# Patient Record
Sex: Male | Born: 1945
Health system: Southern US, Community
[De-identification: ages and names within clinical notes are randomized; demographics above are authoritative.]

## PROBLEM LIST (undated history)

## (undated) DIAGNOSIS — I10 Essential (primary) hypertension: Secondary | ICD-10-CM

## (undated) DIAGNOSIS — D446 Neoplasm of uncertain behavior of carotid body: Secondary | ICD-10-CM

## (undated) DIAGNOSIS — F32A Depression, unspecified: Secondary | ICD-10-CM

## (undated) DIAGNOSIS — E782 Mixed hyperlipidemia: Secondary | ICD-10-CM

## (undated) DIAGNOSIS — K219 Gastro-esophageal reflux disease without esophagitis: Secondary | ICD-10-CM

## (undated) DIAGNOSIS — F4310 Post-traumatic stress disorder, unspecified: Secondary | ICD-10-CM

## (undated) DIAGNOSIS — I2510 Atherosclerotic heart disease of native coronary artery without angina pectoris: Secondary | ICD-10-CM

## (undated) DIAGNOSIS — E119 Type 2 diabetes mellitus without complications: Secondary | ICD-10-CM

## (undated) DIAGNOSIS — F329 Major depressive disorder, single episode, unspecified: Secondary | ICD-10-CM

## (undated) DIAGNOSIS — K2270 Barrett's esophagus without dysplasia: Secondary | ICD-10-CM

## (undated) HISTORY — DX: Essential (primary) hypertension: I10

## (undated) HISTORY — DX: Atherosclerotic heart disease of native coronary artery without angina pectoris: I25.10

## (undated) HISTORY — DX: Gastro-esophageal reflux disease without esophagitis: K21.9

## (undated) HISTORY — DX: Mixed hyperlipidemia: E78.2

## (undated) HISTORY — DX: Neoplasm of uncertain behavior of carotid body: D44.6

## (undated) HISTORY — DX: Barrett's esophagus without dysplasia: K22.70

## (undated) HISTORY — PX: ORTHOPEDIC SURGERY: SHX850

## (undated) HISTORY — DX: Depression, unspecified: F32.A

## (undated) HISTORY — PX: ORIF ANKLE FRACTURE: SUR919

## (undated) HISTORY — PX: CARPAL TUNNEL RELEASE: SHX101

---
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
## 1999-11-08 ENCOUNTER — Encounter (INDEPENDENT_AMBULATORY_CARE_PROVIDER_SITE_OTHER): Payer: Self-pay | Admitting: Specialist

## 1999-11-08 ENCOUNTER — Other Ambulatory Visit: Admission: RE | Admit: 1999-11-08 | Discharge: 1999-11-08 | Payer: Self-pay | Admitting: Gastroenterology

## 2000-11-10 ENCOUNTER — Ambulatory Visit (HOSPITAL_COMMUNITY): Admission: RE | Admit: 2000-11-10 | Discharge: 2000-11-10 | Payer: Self-pay | Admitting: Orthopaedic Surgery

## 2001-05-18 ENCOUNTER — Other Ambulatory Visit: Admission: RE | Admit: 2001-05-18 | Discharge: 2001-05-18 | Payer: Self-pay | Admitting: Gastroenterology

## 2001-05-18 ENCOUNTER — Encounter (INDEPENDENT_AMBULATORY_CARE_PROVIDER_SITE_OTHER): Payer: Self-pay | Admitting: Specialist

## 2002-01-05 ENCOUNTER — Emergency Department (HOSPITAL_COMMUNITY): Admission: EM | Admit: 2002-01-05 | Discharge: 2002-01-05 | Payer: Self-pay | Admitting: Emergency Medicine

## 2003-11-28 ENCOUNTER — Encounter (INDEPENDENT_AMBULATORY_CARE_PROVIDER_SITE_OTHER): Payer: Self-pay | Admitting: *Deleted

## 2004-03-08 ENCOUNTER — Emergency Department (HOSPITAL_COMMUNITY): Admission: EM | Admit: 2004-03-08 | Discharge: 2004-03-08 | Payer: Self-pay | Admitting: Emergency Medicine

## 2006-09-10 ENCOUNTER — Emergency Department (HOSPITAL_COMMUNITY): Admission: EM | Admit: 2006-09-10 | Discharge: 2006-09-10 | Payer: Self-pay | Admitting: Emergency Medicine

## 2006-09-10 ENCOUNTER — Ambulatory Visit: Payer: Self-pay | Admitting: Psychiatry

## 2006-09-11 ENCOUNTER — Inpatient Hospital Stay (HOSPITAL_COMMUNITY): Admission: EM | Admit: 2006-09-11 | Discharge: 2006-09-18 | Payer: Self-pay | Admitting: Psychiatry

## 2006-09-12 ENCOUNTER — Encounter (HOSPITAL_COMMUNITY): Payer: Self-pay | Admitting: Psychiatry

## 2006-09-13 ENCOUNTER — Ambulatory Visit: Payer: Self-pay | Admitting: Vascular Surgery

## 2006-09-30 ENCOUNTER — Encounter (HOSPITAL_COMMUNITY): Admission: RE | Admit: 2006-09-30 | Discharge: 2006-10-30 | Payer: Self-pay | Admitting: Internal Medicine

## 2006-10-05 ENCOUNTER — Ambulatory Visit (HOSPITAL_COMMUNITY): Payer: Self-pay | Admitting: Psychology

## 2006-10-28 ENCOUNTER — Ambulatory Visit (HOSPITAL_COMMUNITY): Payer: Self-pay | Admitting: Psychology

## 2006-11-18 ENCOUNTER — Ambulatory Visit (HOSPITAL_COMMUNITY): Payer: Self-pay | Admitting: Psychology

## 2006-12-01 ENCOUNTER — Ambulatory Visit (HOSPITAL_COMMUNITY): Payer: Self-pay | Admitting: Psychiatry

## 2006-12-17 ENCOUNTER — Ambulatory Visit (HOSPITAL_COMMUNITY): Payer: Self-pay | Admitting: Psychology

## 2007-08-26 ENCOUNTER — Encounter: Payer: Self-pay | Admitting: Gastroenterology

## 2007-09-17 ENCOUNTER — Emergency Department (HOSPITAL_COMMUNITY): Admission: EM | Admit: 2007-09-17 | Discharge: 2007-09-18 | Payer: Self-pay | Admitting: Emergency Medicine

## 2008-02-09 ENCOUNTER — Ambulatory Visit (HOSPITAL_COMMUNITY)
Admission: RE | Admit: 2008-02-09 | Discharge: 2008-02-09 | Payer: Self-pay | Admitting: Adult Reconstructive Orthopaedic Surgery

## 2008-08-14 ENCOUNTER — Inpatient Hospital Stay (HOSPITAL_COMMUNITY): Admission: RE | Admit: 2008-08-14 | Discharge: 2008-08-18 | Payer: Self-pay | Admitting: Orthopedic Surgery

## 2010-08-20 NOTE — Procedures (Signed)
Summary: [facility]  [facility]   Imported By: Lester [place] 10/23/2009 10:14:42  _____________________________________________________________________  External Attachment:    Type:   Image     Comment:   External Document

## 2010-08-23 NOTE — Letter (Signed)
Summary: Recall/[facility]  Recall/[facility]   Imported By: Lester [place] 10/23/2009 10:16:45  _____________________________________________________________________  External Attachment:    Type:   Image     Comment:   External Document

## 2010-11-04 LAB — BASIC METABOLIC PANEL
BUN: 7 mg/dL (ref 6–23)
Calcium: 8.2 mg/dL — ABNORMAL LOW (ref 8.4–10.5)
Creatinine, Ser: 0.97 mg/dL (ref 0.4–1.5)
GFR calc non Af Amer: >60 mL/min (ref >60)
GFR calc non Af Amer: >60 mL/min (ref >60)
Glucose, Bld: 157 mg/dL — ABNORMAL HIGH (ref 70–99)
Potassium: 4.3 mEq/L (ref 3.5–5.1)
Potassium: 4.4 mEq/L (ref 3.5–5.1)

## 2010-11-04 LAB — CBC
HCT: 33.5 % — ABNORMAL LOW (ref 39.0–52.0)
Hemoglobin: 10.5 g/dL — ABNORMAL LOW (ref 13.0–17.0)
Hemoglobin: 10.6 g/dL — ABNORMAL LOW (ref 13.0–17.0)
Hemoglobin: 14 g/dL (ref 13.0–17.0)
MCHC: 33.4 g/dL (ref 30.0–36.0)
MCHC: 33.6 g/dL (ref 30.0–36.0)
MCV: 89.6 fL (ref 78.0–100.0)
MCV: 89.8 fL (ref 78.0–100.0)
MCV: 90.6 fL (ref 78.0–100.0)
Platelets: 159 10*3/uL (ref 150–400)
Platelets: 208 10*3/uL (ref 150–400)
RBC: 3.55 MIL/uL — ABNORMAL LOW (ref 4.22–5.81)
RDW: 12.8 % (ref 11.5–15.5)
RDW: 12.8 % (ref 11.5–15.5)
WBC: 5.7 10*3/uL (ref 4.0–10.5)
WBC: 8.6 10*3/uL (ref 4.0–10.5)

## 2010-11-04 LAB — GRAM STAIN

## 2010-11-04 LAB — URINALYSIS, ROUTINE W REFLEX MICROSCOPIC
Nitrite: NEGATIVE
Protein, ur: NEGATIVE mg/dL
pH: 7.5 (ref 5.0–8.0)

## 2010-11-04 LAB — PROTIME-INR
INR: 0.9 (ref 0.00–1.49)
INR: 2 — ABNORMAL HIGH (ref 0.00–1.49)
INR: 2.1 — ABNORMAL HIGH (ref 0.00–1.49)
Prothrombin Time: 14.4 seconds (ref 11.6–15.2)
Prothrombin Time: 23.8 seconds — ABNORMAL HIGH (ref 11.6–15.2)
Prothrombin Time: 25 seconds — ABNORMAL HIGH (ref 11.6–15.2)

## 2010-11-04 LAB — COMPREHENSIVE METABOLIC PANEL
AST: 23 U/L (ref 0–37)
Alkaline Phosphatase: 76 U/L (ref 39–117)
CO2: 28 mEq/L (ref 19–32)
Calcium: 9.6 mg/dL (ref 8.4–10.5)
Chloride: 106 mEq/L (ref 96–112)
Creatinine, Ser: 0.99 mg/dL (ref 0.4–1.5)
GFR calc Af Amer: >60 mL/min (ref >60)
GFR calc non Af Amer: >60 mL/min (ref >60)
Sodium: 141 mEq/L (ref 135–145)

## 2010-11-04 LAB — ABO/RH: ABO/RH(D): O POS

## 2010-11-04 LAB — TYPE AND SCREEN: ABO/RH(D): O POS

## 2010-11-04 LAB — ANAEROBIC CULTURE: Gram Stain: NONE SEEN

## 2010-11-04 LAB — BODY FLUID CULTURE: Gram Stain: NONE SEEN

## 2010-11-04 LAB — APTT: aPTT: 28 seconds (ref 24–37)

## 2010-12-03 NOTE — H&P (Signed)
[name], [name]                 ACCOUNT NO.:  [id number]   MEDICAL RECORD NO.:  [id number]          PATIENT TYPE:  INP   LOCATION:  NA                           FACILITY:  [facility]   PHYSICIAN:  Ollen Gross, M.D.    DATE OF BIRTH:  [birth year]   DATE OF ADMISSION:  08/14/2008  DATE OF DISCHARGE:                              HISTORY & PHYSICAL   DATE OF OFFICE VISIT:  July 17, 2008   CHIEF COMPLAINT:  Left knee pain.   HISTORY OF PRESENT ILLNESS:  The patient is 65 year old male who has  seen by Dr. Lequita Halt for ongoing left knee pain.  He has a previous total  knee.  He was seen as a second opinion.  He had total knee done about a  year and a half ago and [facility] [place].  He did fairly well the  first couple weeks and has started having increased pain and swelling  with therapy.  He saw Dr.  back in July and was told he has a loose  prosthesis.  He lives in the [place] area and wanted to be closer, so he  was seen in second opinion by Dr. Lequita Halt.  He was found to have  mechanical loosening but always possible low grade infection.  Risks and  benefits of the procedure have been discussed with the patient and it  was felt he would benefit from  undergoing revision arthroplasty.  Risks  and benefits discussed.  The patient subsequently hospital.   ALLERGIES:  NO KNOWN DRUG ALLERGIES.   CURRENT MEDICATIONS:  Omeprazole, lisinopril, Aleve, Claritin, and  multivitamin, omega-3 fish oil.   PAST MEDICAL HISTORY:  1. Hypertension.  2. Gastroesophageal reflux disease.  3. History of Barrett's esophagus.  4. Arthritis.  5. Childhood measles and mumps.   PAST SURGICAL HISTORY:  1. Left ankle.  2. Carpal tunnel.  3. Left total knee replacement January 2008   FAMILY HISTORY:  Father deceased at 30 with a cerebral hemorrhage.  Mother deceased age 21 with heart attack.   SOCIAL HISTORY:  Married, retired.  He worked in the [employer] for  17 years and also another  Associate Professor for 18 years.  Past  smoker.  No alcohol.  Wife will be assisting with care after surgery.  Lives in a [place] home.   REVIEW OF SYSTEMS:  GENERAL:  No fevers, chills, night sweats.  NEURO:  Limited tinnitus and no seizures, syncope or paralysis.  RESPIRATORY:  No shortness breath, productive cough or hemoptysis.  CARDIOVASCULAR:  No cheat pain.  GI: No nausea or constipation.  GU: No dysuria or  discharge.  MUSCULOSKELETAL:  Left knee.   PHYSICAL EXAMINATION:  VITAL SIGNS:  Pulse 84, respirations 14, blood  pressure 140/78.  GENERAL:  A 62-year white male well-nourished, well-developed, short-  statured, overweight, no acute distress.  HEENT: Normocephalic,  atraumatic.  Pupils are round and reactive.  Oropharynx clear.  EOMs  intact.  NECK:  Supple.  CHEST:  Clear.  HEART:  Regular rate and rhythm.  No murmur, S1 and S2 noted.  ABDOMEN:  Soft, nontender, slightly protuberant abdomen, round.  Bowel  sounds present.  RECTAL/BREASTS/GENITALIA:  Not done, not pertinent to present illness.  EXTREMITIES:  Left knee:  He has about 15-20 degree varus malalignment  deformity.  Tender more medial than lateral.  No instability.   IMPRESSION:  Mechanical loosening left total knee arthroplasty.   PLAN:  The patient was admitted to [facility] to undergo a  revision of the left total knee arthroplasty.  Surgery will be performed  by Ollen Gross.      Alexzandrew L. Perkins, P.A.C.      Ollen Gross, M.D.  Electronically Signed    ALP/MEDQ  D:  08/13/2008  T:  08/14/2008  Job:  [id number]   cc:   Kingsley Callander. Ouida Sills, MD  Fax: [phone]

## 2010-12-03 NOTE — Discharge Summary (Signed)
[name], [name]                 ACCOUNT NO.:  [id number]   MEDICAL RECORD NO.:  [id number]          PATIENT TYPE:  INP   LOCATION:  1618                         FACILITY:  [facility]   PHYSICIAN:  Ollen Gross, M.D.    DATE OF BIRTH:  May 09, 1946   DATE OF ADMISSION:  08/14/2008  DATE OF DISCHARGE:  08/18/2008                               DISCHARGE SUMMARY   ADDENDUM  To a prior to discharge summary. He apparently did not go yesterday, so  we had to do an addendum.   NEW DATE OF DISCHARGE:  August 18, 2008.   Originally we had the patient set up to go to a skilled nurse facility  of choice on August 17, 2008, however, it was noted that a bed was not  available.  Social services was still searching.  We had notification in  the chart that there should be a bed available today on August 18, 2008.  The patient was seen in rounds, doing well, no complaints, and  was ready to go today.   ADMITTING AND DISCHARGE DIAGNOSES:  Please see the previously dictated  summary.   DISCHARGE MEDS:  Please see the previously dictated summary.   CONDITION ON DISCHARGE:  Improving.      Alexzandrew L. Perkins, P.A.C.      Ollen Gross, M.D.  Electronically Signed    ALP/MEDQ  D:  08/18/2008  T:  08/18/2008  Job:  [id number]   cc:   Ollen Gross, M.D.  Fax: [phone]   Kingsley Callander. Ouida Sills, MD  Fax: [phone]

## 2010-12-03 NOTE — Discharge Summary (Signed)
[name], [name]                 ACCOUNT NO.:  [id number]   MEDICAL RECORD NO.:  [id number]          PATIENT TYPE:  INP   LOCATION:  1618                         FACILITY:  [facility]   PHYSICIAN:  Ollen Gross, M.D.    DATE OF BIRTH:  [birth year]   DATE OF ADMISSION:  08/14/2008  DATE OF DISCHARGE:  08/17/2008                               DISCHARGE SUMMARY   ADMITTING DIAGNOSES:  1. Mechanical loosening of left total knee.  2. Hypertension.  3. Gastroesophageal reflux disease.  4. History of Barrett's esophagus.  5. Childhood illnesses of measles and mumps.   DISCHARGE DIAGNOSES:  1. Loose left total knee arthroplasty status post revision left total      knee arthroplasty.  2. Mild postoperative blood loss anemia, did not require transfusion.  3. Hypertension.  4. Gastroesophageal reflux disease.  5. History of Barrett's esophagus.  6. Childhood illnesses of measles and mumps.   PROCEDURE:  August 14, 2008, left total knee arthroplasty revision.  Surgeon was Dr. Lequita Halt, assistant Avel Peace P.A.-C.  Spinal  anesthesia.   CONSULTS:  None.   BRIEF HISTORY:  [name] is a 65 year old male with a left total knee  done in [place] about a year ago.  He had an incident early in the  postoperative rehabilitation where he had significant pain.  He has gone  to have progressive worsening pain, x-rays showing collapse of the  tibial component to severe varus.  He was seen in a second opinion in  [place] and felt revision was needed.  He came to Dr. Lequita Halt for  another opinion and wanted to have it done closer home and admitted for  surgery.   LABORATORY DATA:  Preoperative CBC showed a hemoglobin of 14.0,  hematocrit of 42.2, white cell count 5.7, and platelets 208,000.  Chemistry panel on admission all within normal limits with the exception  of minimally elevated glucose of 113.  PT/INR 12.4 and 0.9 with a PTT of  28.  Blood group type O positive.  Preoperative UA negative.   Serial CBC  were followed throughout the hospital course.  Hemoglobin got down to  11.3 and drifted down 10.6.  Last H and H stabilized at 10.5 and 31.4.  Serial prothrombin times were followed per Coumadin protocol.  Last  PT/INR 23.8 and 2.0.  Serial basic metabolic panels were followed and  postoperative electrolytes remained within normal limits.  Cultures  taken at time of surgery.  Body fluid culture smear showed no organisms,  no growth on the culture x2 days.  Anaerobe culture smear showed no  organisms, no anaerobes isolated.  Stat Gram stain at the time of  surgery, no organisms, no WBC.   HOSPITAL COURSE:  The patient was admitted to [facility],  taken to the OR, and underwent the above-said procedure without  complication.  The patient tolerated the procedure well and was later  transferred to the recovery room and orthopedic floor, started on PCA  and p.o. analgesic pain control following surgery, given 24 hours  postoperative IV antibiotics.  Cultures were followed and at  the time of  dictation all the cultures remained negative.  He was on lisinopril  preoperatively.  This was held during the perioperative period.  He had  excellent urinary output on day one.  He was doing pretty well for a  major revision.  Hemoglobin looked good.  He started getting up out of  bed with therapy, walking short distance.  By day two, he was up walking  in the room and into the hallway.  Dressing was changed on day two,  incision looked good.  Due to issues with home, he wanted to look into a  skilled nursing facility.  We got social worker involved.  Hemoglobin  was 10.6 on day two.  Dressing change, incision was healing well.  His  blood pressure was running a little on the upper side, so we are going  to resume the lisinopril.  Prior to his discharge, he was weaned over to  p.o. medications.  PCA was discontinued on day two.  By day three, he  was doing well, hemoglobin  stabilized - it was 10.5, and pain was under  good control using p.o. medications.  We are waiting on a skilled  facility.  One had not been chosen at the time of dictation but we are  waiting on a final bed for transfer.   DISCHARGE/PLAN:  1. The patient was transferred over to skilled nursing facility of      choice.  2. Discharge diagnoses:      a.     Loose left total knee arthroplasty status post revision left       total knee arthroplasty.      b.     Mild postoperative blood loss anemia, did not require       transfusion.      c.     Hypertension.      d.     Gastroesophageal reflux disease.      e.     History of Barrett's esophagus.      f.     Childhood illnesses of measles and mumps.  3. Discharge medications:  Current medications include Colace 100 mg      p.o. b.i.d.; Coumadin protocol, please titrate Coumadin level for      target INR between 2.0 and 3.0, he needs to be on Coumadin for a      total of three weeks from the date of surgery, please see bottom of      dictation for his Coumadin regimen during the hospital course;      Prilosec 20 mg p.o. daily, Percocet 5 mg one or two every 4-6 hours      as needed for pain; Tylenol 325 one or two every 4-6 hours as      needed for mild pain, temperature, or headache; Robaxin 500 mg p.o.      every 6-8 hours p.r.n. spasm, he was taking Restoril 15-30 mg p.o.      q.h.s. p.r.n. sleep; will also resume his lisinopril 20 mg p.o.      q.a.m. and p.m., please monitor pressure, this was held during the      postoperative period but was resumed at the time of discharge.  4. Activity:  He is weightbearing as tolerated to the left lower      extremity.  Continue gait training ambulation, ADL, PT, and OT for      total knee protocol, range of motion and strengthening exercises.  He may start showering however do not submerge the incision under      water.  5. Follow-up:  He needs to follow up two weeks from the date of       surgery at Dr. Deri Fuelling office at the [facility] office of      [facility].  Contact the office at [phone] to      arrange appointment time and follow-up of this patient.   DISPOSITION:  Pending at the time of dictation.  We are waiting on final  bed offer.   CONDITION ON DISCHARGE:  Improving.      Alexzandrew L. Perkins, P.A.C.      Ollen Gross, M.D.  Electronically Signed    ALP/MEDQ  D:  08/17/2008  T:  08/17/2008  Job:  [id number]   cc:   Ollen Gross, M.D.  Fax: [phone]   Kingsley Callander. Ouida Sills, MD  Fax: [phone]

## 2010-12-03 NOTE — Discharge Summary (Signed)
[name], [name]                 ACCOUNT NO.:  [id number]   MEDICAL RECORD NO.:  [id number]          PATIENT TYPE:  INP   LOCATION:  1618                         FACILITY:  [facility]   PHYSICIAN:  Ollen Gross, M.D.    DATE OF BIRTH:  1946/05/26   DATE OF ADMISSION:  08/14/2008  DATE OF DISCHARGE:  08/17/2008                               DISCHARGE SUMMARY   ADDENDUM:   MEDICATION CHANGE:  Please note, we have changed his Percocet.   Please do the following - discontinue Percocet 5 mg.  Start Vicodin 5 mg  1-2 every 4-6 hours as needed for pain.      Alexzandrew L. Perkins, P.A.C.      Ollen Gross, M.D.  Electronically Signed    ALP/MEDQ  D:  08/17/2008  T:  08/17/2008  Job:  [id number]

## 2010-12-03 NOTE — Op Note (Signed)
[name], [name]                 ACCOUNT NO.:  [id number]   MEDICAL RECORD NO.:  [id number]          PATIENT TYPE:  INP   LOCATION:  0002                         FACILITY:  [facility]   PHYSICIAN:  Ollen Gross, M.D.    DATE OF BIRTH:  [birth year]   DATE OF PROCEDURE:  08/14/2008  DATE OF DISCHARGE:                               OPERATIVE REPORT   PREOPERATIVE DIAGNOSIS:  Loose left total knee arthroplasty.   POSTOPERATIVE DIAGNOSIS:  Loose left total knee arthroplasty.   PROCEDURE:  Left total knee arthroplasty revision.   SURGEON:  F. Aluisio, M.D.   ASSISTANT:  Avel Peace PA-C   ANESTHESIA:  Spinal.   ESTIMATED BLOOD LOSS:  About 300 mL.   DRAIN:  Hemovac times one.   TOURNIQUET TIME:  Up 77 minutes at 300 mmHg, down 8 minutes, up an  additional 19 minutes at 300 mmHg.   COMPLICATIONS:  None.   CONDITION ON DISCHARGE:  Stable to recovery.   BRIEF CLINICAL NOTE:  [name] is a 65 year old male who had a left  total knee arthroplasty at the [facility] over a year ago.  He said he had  an incident early in the postop rehab where he developed significant  pain in the knee.  He has gone on to have progressively worsening pain.  His x-rays have shown collapse of the tibial component to severe varus.  He had seen Dr. Larry Sierras for a second opinion in [place] who felt  the revision was needed.  [name] came to me for another opinion and I  concurred that he needed revision.  He wanted to get it done closer to  home and thus came back to me for the revision surgery.   PROCEDURE IN DETAIL:  After successful administration of spinal  anesthetic, a tourniquet was placed high on the left thigh and left  lower extremity and prepped and draped in the usual sterile fashion.  The extremity is wrapped in Esmarch, knee flexed, tourniquet inflated to  300 mmHg.  A midline incision is made with a 10 blade through  subcutaneous tissue to the level of the extensor mechanism.  A fresh  blade is used to make a medial parapatellar arthrotomy.  Minimal clear  fluid is encountered and it is sent for stat Gram stain C&S which was  negative.  The scar tissue is excised from underneath the extensor  mechanism and from the mediolateral gutters.  I was then able to sublux  the patella laterally.  Soft tissue of the proximal medial tibia is then  subperiosteally elevated around the joint line with a knife and at the  semimembranosus bursa with a Plyler elevator.  We were then able to flex  the 90 degrees.  I removed the tibial polyethylene.  This was an LCS  prosthesis.  I then disrupted the interface between the metal and bone.  This was done on the femoral side first.  It was a porous coated femur  so it had a very solid fit.  Fortunately, we were able to remove the  component with minimal,  if any, bone loss.  I then subluxed the tibia  forward.  The tibial component kicked into probably about 30 40 degrees  of varus.  I disrupted the interface between the component and bone and  the component is loose.  There is subsequently a large defect medially  since it collapsed down so far.  We removed the component.  The membrane  from the canal was then removed.  I thoroughly irrigated both the tibial  and femoral canals and then reamed on the tibial side for 13 mm and on  the femoral side up to 16 mm.  At that point, there was a good Press-Fit  on the femoral side.   The tibia subluxed forward and then the extramedullary alignment guide  is placed referencing proximally at the medial aspect of the tibial  tubercle and distally along the second metatarsal axis and tibial crest.  I pinned the block to remove about 2 mm from the non deficient lateral  side.  Tibial resection is made with an oscillating saw.  A size 4 is  the most appropriate tibial component. I prepared the proximal tibia  with the modular drill for the size 4.  I had to cut out a wedge cut on  the medial side as the  medial side was about 10 mm down from the  lateral.  With the trial size 4 BMT revision tray, I placed the wedge  cutting block and did a 10 mm cut, which barely skimmed the bone on the  medial side.  Once this was completed, I put a trial and size 4 MTB  revision tray with a 10-mm medial augment and no augment laterally.  This had very good fit on the tibia.  There was a little overhang on the  medial side so  I went with a size 3 augment which tapered down  perfectly and matched the contour of the tibia.   The 60-mm intramedullary reamer was placed on the femoral side.  I did  my distal femoral cut off that and 5 degrees of valgus.  There is a  little deficiency, so I got took the 4 mm position on each side and thus  we would do 4-mm medial and lateral distal augments.  The femur is  grossly internally rotated from the previous cut.  This would be  consistent with the initial tibial component being in varus and matching  the rotation with that.  I had then placed the size 5 AP cutting block  and rotated off the epicondylar axis.  This really made a big difference  compared to the original rotation.  With the size 5 in the +2 position  effectively raising the stem and lowering the prosthesis, we made our  resection anterior-posterior.  On the lateral side, I had to go into the  +8 position in order to get any bone at  all, thus we needed 8 mm  posterior lateral augment.  We then placed the intercondylar block and  did the intercondylar chamfer cuts.   The tibial trials were in already in place and we did the femoral trial  which was a size 5 TCL 3 femur with 4 mm mediolateral distal augments  and an 8 mm posterior augment with a 16 x 75 stem and a +2 position.  This had very good fit on the femur.  I put a 15 mm insert which allowed  for full extension with excellent varus-valgus anterior-posterior  balance throughout full range  of motion.  The LCS patella was then  removed first by  removing the plastic component from the metal and then  by disrupting the interface between the metal component and the bone.  There is minimal, if any, bone loss on the patella.  The thickness is 13  mm.  I cut it down to 11 mm to get a nice level cut.  A 41 template is  then placed, the lug holes are drilled, a trial patella is placed and it  tracks normally.  We then placed a moist sponge and released tourniquet  with a total time of 77 minutes.  While the tourniquet is down, the  components are assembled on the back table.  The components are the same  size and the trials are the same size augments.  The tourniquet was down  for a total of 8 minutes and I rewrapped the leg and reinflated to 300  mmHg.  The trials were removed.  I sized for the cement restricter which  was a 3.  A size 3 cement restricter was placed to the appropriate depth  in the tibial canal.  The bone was then irrigated with pulsatile lavage  and cement mixed.  Once ready for implantation, it is injected into the  tibial canal and pressurized.  The tibial component is cemented into  placed and then the femoral component cemented distally with a Press-Fit  stem and a 41 patella cemented into place and held with a clamp.  A  trial 15-mm insert is placed with the knee held in full extension and  all extruded cement removed.  When the cement is fully hardened, then  the permanent 15 mm TC3 posterior stabilized rotating platform insert is  placed into the tibial tray.  The wound is further irrigated and the  FloSeal injected in the posterior capsule, mediolateral gutters and  suprapatellar area.  A moist sponge is placed and tourniquet released  for a second time of 19 minutes.  A sponge is held for 2 minutes and  then removed.  The bleeding that is encountered is stopped with  electrocautery.  The arthrotomy is then closed over a Hemovac drain with  interrupted #1 Vicryl, subcu closed with  2-0 Vicryl and flexion  against gravity is about 120 degrees.  The drain  is then hooked to suction and the skin closed with staples.  A bulky  sterile dressing is applied and he is placed into a knee immobilizer,  awakened and transported to recovery in stable condition.      Ollen Gross, M.D.  Electronically Signed     FA/MEDQ  D:  08/14/2008  T:  08/14/2008  Job:  [id number]

## 2010-12-06 NOTE — Discharge Summary (Signed)
NAMEKAYNAN, FAIRBURN NO.:  [id number]   MEDICAL RECORD NO.:  [id number]          PATIENT TYPE:  IPS   LOCATION:  0506                          FACILITY:  [facility]   PHYSICIAN:  Geoffery Lyons, M.D.      DATE OF BIRTH:  1945-11-18   DATE OF ADMISSION:  09/11/2006  DATE OF DISCHARGE:  09/18/2006                               DISCHARGE SUMMARY   CHIEF COMPLAINT AND PRESENT ILLNESS:  This was the first admission to  [facility] for this 65 year old married white male  voluntarily admitted.  Brought to the ED by the wife, tearful, and he  stated wife felt she could no longer cope with his constant  restlessness, talking, confusion, tearfulness.  Onset 4 days postop  after knee replacement on August 20, 2006.  Disorganized, restless at  the [place] in [place], [place] and refused antidepressants.  He had also  stopped alcohol 4 months prior to this admission.   PAST PSYCHIATRIC HISTORY:  No previous treatment.  First time inpatient.   ALCOHOL AND DRUG HISTORY:  History of alcohol use to intoxication in the  past years.  Stopped 4 months ago.  No other substances.   MEDICAL HISTORY:  1. Hypertension.  2. Left knee replacement.   MEDICATIONS:  1. Valium 5 mg at night.  2. Lisinopril 10 mg per day.  3. Hydrocodone.  4. Lovenox 30 twice a day.  5. Hydrochlorothiazide 12.5 mg per day.  6. Omeprazole 20 mg per day.   PHYSICAL EXAM:  Performed.  Compatible with the surgery.   LABORATORY WORKUP:  Drug screen positive for opioids, benzodiazepines  that he has been prescribed.  CBC:  hemoglobin 10.7, white blood cells  4.7, sodium 134, potassium 4.1, glucose 128, BUN 22, creatinine 1.35.   MENTAL STATUS EXAM:  An alert, cooperative male, restless, anxious.  Affect tearful, somewhat labile, anxious.  Speech some pressure,  rambling, at times irrelevant.  Mood depressed.  Affect labile.  Thought  process circumstantial, at times disorganized, but no evidence of  delusional ideas.  No suicidal or homicidal ideas.  No hallucinations.  Some memory impairment, hard to assess.   ADMISSION DIAGNOSES:  AXIS I:  Major depressive disorder.  Rule out mood  disorder not otherwise specified.  AXIS II:  No diagnosis.  AXIS III:  Status post left knee replacement.  AXIS IV:  Moderate.  AXIS V:  Upon admission 35.  Highest global assessment of functioning in  the last year 70.   COURSE IN THE HOSPITAL:  He was admitted, started in individual and  group psychotherapy.  He was maintained on his medications.  We asked  internal medicine to assess him.  As already stated, 65 year old male,  married.  No children.  __________ endorsed that shortly after he had  the knee replaced and while under the influence of multiple medications  he broke down.  [place] that he remembered that he made a comment about  the hospital food and that a nurse responded that the prisoners of war  in another hall did not complain and ate  the same food.  Feels that this  event made him go back to [place].  Through the years admits he has  thought about [place] often and wondered about the people they left  there.  Endorsed guilt feelings.  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  Got employed for  29 years.  Eventually out on disability due to trauma to his left knee.  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  Expressed a lot of guilt over it.  When he starts  talking about [place], about wife's illness he starts crying.  Usually a  quiet man.  Now since this happened he keeps talking to his wife, can  not shut up.  Mood was depressed, anxious.  Affect was labile.  Ruminating about [place], the guilt, thinking about the people he left.  Guilt for having had to sell the farm.  We started working with  Risperdal, and he  was also started on Celexa.  He finally endorsed  that  he felt that he was depressed, and he was trying to keep himself going.  He continued to ruminate.  Was pretty circumstantial.  Had to be  redirected.  Still projecting.  Claimed that the wife was the one  needing some help.  Upset when talking about the relationship with his  wife.  Not sleeping too well.  The roommate had reported that he says up  and talking most of the night.  Risperdal had been increased.  The  family session on February 24, wife endorsed that he initially was  confused and very emotional.  February 26, he endorsed that he might be  less anxious; worried still.  Did not sleep too well.  Hard time keeping  his leg comfortable.  Would like to pursue medication further.  Still  worried, afraid.  He was seen by physical therapy which started helping  with his knee.  By the 28th he was better.  Sleep also improved, and by  February 29 he was in full contact with reality.  His mood had improved.  His affect was bright, broad.  He was committed to pursue further  treatment.  Was going to be followed up on an outpatient basis.  Continue the medication, at the same time go through the [place] to have some  help for the symptoms of PTSD that he has been experiencing; but  endorsed no active suicidal/homicidal ideas.  No hallucinations, no  delusions.   DISCHARGE DIAGNOSES:  AXIS I:  Major depressive disorder.  Posttraumatic  stress disorder.  AXIS II:  No diagnosis.  AXIS III:  Status post left knee replacement, arterial hypertension.  AXIS IV:  Moderate.  AXIS V:  Upon discharge 60.   DISCHARGE MEDICATIONS:  1. Protonix 40 mg per day.  2. Hydrochlorothiazide 12.5 mg per day.  3. Prinivil 20 mg twice a day.  4. Celexa 20 mg per day.  5. Colace 100 twice a day.  6. Risperdal 0.25 twice a day.  7. Risperdal 0.5 at night.  8. Lovenox 30 mg injection twice a day.  9. Trazodone 100 one to two at night. 10.Narco 5/325 two  every 6 hours as needed.  11.Ativan 1 mg every 6 hours a needed for anxiety.  12.MiraLax 17 g every day.  FOLLOWUP:  Dr. Abbey Chatters and Dr. Lolly Mustache at the [facility].      Geoffery Lyons, M.D.  Electronically Signed     IL/MEDQ  D:  10/12/2006  T:  10/13/2006  Job:  [id number]

## 2010-12-06 NOTE — Consult Note (Signed)
[name], [name]                 ACCOUNT NO.:  [id number]   MEDICAL RECORD NO.:  [id number]          PATIENT TYPE:  IPS   LOCATION:  0506                          FACILITY:  [facility]   PHYSICIAN:  Hollice Espy, M.D.DATE OF BIRTH:  04-06-1946   DATE OF CONSULTATION:  09/11/2006  DATE OF DISCHARGE:                                 CONSULTATION   STAT CONSULTATION   PRIMARY CARE PHYSICIAN:  A doctor in [place].   REASON FOR CONSULTATION:  Evaluation of confusion.   HISTORY OF PRESENT ILLNESS:  The patient is a 65 year old white male  with a past medical history of hypertension, GERD, and a recent total  knee replacement done about three weeks prior at the [place], [place].  Reports are that patient post procedure was having problems with  confusion and rumination, agitation, and was sent home.  The patient did  not have a CT scan done, although he had a reported Psych evaluation  done, which was a question of agitation as per the Psychiatry  evaluation, but possibly posttraumatic stress versus depression versus  alcohol withdrawal versus stroke.  The patient did not get a CT scan  done and was discharged home.  He continued to have issues of confusion,  night wanderings, frequent speech, and flashbacks to his time in  [place], that wife became concerned and brought him here for further  evaluation; the patient was admitted, [facility] Hospitalists were consulted  for medical evaluation.  In discussion with the patient, patient appears  to be relatively alert and oriented, he answers my questions  appropriately, he knows what the date is as well as who the President  is, and he sometimes has more of a rambling speech and he does  frequently talk.  His thought pattern is consistent, although again he  does report more increased activity in his speech, who tells me at times  that he does get quite stressed and cries frequently, something which is  new for him, and he thinks about  his time in [place].  He reports no  problems with weakness other than in his leg status post his knee  replacement as well as some increased pain as of late in the left leg  below the knee.  He otherwise is doing well.  He denies any problems  with chest pain, shortness of breath, no headaches, vision changes,  dysphagia, palpitations, wheezing, coughing, no abdominal pain, no  hematuria, no dysuria, no constipation or diarrhea, no focal extremity  numbness, weakness, or pain other than what is described.  The review of  systems is otherwise negative.   PAST MEDICAL HISTORY:  1. Multiple left leg injuries.  2. Status post an ankle repair.  3. Status post a recent left total knee.  4. History of hypertension.  5. GERD.  6. He denies any alcohol use and abuse, but apparently he used to      drink heavily.  7. He also used to smoke but gave that up.   HOME MEDICATIONS:  1. Valium 5 q.h.s.  2. Lisinopril 20 daily.  3.  Vicodin.  4. Lovenox 30 b.i.d.  The patient was not sent home on Coumadin, was      sent home on continuous Lovenox for several weeks.  5. Dilaudid p.r.n.  6. Ativan p.r.n.  7. HCTZ and Omeprazole.   ALLERGIES:  MORPHINE.   SOCIAL HISTORY:  He denies any drug use.  Recently gave up smoking and  gave up alcohol.   FAMILY HISTORY:  Noncontributory.   PHYSICAL EXAMINATION:  VITAL SIGNS:  Patient's most recent set of  vitals:  Temp 97.9, respirations 20, blood pressure 172/70, heart rate  is 118, although when I evaluated the patient he was not tachycardic.  GENERAL:  The patient is alert and oriented x3 in no apparent distress.  HEENT:  Normocephalic, atraumatic.  His mucous membranes are moist.  NECK:  He has no carotid bruits.  HEART:  Regular rate and rhythm, S1 and S2.  LUNGS:  Clear to auscultation bilaterally.  ABDOMEN:  Soft, nontender, and nondistended, positive bowel sounds.  EXTREMITIES:  His left leg shows evidence of recent left knee  replacement.   The wound has Steri-Strips, but his wounds are scabbed  over.  He has 1+ pulses in both legs.   LABORATORY DATA:  Urine drug screen is positive for opiates and  benzodiazepines, which is to be expected given his previous medications.  Alcohol level is normal.  White count 4.7, H&H 10.7 and 31.3, MCV is 87,  platelet count 487, no shift.  Sodium 134, potassium 4.1, chloride 99,  bicarb 27, BUN 22, creatinine 1.3, glucose 128.   ASSESSMENT AND PLAN:  1. Mental status changes.  At first, I was concerned about the      possibility of alcohol withdrawal, which still may be a possibility      in this patient or a fat emboli given his recent surgery.  I doubt      very much he has had a stroke or a fat emboli to the brain given      the fact of his relative alert and oriented status, and the fact      that he is able to communicate properly.  He has no focal      neurological deficits in my exam.  Nevertheless, we will still      check a CT scan of the head to rule out this possibility, but      likely that he is having some agitation possibly from alcohol      withdrawal plus underlying depression and/or other mood disorder,      which does need treatment with counseling and possible medications      as well.  Will defer, of course, those treatments to [facility].  If there are no findings on his CAT scan, there is no      other further medical workup needed, and this is more of a      psychiatric component.  2. Hypertension.  Will continue the patient's medications, watch, with      additional medication agents may be needed plus medication for      possible alcohol withdrawal.  3. History of alcohol abuse.  Continue to monitor.  4. History of reflux.  Continue Omeprazole.  5. Renal insufficiency likely secondary to years of hypertension.      Hollice Espy, M.D.  Electronically Signed    SKK/MEDQ  D:  09/11/2006  T:  09/11/2006  Job:  [id number]

## 2011-04-11 LAB — URINALYSIS, ROUTINE W REFLEX MICROSCOPIC
Nitrite: NEGATIVE
Specific Gravity, Urine: >1.03 — ABNORMAL HIGH
Urobilinogen, UA: 0.2

## 2011-04-11 LAB — CBC
HCT: 39.7
Hemoglobin: 13.5
MCHC: 34.1
MCV: 86.8
RBC: 4.57
WBC: 7.1

## 2011-04-11 LAB — URINE MICROSCOPIC-ADD ON

## 2011-04-11 LAB — BASIC METABOLIC PANEL
CO2: 28
Chloride: 103
Creatinine, Ser: 1.05
GFR calc Af Amer: >60
Potassium: 4
Sodium: 139

## 2011-04-11 LAB — DIFFERENTIAL
Eosinophils Absolute: 0.1
Eosinophils Relative: 2
Lymphocytes Relative: 30
Lymphs Abs: 2.2
Monocytes Relative: 6

## 2013-08-06 ENCOUNTER — Emergency Department (HOSPITAL_COMMUNITY)
Admission: EM | Admit: 2013-08-06 | Discharge: 2013-08-06 | Disposition: A | Discharge status: Home / Self Care | Payer: Medicare Other | Attending: Emergency Medicine | Admitting: Emergency Medicine

## 2013-08-06 ENCOUNTER — Encounter (HOSPITAL_COMMUNITY): Payer: Self-pay | Admitting: Emergency Medicine

## 2013-08-06 DIAGNOSIS — K219 Gastro-esophageal reflux disease without esophagitis: Secondary | ICD-10-CM

## 2013-08-06 DIAGNOSIS — Y929 Unspecified place or not applicable: Secondary | ICD-10-CM | POA: Insufficient documentation

## 2013-08-06 DIAGNOSIS — S8990XA Unspecified injury of unspecified lower leg, initial encounter: Secondary | ICD-10-CM | POA: Insufficient documentation

## 2013-08-06 DIAGNOSIS — W540XXA Bitten by dog, initial encounter: Secondary | ICD-10-CM | POA: Insufficient documentation

## 2013-08-06 DIAGNOSIS — I10 Essential (primary) hypertension: Secondary | ICD-10-CM | POA: Insufficient documentation

## 2013-08-06 DIAGNOSIS — S61409A Unspecified open wound of unspecified hand, initial encounter: Secondary | ICD-10-CM | POA: Insufficient documentation

## 2013-08-06 DIAGNOSIS — S81851A Open bite, right lower leg, initial encounter: Secondary | ICD-10-CM

## 2013-08-06 DIAGNOSIS — IMO0002 Reserved for concepts with insufficient information to code with codable children: Secondary | ICD-10-CM | POA: Insufficient documentation

## 2013-08-06 DIAGNOSIS — S99929A Unspecified injury of unspecified foot, initial encounter: Secondary | ICD-10-CM

## 2013-08-06 DIAGNOSIS — S61452A Open bite of left hand, initial encounter: Secondary | ICD-10-CM

## 2013-08-06 DIAGNOSIS — Y939 Activity, unspecified: Secondary | ICD-10-CM | POA: Insufficient documentation

## 2013-08-06 DIAGNOSIS — S61209A Unspecified open wound of unspecified finger without damage to nail, initial encounter: Secondary | ICD-10-CM | POA: Insufficient documentation

## 2013-08-06 DIAGNOSIS — S99919A Unspecified injury of unspecified ankle, initial encounter: Secondary | ICD-10-CM

## 2013-08-06 HISTORY — DX: Gastro-esophageal reflux disease without esophagitis: K21.9

## 2013-08-06 HISTORY — DX: Essential (primary) hypertension: I10

## 2013-08-06 MED ORDER — AMOXICILLIN-POT CLAVULANATE 875-125 MG PO TABS: 1.0000 | ORAL_TABLET | Freq: Two times a day (BID) | ORAL | Status: DC

## 2013-08-06 MED ORDER — AMOXICILLIN-POT CLAVULANATE 875-125 MG PO TABS
1.0000 | ORAL_TABLET | Freq: Two times a day (BID) | ORAL | Status: DC
  Administered 2013-08-06: 1 via ORAL
  Filled 2013-08-06: qty 1

## 2013-08-06 NOTE — Discharge Instructions (Signed)

## 2013-08-06 NOTE — ED Notes (Signed)
Bitten by a dog , laceration to left hand ring finger

## 2013-08-06 NOTE — ED Provider Notes (Signed)
CSN: [id number]     Arrival date & time 08/06/13  1517 History   First MD Initiated Contact with Patient 08/06/13 1606     Chief Complaint  Patient presents with  . Animal Bite   (Consider location/radiation/quality/duration/timing/severity/associated sxs/prior Treatment) Patient is a 68 y.o. male presenting with animal bite. The history is provided by the patient. No language interpreter was used.  Animal Bite Contact animal:  Dog Location:  Hand and leg Hand injury location:  L hand Leg injury location:  R leg Pain details:    Quality:  Aching   Severity:  Mild   Timing:  Constant   Progression:  Worsening Provoked: provoked   Notifications:  None Animal in possession: no   Relieved by:  Nothing Worsened by:  Nothing tried Ineffective treatments:  None tried Pt complains of dog bite to left hand.   Pt also has a bite to right leg  Past Medical History  Diagnosis Date  . Hypertension   . Acid reflux    Past Surgical History  Procedure Laterality Date  . Orthopedic surgery     No family history on file. History  Substance Use Topics  . Smoking status: Not on file  . Smokeless tobacco: Not on file  . Alcohol Use: Not on file    Review of Systems  All other systems reviewed and are negative.    Allergies  Review of patient's allergies indicates no known allergies.  Home Medications  No current outpatient prescriptions on file. BP 146/61  Pulse 75  Temp(Src) 98.5 F (36.9 C) (Oral)  Resp 18  Ht 5\' 5"  (1.651 m)  Wt 207 lb (93.895 kg)  BMI 34.45 kg/m2  SpO2 92% Physical Exam  Nursing note and vitals reviewed. Constitutional: He appears well-developed and well-nourished.  HENT:  Head: Normocephalic and atraumatic.  Eyes: Conjunctivae are normal. Pupils are equal, round, and reactive to light.  Pulmonary/Chest: Effort normal.  Musculoskeletal: He exhibits tenderness.  3cm abrasion right hand  Neurological: He is alert.  Skin: Skin is warm.  Left hand   Superficial laceration lateral hand,  1.5 cm laceration to left ring finger  Psychiatric: He has a normal mood and affect.    ED Course  Procedures (including critical care time) Labs Review Labs Reviewed - No data to display Imaging Review No results found.  EKG Interpretation   None       MDM   1. Acid reflux   2. Dog bite of left hand   3. Dog bite of right lower leg    augmentin   [place], [place] 08/07/13 [phone]

## 2013-08-08 NOTE — ED Provider Notes (Signed)
Medical screening examination/treatment/procedure(s) were performed by non-physician practitioner and as supervising physician I was immediately available for consultation/collaboration.  EKG Interpretation   None        Virgel Manifold, MD 08/08/13 2230

## 2014-10-03 DIAGNOSIS — D492 Neoplasm of unspecified behavior of bone, soft tissue, and skin: Secondary | ICD-10-CM | POA: Diagnosis not present

## 2014-10-03 DIAGNOSIS — R221 Localized swelling, mass and lump, neck: Secondary | ICD-10-CM | POA: Diagnosis not present

## 2014-10-03 DIAGNOSIS — D1809 Hemangioma of other sites: Secondary | ICD-10-CM | POA: Diagnosis not present

## 2014-10-23 DIAGNOSIS — H903 Sensorineural hearing loss, bilateral: Secondary | ICD-10-CM | POA: Diagnosis not present

## 2014-10-23 DIAGNOSIS — D446 Neoplasm of uncertain behavior of carotid body: Secondary | ICD-10-CM | POA: Diagnosis not present

## 2014-10-23 DIAGNOSIS — H6120 Impacted cerumen, unspecified ear: Secondary | ICD-10-CM | POA: Diagnosis not present

## 2015-04-30 DIAGNOSIS — H9012 Conductive hearing loss, unilateral, left ear, with unrestricted hearing on the contralateral side: Secondary | ICD-10-CM | POA: Diagnosis not present

## 2015-04-30 DIAGNOSIS — H6123 Impacted cerumen, bilateral: Secondary | ICD-10-CM | POA: Diagnosis not present

## 2015-12-07 DIAGNOSIS — H6122 Impacted cerumen, left ear: Secondary | ICD-10-CM | POA: Diagnosis not present

## 2016-02-28 ENCOUNTER — Other Ambulatory Visit (HOSPITAL_COMMUNITY): Payer: Self-pay | Admitting: Otolaryngology

## 2016-02-28 DIAGNOSIS — C754 Malignant neoplasm of carotid body: Secondary | ICD-10-CM

## 2017-06-03 DIAGNOSIS — M7531 Calcific tendinitis of right shoulder: Secondary | ICD-10-CM | POA: Diagnosis not present

## 2017-06-03 DIAGNOSIS — M25511 Pain in right shoulder: Secondary | ICD-10-CM | POA: Diagnosis not present

## 2017-06-15 DIAGNOSIS — I10 Essential (primary) hypertension: Secondary | ICD-10-CM | POA: Diagnosis not present

## 2017-06-26 DIAGNOSIS — K219 Gastro-esophageal reflux disease without esophagitis: Secondary | ICD-10-CM | POA: Diagnosis not present

## 2017-06-26 DIAGNOSIS — I10 Essential (primary) hypertension: Secondary | ICD-10-CM | POA: Diagnosis not present

## 2017-06-26 DIAGNOSIS — E782 Mixed hyperlipidemia: Secondary | ICD-10-CM | POA: Diagnosis not present

## 2017-07-01 DIAGNOSIS — Z0001 Encounter for general adult medical examination with abnormal findings: Secondary | ICD-10-CM | POA: Diagnosis not present

## 2017-07-01 DIAGNOSIS — K219 Gastro-esophageal reflux disease without esophagitis: Secondary | ICD-10-CM | POA: Diagnosis not present

## 2017-07-16 DIAGNOSIS — M7531 Calcific tendinitis of right shoulder: Secondary | ICD-10-CM | POA: Diagnosis not present

## 2017-07-16 DIAGNOSIS — M25511 Pain in right shoulder: Secondary | ICD-10-CM | POA: Diagnosis not present

## 2017-07-20 DIAGNOSIS — J069 Acute upper respiratory infection, unspecified: Secondary | ICD-10-CM | POA: Diagnosis not present

## 2017-08-31 DIAGNOSIS — M7541 Impingement syndrome of right shoulder: Secondary | ICD-10-CM | POA: Diagnosis not present

## 2017-08-31 DIAGNOSIS — M7531 Calcific tendinitis of right shoulder: Secondary | ICD-10-CM | POA: Diagnosis not present

## 2018-01-29 DIAGNOSIS — M6530 Trigger finger, unspecified finger: Secondary | ICD-10-CM | POA: Diagnosis not present

## 2018-01-29 DIAGNOSIS — M7541 Impingement syndrome of right shoulder: Secondary | ICD-10-CM | POA: Diagnosis not present

## 2018-02-05 DIAGNOSIS — M79621 Pain in right upper arm: Secondary | ICD-10-CM | POA: Diagnosis not present

## 2018-02-05 DIAGNOSIS — M25511 Pain in right shoulder: Secondary | ICD-10-CM | POA: Diagnosis not present

## 2018-02-05 DIAGNOSIS — M25611 Stiffness of right shoulder, not elsewhere classified: Secondary | ICD-10-CM | POA: Diagnosis not present

## 2018-02-05 DIAGNOSIS — M62511 Muscle wasting and atrophy, not elsewhere classified, right shoulder: Secondary | ICD-10-CM | POA: Diagnosis not present

## 2018-02-08 DIAGNOSIS — M62511 Muscle wasting and atrophy, not elsewhere classified, right shoulder: Secondary | ICD-10-CM | POA: Diagnosis not present

## 2018-02-08 DIAGNOSIS — M25611 Stiffness of right shoulder, not elsewhere classified: Secondary | ICD-10-CM | POA: Diagnosis not present

## 2018-02-08 DIAGNOSIS — M79621 Pain in right upper arm: Secondary | ICD-10-CM | POA: Diagnosis not present

## 2018-02-08 DIAGNOSIS — M25511 Pain in right shoulder: Secondary | ICD-10-CM | POA: Diagnosis not present

## 2018-02-09 DIAGNOSIS — M25511 Pain in right shoulder: Secondary | ICD-10-CM | POA: Diagnosis not present

## 2018-02-09 DIAGNOSIS — M79621 Pain in right upper arm: Secondary | ICD-10-CM | POA: Diagnosis not present

## 2018-02-09 DIAGNOSIS — M62511 Muscle wasting and atrophy, not elsewhere classified, right shoulder: Secondary | ICD-10-CM | POA: Diagnosis not present

## 2018-02-09 DIAGNOSIS — M25611 Stiffness of right shoulder, not elsewhere classified: Secondary | ICD-10-CM | POA: Diagnosis not present

## 2018-02-12 DIAGNOSIS — M62511 Muscle wasting and atrophy, not elsewhere classified, right shoulder: Secondary | ICD-10-CM | POA: Diagnosis not present

## 2018-02-12 DIAGNOSIS — M25511 Pain in right shoulder: Secondary | ICD-10-CM | POA: Diagnosis not present

## 2018-02-12 DIAGNOSIS — M79621 Pain in right upper arm: Secondary | ICD-10-CM | POA: Diagnosis not present

## 2018-02-12 DIAGNOSIS — M25611 Stiffness of right shoulder, not elsewhere classified: Secondary | ICD-10-CM | POA: Diagnosis not present

## 2018-02-17 DIAGNOSIS — M25511 Pain in right shoulder: Secondary | ICD-10-CM | POA: Diagnosis not present

## 2018-02-17 DIAGNOSIS — M62511 Muscle wasting and atrophy, not elsewhere classified, right shoulder: Secondary | ICD-10-CM | POA: Diagnosis not present

## 2018-02-17 DIAGNOSIS — M79621 Pain in right upper arm: Secondary | ICD-10-CM | POA: Diagnosis not present

## 2018-02-17 DIAGNOSIS — M25611 Stiffness of right shoulder, not elsewhere classified: Secondary | ICD-10-CM | POA: Diagnosis not present

## 2018-02-18 DIAGNOSIS — M62511 Muscle wasting and atrophy, not elsewhere classified, right shoulder: Secondary | ICD-10-CM | POA: Diagnosis not present

## 2018-02-18 DIAGNOSIS — M25611 Stiffness of right shoulder, not elsewhere classified: Secondary | ICD-10-CM | POA: Diagnosis not present

## 2018-02-18 DIAGNOSIS — M25511 Pain in right shoulder: Secondary | ICD-10-CM | POA: Diagnosis not present

## 2018-02-18 DIAGNOSIS — M79621 Pain in right upper arm: Secondary | ICD-10-CM | POA: Diagnosis not present

## 2018-02-19 DIAGNOSIS — M79621 Pain in right upper arm: Secondary | ICD-10-CM | POA: Diagnosis not present

## 2018-02-19 DIAGNOSIS — M62511 Muscle wasting and atrophy, not elsewhere classified, right shoulder: Secondary | ICD-10-CM | POA: Diagnosis not present

## 2018-02-19 DIAGNOSIS — M25611 Stiffness of right shoulder, not elsewhere classified: Secondary | ICD-10-CM | POA: Diagnosis not present

## 2018-02-19 DIAGNOSIS — M25511 Pain in right shoulder: Secondary | ICD-10-CM | POA: Diagnosis not present

## 2018-05-20 DIAGNOSIS — L723 Sebaceous cyst: Secondary | ICD-10-CM | POA: Diagnosis not present

## 2018-05-27 DIAGNOSIS — J069 Acute upper respiratory infection, unspecified: Secondary | ICD-10-CM | POA: Diagnosis not present

## 2018-05-27 DIAGNOSIS — E782 Mixed hyperlipidemia: Secondary | ICD-10-CM | POA: Diagnosis not present

## 2018-05-27 DIAGNOSIS — L0390 Cellulitis, unspecified: Secondary | ICD-10-CM | POA: Diagnosis not present

## 2018-05-27 DIAGNOSIS — K219 Gastro-esophageal reflux disease without esophagitis: Secondary | ICD-10-CM | POA: Diagnosis not present

## 2018-05-27 DIAGNOSIS — I10 Essential (primary) hypertension: Secondary | ICD-10-CM | POA: Diagnosis not present

## 2018-06-29 DIAGNOSIS — L0390 Cellulitis, unspecified: Secondary | ICD-10-CM | POA: Diagnosis not present

## 2018-06-29 DIAGNOSIS — I10 Essential (primary) hypertension: Secondary | ICD-10-CM | POA: Diagnosis not present

## 2018-06-29 DIAGNOSIS — E782 Mixed hyperlipidemia: Secondary | ICD-10-CM | POA: Diagnosis not present

## 2018-07-02 DIAGNOSIS — Z0001 Encounter for general adult medical examination with abnormal findings: Secondary | ICD-10-CM | POA: Diagnosis not present

## 2018-07-02 DIAGNOSIS — H6123 Impacted cerumen, bilateral: Secondary | ICD-10-CM | POA: Diagnosis not present

## 2018-07-02 DIAGNOSIS — E782 Mixed hyperlipidemia: Secondary | ICD-10-CM | POA: Diagnosis not present

## 2018-07-02 DIAGNOSIS — K219 Gastro-esophageal reflux disease without esophagitis: Secondary | ICD-10-CM | POA: Diagnosis not present

## 2018-07-02 DIAGNOSIS — R030 Elevated blood-pressure reading, without diagnosis of hypertension: Secondary | ICD-10-CM | POA: Diagnosis not present

## 2018-08-02 DIAGNOSIS — L821 Other seborrheic keratosis: Secondary | ICD-10-CM | POA: Diagnosis not present

## 2018-08-02 DIAGNOSIS — L820 Inflamed seborrheic keratosis: Secondary | ICD-10-CM | POA: Diagnosis not present

## 2018-08-02 DIAGNOSIS — X32XXXA Exposure to sunlight, initial encounter: Secondary | ICD-10-CM | POA: Diagnosis not present

## 2018-08-02 DIAGNOSIS — L720 Epidermal cyst: Secondary | ICD-10-CM | POA: Diagnosis not present

## 2018-08-02 DIAGNOSIS — D170 Benign lipomatous neoplasm of skin and subcutaneous tissue of head, face and neck: Secondary | ICD-10-CM | POA: Diagnosis not present

## 2018-08-02 DIAGNOSIS — L570 Actinic keratosis: Secondary | ICD-10-CM | POA: Diagnosis not present

## 2018-08-03 ENCOUNTER — Ambulatory Visit (INDEPENDENT_AMBULATORY_CARE_PROVIDER_SITE_OTHER): Payer: Medicare Other

## 2018-08-03 ENCOUNTER — Ambulatory Visit: Payer: Medicare Other | Admitting: Orthopaedic Surgery

## 2018-08-03 ENCOUNTER — Encounter: Payer: Self-pay | Admitting: Orthopaedic Surgery

## 2018-08-03 VITALS — BP 159/80 | HR 88 | Ht 65.0 in | Wt 206.0 lb

## 2018-08-03 DIAGNOSIS — M79662 Pain in left lower leg: Secondary | ICD-10-CM

## 2018-08-03 DIAGNOSIS — M25572 Pain in left ankle and joints of left foot: Secondary | ICD-10-CM

## 2018-08-03 DIAGNOSIS — G8929 Other chronic pain: Secondary | ICD-10-CM

## 2018-08-03 NOTE — Progress Notes (Signed)
Subjective:    Patient ID: [name], male    DOB: 1946-05-06, 73 y.o.   MRN: [id number]  HPI He had a severe injury to the left lower leg in the early 1980's and had surgery on the left ankle.  He has an old healed fracture of the distal tibia and fibula.  He has had total knee, hinged, done on the left.    His wife is ill.  She was in the hospital here for a week and then transferred to [facility] for intestine obstructions.  She is home but is very weak.  He is the care giver.  He had to walk a lot at [facility] from the parking deck to her room and so forth.  He has walked more in the last few weeks he says than in the last few years.  He has developed left ankle pain and distal tibial pain.  He has no direct trauma, no redness.  He has used ice which helps and Advil just once a day.   Review of Systems  Constitutional: Positive for activity change.  Musculoskeletal: Positive for arthralgias, gait problem and joint swelling.  All other systems reviewed and are negative.  For Review of Systems, all other systems reviewed and are negative.  The following is a summary of the past history medically, past history surgically, known current medicines, social history and family history.  This information is gathered electronically by the computer from prior information and documentation.  I review this each visit and have found including this information at this point in the chart is beneficial and informative.   Past Medical History:  Diagnosis Date  . Acid reflux   . Hypertension     Past Surgical History:  Procedure Laterality Date  . ORTHOPEDIC SURGERY      No current outpatient medications on file prior to visit.   No current facility-administered medications on file prior to visit.     Social History   Socioeconomic History  . Marital status: Married    Spouse name: Not on file  . Number of children: Not on file  . Years of education: Not on file  . Highest  education level: Not on file  Occupational History  . Not on file  Social Needs  . Financial resource strain: Not on file  . Food insecurity:    Worry: Not on file    Inability: Not on file  . Transportation needs:    Medical: Not on file    Non-medical: Not on file  Tobacco Use  . Smoking status: Former Games developer  . Smokeless tobacco: Never Used  Substance and Sexual Activity  . Alcohol use: Never    Frequency: Never  . Drug use: Not on file  . Sexual activity: Not on file  Lifestyle  . Physical activity:    Days per week: Not on file    Minutes per session: Not on file  . Stress: Not on file  Relationships  . Social connections:    Talks on phone: Not on file    Gets together: Not on file    Attends religious service: Not on file    Active member of club or organization: Not on file    Attends meetings of clubs or organizations: Not on file    Relationship status: Not on file  . Intimate partner violence:    Fear of current or ex partner: Not on file    Emotionally abused: Not on file  Physically abused: Not on file    Forced sexual activity: Not on file  Other Topics Concern  . Not on file  Social History Narrative  . Not on file    Family History  Problem Relation Age of Onset  . CVA Father     BP (!) 159/80   Pulse 88   Ht 5\' 5"  (1.651 m)   Wt 206 lb (93.4 kg)   BMI 34.28 kg/m   Body mass index is 34.28 kg/m.     Objective:   Physical Exam Constitutional:      Appearance: He is well-developed.  HENT:     Head: Normocephalic and atraumatic.  Eyes:     Conjunctiva/sclera: Conjunctivae normal.     Pupils: Pupils are equal, round, and reactive to light.  Neck:     Musculoskeletal: Normal range of motion and neck supple.  Cardiovascular:     Rate and Rhythm: Normal rate and regular rhythm.  Pulmonary:     Effort: Pulmonary effort is normal.  Abdominal:     Palpations: Abdomen is soft.  Musculoskeletal:       Legs:  Skin:    General: Skin  is warm and dry.  Neurological:     Mental Status: He is alert and oriented to person, place, and time.     Cranial Nerves: No cranial nerve deficit.     Motor: No abnormal muscle tone.     Coordination: Coordination normal.     Deep Tendon Reflexes: Reflexes are normal and symmetric. Reflexes normal.  Psychiatric:        Behavior: Behavior normal.        Thought Content: Thought content normal.        Judgment: Judgment normal.       X-rays were done of the left tibia and left ankle, reported separately.  DJD of ankle is present.    Assessment & Plan:   Encounter Diagnoses  Name Primary?  . Chronic pain of left ankle Yes  . Pain in left lower leg    I have told him to use ice and increase dose of Advil.  He can use Aspercreme as well.  He may need PT.  Return in two weeks.  Call if any problem.  Precautions discussed.   Electronically Signed Darreld Mclean, MD 1/14/20202:58 PM

## 2018-08-06 DIAGNOSIS — Z0131 Encounter for examination of blood pressure with abnormal findings: Secondary | ICD-10-CM | POA: Diagnosis not present

## 2018-08-17 ENCOUNTER — Ambulatory Visit: Payer: Self-pay | Admitting: Orthopaedic Surgery

## 2018-08-18 ENCOUNTER — Encounter: Payer: Self-pay | Admitting: Orthopaedic Surgery

## 2018-08-18 ENCOUNTER — Ambulatory Visit: Payer: Medicare Other | Admitting: Orthopaedic Surgery

## 2018-08-18 VITALS — BP 143/83 | HR 91 | Ht 65.0 in | Wt 207.0 lb

## 2018-08-18 DIAGNOSIS — M79662 Pain in left lower leg: Secondary | ICD-10-CM

## 2018-08-18 DIAGNOSIS — M25572 Pain in left ankle and joints of left foot: Secondary | ICD-10-CM

## 2018-08-18 DIAGNOSIS — G8929 Other chronic pain: Secondary | ICD-10-CM | POA: Diagnosis not present

## 2018-08-18 NOTE — Progress Notes (Signed)
Patient [name], male DOB:03-Aug-1945, 73 y.o. SEG:[id number]  Chief Complaint  Patient presents with  . Ankle Pain    HPI  [name] is a 73 y.o. male who has ankle pain on the left.  He is better.  The cold weather makes it a little worse.  He has not been as active in his walking.  He has less swelling.  His wife is better also.    Body mass index is 34.45 kg/m.  ROS  Review of Systems  Constitutional: Positive for activity change.  Musculoskeletal: Positive for arthralgias, gait problem and joint swelling.  All other systems reviewed and are negative.   All other systems reviewed and are negative.  The following is a summary of the past history medically, past history surgically, known current medicines, social history and family history.  This information is gathered electronically by the computer from prior information and documentation.  I review this each visit and have found including this information at this point in the chart is beneficial and informative.    Past Medical History:  Diagnosis Date  . Acid reflux   . Hypertension     Past Surgical History:  Procedure Laterality Date  . ORTHOPEDIC SURGERY      Family History  Problem Relation Age of Onset  . CVA Father     Social History Social History   Tobacco Use  . Smoking status: Former Research scientist (life sciences)  . Smokeless tobacco: Never Used  Substance Use Topics  . Alcohol use: Never    Frequency: Never  . Drug use: Not on file    No Known Allergies  No current outpatient medications on file.   No current facility-administered medications for this visit.      Physical Exam  Blood pressure (!) 143/83, pulse 91, height 5\' 5"  (1.651 m), weight 207 lb (93.9 kg).  Constitutional: overall normal hygiene, normal nutrition, well developed, normal grooming, normal body habitus. Assistive device:none  Musculoskeletal: gait and station Limp left, muscle tone and strength are normal, no tremors or  atrophy is present.  .  Neurological: coordination overall normal.  Deep tendon reflex/nerve stretch intact.  Sensation normal.  Cranial nerves II-XII intact.   Skin:   Normal overall no scars, lesions, ulcers or rashes. No psoriasis.  Psychiatric: Alert and oriented x 3.  Recent memory intact, remote memory unclear.  Normal mood and affect. Well groomed.  Good eye contact.  Cardiovascular: overall no swelling, no varicosities, no edema bilaterally, normal temperatures of the legs and arms, no clubbing, cyanosis and good capillary refill.  Lymphatic: palpation is normal.  Left ankle has no swelling today. It is slightly tender.  NV intact. He has slight limp left.    All other systems reviewed and are negative   The patient has been educated about the nature of the problem(s) and counseled on treatment options.  The patient appeared to understand what I have discussed and is in agreement with it.  Encounter Diagnoses  Name Primary?  . Chronic pain of left ankle Yes  . Pain in left lower leg     PLAN Call if any problems.  Precautions discussed.  Continue current medications.   Return to clinic prn   Electronically Signed Sanjuana Kava, MD 1/29/20202:24 PM

## 2019-02-21 DIAGNOSIS — H34832 Tributary (branch) retinal vein occlusion, left eye, with macular edema: Secondary | ICD-10-CM | POA: Diagnosis not present

## 2019-02-21 DIAGNOSIS — H2513 Age-related nuclear cataract, bilateral: Secondary | ICD-10-CM | POA: Diagnosis not present

## 2019-03-25 DIAGNOSIS — H34832 Tributary (branch) retinal vein occlusion, left eye, with macular edema: Secondary | ICD-10-CM | POA: Diagnosis not present

## 2019-04-14 ENCOUNTER — Other Ambulatory Visit (HOSPITAL_COMMUNITY): Payer: Self-pay | Admitting: Family Medicine

## 2019-04-14 ENCOUNTER — Ambulatory Visit (HOSPITAL_COMMUNITY)
Admission: RE | Admit: 2019-04-14 | Discharge: 2019-04-14 | Disposition: A | Discharge status: Home / Self Care | Payer: Medicare Other | Source: Ambulatory Visit | Attending: Family Medicine | Admitting: Family Medicine

## 2019-04-14 ENCOUNTER — Other Ambulatory Visit: Payer: Self-pay

## 2019-04-14 DIAGNOSIS — R0781 Pleurodynia: Secondary | ICD-10-CM

## 2019-04-14 DIAGNOSIS — M25512 Pain in left shoulder: Secondary | ICD-10-CM | POA: Diagnosis not present

## 2019-04-14 DIAGNOSIS — R079 Chest pain, unspecified: Secondary | ICD-10-CM | POA: Diagnosis not present

## 2019-04-14 DIAGNOSIS — M19012 Primary osteoarthritis, left shoulder: Secondary | ICD-10-CM | POA: Diagnosis not present

## 2019-04-16 DIAGNOSIS — R030 Elevated blood-pressure reading, without diagnosis of hypertension: Secondary | ICD-10-CM | POA: Diagnosis not present

## 2019-04-16 DIAGNOSIS — M25512 Pain in left shoulder: Secondary | ICD-10-CM | POA: Diagnosis not present

## 2019-04-21 DIAGNOSIS — I10 Essential (primary) hypertension: Secondary | ICD-10-CM | POA: Diagnosis not present

## 2019-04-21 DIAGNOSIS — M25512 Pain in left shoulder: Secondary | ICD-10-CM | POA: Diagnosis not present

## 2019-04-22 DIAGNOSIS — H34832 Tributary (branch) retinal vein occlusion, left eye, with macular edema: Secondary | ICD-10-CM | POA: Diagnosis not present

## 2019-04-28 DIAGNOSIS — R0789 Other chest pain: Secondary | ICD-10-CM | POA: Diagnosis not present

## 2019-04-28 DIAGNOSIS — I10 Essential (primary) hypertension: Secondary | ICD-10-CM | POA: Diagnosis not present

## 2019-04-28 DIAGNOSIS — M25512 Pain in left shoulder: Secondary | ICD-10-CM | POA: Diagnosis not present

## 2019-05-20 DIAGNOSIS — H34832 Tributary (branch) retinal vein occlusion, left eye, with macular edema: Secondary | ICD-10-CM | POA: Diagnosis not present

## 2019-05-31 DIAGNOSIS — R7301 Impaired fasting glucose: Secondary | ICD-10-CM | POA: Diagnosis not present

## 2019-05-31 DIAGNOSIS — Z9181 History of falling: Secondary | ICD-10-CM | POA: Diagnosis not present

## 2019-06-02 ENCOUNTER — Ambulatory Visit: Payer: Medicare Other | Admitting: Cardiology

## 2019-06-02 ENCOUNTER — Encounter: Payer: Self-pay | Admitting: Cardiology

## 2019-06-02 ENCOUNTER — Other Ambulatory Visit: Payer: Self-pay

## 2019-06-02 VITALS — BP 168/85 | HR 72 | Temp 97.3°F | Ht 65.0 in | Wt 203.0 lb

## 2019-06-02 DIAGNOSIS — I10 Essential (primary) hypertension: Secondary | ICD-10-CM | POA: Diagnosis not present

## 2019-06-02 DIAGNOSIS — I259 Chronic ischemic heart disease, unspecified: Secondary | ICD-10-CM

## 2019-06-02 DIAGNOSIS — F17201 Nicotine dependence, unspecified, in remission: Secondary | ICD-10-CM

## 2019-06-02 DIAGNOSIS — R079 Chest pain, unspecified: Secondary | ICD-10-CM

## 2019-06-02 DIAGNOSIS — I209 Angina pectoris, unspecified: Secondary | ICD-10-CM | POA: Diagnosis not present

## 2019-06-02 DIAGNOSIS — E782 Mixed hyperlipidemia: Secondary | ICD-10-CM

## 2019-06-02 MED ORDER — METOPROLOL TARTRATE 100 MG PO TABS: ORAL_TABLET | ORAL | 0 refills | Status: DC

## 2019-06-02 NOTE — Progress Notes (Addendum)
Cardiology Office Note  Date: 06/02/2019   ID: [name], DOB [birth year], MRN [id number]  PCP:  Benita Stabile, MD  Consulting Cardiologist: Jonelle Sidle, MD Electrophysiologist:  None   Chief Complaint  Patient presents with  . Chest Pain    History of Present Illness: [name] is a 73 y.o. male referred for cardiology consultation by Dr. Margo Aye for the evaluation of chest pain.  He states that over the last several months he has noticed intermittently a sensation of chest tightness with exertion.  Typically happens when he is doing outside chores, mowing the lawn or pushing a wheelbarrow up an incline.  He is able to continue on with his work and symptoms resolved with rest.  He has no known history of cardiovascular disease but does have history of hypertension and hyperlipidemia.  He is on lisinopril and Crestor, follows with the [place] [facility] in [place] along with Dr. Margo Aye.  He has a remote history of tobacco abuse. He is a [place] veteran.  He does not report any obvious family history of premature CAD.  His mother had an "enlarged heart."  Father had a hemorrhagic stroke.   10-year CVD risk of 33.8% by risk estimator.  I personally reviewed his ECG today which shows sinus rhythm with decreased R wave progression.  Recent lab work is outlined below.  Past Medical History:  Diagnosis Date  . Depression   . Essential hypertension   . GERD (gastroesophageal reflux disease)   . Mixed hyperlipidemia     Past Surgical History:  Procedure Laterality Date  . ORTHOPEDIC SURGERY      Current Outpatient Medications  Medication Sig Dispense Refill  . lisinopril (ZESTRIL) 10 MG tablet Take 10 mg by mouth daily.    . traMADol (ULTRAM) 50 MG tablet Take 50 mg by mouth every 8 (eight) hours as needed.    . metoprolol tartrate (LOPRESSOR) 100 MG tablet Take 1 tablet (100 mg) 2 hours before cardiac ct 1 tablet 0   No current facility-administered medications for this  visit.    Allergies:  Patient has no known allergies.   Social History: The patient  reports that he has quit smoking. His smoking use included cigarettes. He has never used smokeless tobacco. He reports that he does not drink alcohol.   Family History: The patient's family history includes CVA in his father.   ROS:  Please see the history of present illness. Otherwise, complete review of systems is positive for chronic arthritic knee pain.  All other systems are reviewed and negative.   Physical Exam: VS:  BP (!) 168/85   Pulse 72   Temp (!) 97.3 F (36.3 C) (Temporal)   Ht 5\' 5"  (1.651 m)   Wt 203 lb (92.1 kg)   SpO2 98%   BMI 33.78 kg/m , BMI Body mass index is 33.78 kg/m.  Wt Readings from Last 3 Encounters:  06/02/19 203 lb (92.1 kg)  08/18/18 207 lb (93.9 kg)  08/03/18 206 lb (93.4 kg)    General: Elderly male, appears comfortable at rest. HEENT: Conjunctiva and lids normal, wearing a mask. Neck: Supple, no elevated JVP or carotid bruits, no thyromegaly. Lungs: Clear to auscultation, nonlabored breathing at rest. Cardiac: Regular rate and rhythm, no S3 or significant systolic murmur, no pericardial rub. Abdomen: Soft, nontender, bowel sounds present. Extremities: Mild left ankle edema and foot deformity (previous injury), distal pulses 2+. Skin: Warm and dry. Musculoskeletal: No kyphosis. Neuropsychiatric: Alert and  oriented x3, affect grossly appropriate.  ECG:  No prior tracings for review today.  Recent Labwork:  November 2020: Hemoglobin 13.1, platelets 241, BUN 13, creatinine 1.06, potassium 4.6, AST 22, ALT 16, cholesterol 150, triglycerides 88, HDL 52, LDL 81, hemoglobin A1c 5.6%  Other Studies Reviewed Today:  Chest x-ray 04/14/2019: FINDINGS: Cardiomediastinal silhouette unchanged in size and contour. No evidence of central vascular congestion. No interlobular septal thickening. No pneumothorax or pleural effusion.  No confluent airspace disease.   No displaced fracture  IMPRESSION: Negative for acute cardiopulmonary disease.  Assessment and Plan:  1.  Exertional chest pain consistent with angina pectoris in a 73 year old male with hypertension and hyperlipidemia both medically managed and a remote history of tobacco abuse.  No definite family history of premature CAD.  10-year CVD risk is high.  He has not undergone any prior cardiac ischemic evaluations and ECG is nonspecific.  Plan to proceed with cardiac CTA.  2.  Essential hypertension, on lisinopril, dose recently increased to 10 mg daily by PCP.  Keep follow-up for further titration.  Might benefit from addition of low-dose diuretic.  3.  Mixed hyperlipidemia by history, apparently on Crestor, he does not know the dose.  He will call back with medication.  This was provided by the [facility].  Recent LDL 81 and HDL 52.  4.  No history of type 2 diabetes mellitus, hemoglobin A1c 5.6%.  Medication Adjustments/Labs and Tests Ordered: Current medicines are reviewed at length with the patient today.  Concerns regarding medicines are outlined above.   Tests Ordered: Orders Placed This Encounter  Procedures  . CT CORONARY MORPH W/CTA COR W/SCORE W/CA W/CM &/OR WO/CM  . CT CORONARY FRACTIONAL FLOW RESERVE DATA PREP  . CT CORONARY FRACTIONAL FLOW RESERVE FLUID ANALYSIS  . Basic Metabolic Panel (BMET)  . EKG 12-Lead    Medication Changes: Meds ordered this encounter  Medications  . metoprolol tartrate (LOPRESSOR) 100 MG tablet    Sig: Take 1 tablet (100 mg) 2 hours before cardiac ct    Dispense:  1 tablet    Refill:  0    Disposition:  Follow up test results and determine next step.  Signed, Jonelle Sidle, MD, [facility] 06/02/2019 9:41 AM    [facility] at [facility] [address], [place], [place] [address] Phone: [phone]; Fax: [phone]

## 2019-06-02 NOTE — Patient Instructions (Addendum)
.  Medication Instructions:  Call me at [phone] and give me the strength of your Crestor you are taking, [phone]   Take Lopressor 100 mg 2 hours before CT   *If you need a refill on your cardiac medications before your next appointment, please call your pharmacy*  Lab Work: BMET 1 week BEFORE Cardiac CT  If you have labs (blood work) drawn today and your tests are completely normal, you will receive your results only by: Marland Kitchen MyChart Message (if you have MyChart) OR . A paper copy in the mail If you have any lab test that is abnormal or we need to change your treatment, we will call you to review the results.  Testing/Procedures: Please schedule your cardiac CT, [facility] radiology will call you to schedule  Follow-Up: At [facility], you and your health needs are our priority.  As part of our continuing mission to provide you with exceptional heart care, we have created designated Provider Care Teams.  These Care Teams include your primary Cardiologist (physician) and Advanced Practice Providers (APPs -  Physician Assistants and Nurse Practitioners) who all work together to provide you with the care you need, when you need it.  Your next appointment:   We will call you with results  The format for your next appointment:

## 2019-06-03 ENCOUNTER — Telehealth: Payer: Self-pay

## 2019-06-03 MED ORDER — PRAVASTATIN SODIUM 40 MG PO TABS: 40.0000 mg | ORAL_TABLET | Freq: Every evening | ORAL | 3 refills | Status: DC

## 2019-06-03 NOTE — Telephone Encounter (Signed)
New Medication    Patient is taking 1 tablet daily 40 mg pravastatin for his cholesterol , he was told to call back and give the name and dosage.

## 2019-06-03 NOTE — Telephone Encounter (Signed)
Added to pt's medication list. Will forward to Dr. Domenic Polite as an Juluis Rainier.

## 2019-06-09 DIAGNOSIS — I10 Essential (primary) hypertension: Secondary | ICD-10-CM | POA: Diagnosis not present

## 2019-06-09 DIAGNOSIS — E785 Hyperlipidemia, unspecified: Secondary | ICD-10-CM | POA: Diagnosis not present

## 2019-06-09 DIAGNOSIS — Z712 Person consulting for explanation of examination or test findings: Secondary | ICD-10-CM | POA: Diagnosis not present

## 2019-06-09 DIAGNOSIS — R7301 Impaired fasting glucose: Secondary | ICD-10-CM | POA: Diagnosis not present

## 2019-07-12 DIAGNOSIS — H34832 Tributary (branch) retinal vein occlusion, left eye, with macular edema: Secondary | ICD-10-CM | POA: Diagnosis not present

## 2019-08-08 ENCOUNTER — Other Ambulatory Visit (HOSPITAL_COMMUNITY)
Admission: RE | Admit: 2019-08-08 | Discharge: 2019-08-08 | Disposition: A | Discharge status: Home / Self Care | Payer: Medicare Other | Source: Ambulatory Visit | Attending: Cardiology | Admitting: Cardiology

## 2019-08-08 ENCOUNTER — Other Ambulatory Visit: Payer: Self-pay

## 2019-08-08 DIAGNOSIS — R079 Chest pain, unspecified: Secondary | ICD-10-CM | POA: Insufficient documentation

## 2019-08-08 LAB — BASIC METABOLIC PANEL
Anion gap: 7 (ref 5–15)
BUN: 15 mg/dL (ref 8–23)
CO2: 27 mmol/L (ref 22–32)
Calcium: 9 mg/dL (ref 8.9–10.3)
Chloride: 106 mmol/L (ref 98–111)
Creatinine, Ser: 0.93 mg/dL (ref 0.61–1.24)
GFR calc Af Amer: >60 mL/min (ref >60)
GFR calc non Af Amer: >60 mL/min (ref >60)
Glucose, Bld: 114 mg/dL — ABNORMAL HIGH (ref 70–99)
Potassium: 4.1 mmol/L (ref 3.5–5.1)
Sodium: 140 mmol/L (ref 135–145)

## 2019-08-12 ENCOUNTER — Telehealth (HOSPITAL_COMMUNITY): Payer: Self-pay | Admitting: Emergency Medicine

## 2019-08-12 NOTE — Telephone Encounter (Signed)
Left message on voicemail with name and callback number Milen Lengacher RN Navigator Cardiac Imaging  Heart and Vascular Services [phone] Office [phone] Cell  

## 2019-08-15 ENCOUNTER — Ambulatory Visit (HOSPITAL_COMMUNITY)
Admission: RE | Admit: 2019-08-15 | Discharge: 2019-08-15 | Disposition: A | Discharge status: Home / Self Care | Payer: Medicare Other | Source: Ambulatory Visit | Attending: Cardiology | Admitting: Cardiology

## 2019-08-15 ENCOUNTER — Other Ambulatory Visit: Payer: Self-pay

## 2019-08-15 DIAGNOSIS — I259 Chronic ischemic heart disease, unspecified: Secondary | ICD-10-CM | POA: Insufficient documentation

## 2019-08-15 DIAGNOSIS — I2510 Atherosclerotic heart disease of native coronary artery without angina pectoris: Secondary | ICD-10-CM | POA: Diagnosis not present

## 2019-08-15 MED ORDER — METOPROLOL TARTRATE 5 MG/5ML IV SOLN: 5.0000 mg | INTRAVENOUS | Status: DC | PRN

## 2019-08-15 MED ORDER — NITROGLYCERIN 0.4 MG SL SUBL
SUBLINGUAL_TABLET | SUBLINGUAL | Status: AC
  Filled 2019-08-15: qty 2

## 2019-08-15 MED ORDER — IOHEXOL 350 MG/ML SOLN
80.0000 mL | Freq: Once | INTRAVENOUS | Status: AC | PRN
  Administered 2019-08-15: 90 mL via INTRAVENOUS

## 2019-08-15 MED ORDER — NITROGLYCERIN 0.4 MG SL SUBL
0.8000 mg | SUBLINGUAL_TABLET | Freq: Once | SUBLINGUAL | Status: AC
  Administered 2019-08-15: 0.8 mg via SUBLINGUAL

## 2019-08-16 ENCOUNTER — Telehealth: Payer: Self-pay

## 2019-08-16 DIAGNOSIS — I2510 Atherosclerotic heart disease of native coronary artery without angina pectoris: Secondary | ICD-10-CM | POA: Diagnosis not present

## 2019-08-16 MED ORDER — ASPIRIN EC 81 MG PO TBEC: 81.0000 mg | DELAYED_RELEASE_TABLET | Freq: Every day | ORAL | 3 refills | Status: AC

## 2019-08-16 MED ORDER — METOPROLOL SUCCINATE ER 25 MG PO TB24: 25.0000 mg | ORAL_TABLET | Freq: Every day | ORAL | 3 refills | Status: DC

## 2019-08-16 NOTE — Telephone Encounter (Signed)
-----   Message from Satira Sark, MD sent at 08/16/2019  8:33 AM EST ----- Results reviewed.  CAD present with calcium score 433.  Most significant stenoses involve the mid LAD, second diagonal, and obtuse marginal system.  Subsequently reported FFR evaluation suggest flow limitation in the obtuse marginal system only, nonobstructive otherwise.  Would plan to maximize medical therapy prior to considering further invasive cardiac testing such as a cardiac catheterization, unless his symptoms escalate.  Make sure that he has a follow-up visit in the next few weeks.  He should be on aspirin, Crestor as before, lisinopril.  Please start Toprol-XL 25 mg daily.

## 2019-08-16 NOTE — Telephone Encounter (Signed)
1/26 [id number]  Follow up apt made with Dr.McDowell on 09/01/2019 at 9:40 am

## 2019-09-01 ENCOUNTER — Ambulatory Visit: Payer: Medicare Other | Admitting: Cardiology

## 2019-09-01 ENCOUNTER — Encounter: Payer: Self-pay | Admitting: Cardiology

## 2019-09-01 VITALS — BP 180/77 | HR 57 | Temp 98.9°F | Ht 65.0 in | Wt 203.0 lb

## 2019-09-01 DIAGNOSIS — E782 Mixed hyperlipidemia: Secondary | ICD-10-CM | POA: Diagnosis not present

## 2019-09-01 DIAGNOSIS — I25119 Atherosclerotic heart disease of native coronary artery with unspecified angina pectoris: Secondary | ICD-10-CM

## 2019-09-01 DIAGNOSIS — I10 Essential (primary) hypertension: Secondary | ICD-10-CM

## 2019-09-01 MED ORDER — ROSUVASTATIN CALCIUM 10 MG PO TABS: 10.0000 mg | ORAL_TABLET | Freq: Every day | ORAL | 3 refills | Status: DC

## 2019-09-01 MED ORDER — AMLODIPINE BESYLATE 5 MG PO TABS: 5.0000 mg | ORAL_TABLET | Freq: Every day | ORAL | 3 refills | Status: DC

## 2019-09-01 NOTE — Progress Notes (Signed)
Cardiology Office Note  Date: 09/01/2019   ID: [name], DOB 1946/06/23, MRN [id number]  PCP:  Benita Stabile, MD  Cardiologist:  Nona Dell, MD Electrophysiologist:  None   Chief Complaint  Patient presents with  . Follow-up cardiac testing    History of Present Illness: [name] is a 74 y.o. male seen in consultation back in November 2020.  He presents for follow-up and review of interval testing.  We discussed the results of his cardiac CTA with subsequent FFR imaging.  He had a calcium score of 433 with coronary artery disease involving the mid LAD, second diagonal, and obtuse marginal.  FFR imaging indicated that the obtuse marginal alone was flow-limiting.  He does not describe any progressive angina at this time.  We discussed his medications and anticipated adjustments for more aggressive management of his atherosclerosis.  Past Medical History:  Diagnosis Date  . CAD (coronary artery disease)   . Depression   . Essential hypertension   . GERD (gastroesophageal reflux disease)   . Mixed hyperlipidemia     Past Surgical History:  Procedure Laterality Date  . ORTHOPEDIC SURGERY      Current Outpatient Medications  Medication Sig Dispense Refill  . aspirin EC 81 MG tablet Take 1 tablet (81 mg total) by mouth daily. 90 tablet 3  . lisinopril (ZESTRIL) 10 MG tablet Take 10 mg by mouth daily.    . metoprolol succinate (TOPROL XL) 25 MG 24 hr tablet Take 1 tablet (25 mg total) by mouth daily. 90 tablet 3  . traMADol (ULTRAM) 50 MG tablet Take 50 mg by mouth every 8 (eight) hours as needed.    Marland Kitchen amLODipine (NORVASC) 5 MG tablet Take 1 tablet (5 mg total) by mouth daily. 180 tablet 3  . rosuvastatin (CRESTOR) 10 MG tablet Take 1 tablet (10 mg total) by mouth daily. 90 tablet 3   No current facility-administered medications for this visit.   Allergies:  Patient has no known allergies.   ROS:  Hearing loss.  Physical Exam: VS:  BP (!) 180/77   Pulse  (!) 57   Temp 98.9 F (37.2 C)   Ht 5\' 5"  (1.651 m)   Wt 203 lb (92.1 kg)   SpO2 99%   BMI 33.78 kg/m , BMI Body mass index is 33.78 kg/m.  Wt Readings from Last 3 Encounters:  09/01/19 203 lb (92.1 kg)  06/02/19 203 lb (92.1 kg)  08/18/18 207 lb (93.9 kg)    General: Elderly male, appears comfortable at rest. HEENT: Conjunctiva and lids normal, wearing a mask. Neck: Supple, no elevated JVP or carotid bruits, no thyromegaly. Lungs: Clear to auscultation, nonlabored breathing at rest. Cardiac: Regular rate and rhythm, no S3 or significant systolic murmur, no pericardial rub.  ECG:  An ECG dated 06/02/2019 was personally reviewed today and demonstrated:  Sinus rhythm with decreased R wave progression.  Recent Labwork: 08/08/2019: BUN 15; Creatinine, Ser 0.93; Potassium 4.1; Sodium 140  November 2020: Hemoglobin 13.1, platelets 241, BUN 13, creatinine 1.06, potassium 4.6, AST 22, ALT 16, cholesterol 150, triglycerides 88, HDL 52, LDL 81, hemoglobin A1c 5.6%  Other Studies Reviewed Today:  Cardiac CTA 08/15/2019: FINDINGS: A 120 kV prospective scan was triggered in the descending thoracic aorta at 111 HU's. Axial non-contrast 3 mm slices were carried out through the heart. The data set was analyzed on a dedicated work station and scored using the Agatson method. Gantry rotation speed was 250 msecs and collimation  was .6 mm. No beta blockade and 0.8 mg of sl NTG was given. The 3D data set was reconstructed in 5% intervals of the 67-82 % of the R-R cycle. Diastolic phases were analyzed on a dedicated work station using MPR, MIP and VRT modes. The patient received 80 cc of contrast.  Aorta:  Normal size.  Aortic atherosclerosis noted.  No dissection.  Aortic Valve:  Trileaflet.  No calcifications.  Coronary Arteries:  Normal coronary origin.  Right dominance.  RCA is a large dominant artery that gives rise to RV branch (calcification noted proximally), PDA and PLVB. There  is minimal (0-24%) calcified proximal stenosis, mild (25-49%) calcified mid stenosis and minimal (0-24%) calcified distal stenosis.  Left main is a large artery that gives rise to LAD and LCX arteries. There is minimal (0-24%) calcified plaque in the distal LM.  LAD is a large vessel that gives rise to small D1 and larger D2. There is severe (70-99%) calcified stenosis in the mid LAD at D1 and minimal (0-24%) distal; there is minimal (0-24%) proximal stenosis followed by severe (70-99%) calcified stenosis in D2.  LCX is a non-dominant artery that gives rise to one OM1 branch. There is mild calcified plaque in the proximal vessel and severe (70-99%) stenosis in OM.  Other findings:  Normal pulmonary vein drainage into the left atrium.  Normal let atrial appendage without a thrombus.  Normal size of the pulmonary artery.  IMPRESSION: 1. Coronary calcium score of 433. This was 92 percentile for age and sex matched control.  2. Normal coronary origin with right dominance.  3. Severe CAD with severe (70-99%) stenosis in the mid LAD, D2 and OM; CADRADS-4.  4. Study will be sent for FFR.  Cardiac CT FFR 08/15/2019: FINDINGS: FFRct analysis was performed on the original cardiac CT angiogram dataset. Diagrammatic representation of the FFRct analysis is provided in a separate PDF document in PACS. This dictation was created using the PDF document and an interactive 3D model of the results. 3D model is not available in the EMR/PACS. Normal FFR range is >0.80.  1. Left Main: findings 0.98,  0.96  2. LAD: findings 0.95, 0.93 0.88; D2 0.89, 0.82  3. LCX: findings 0.96, 0.93 0.89; OM <0.5, <0.5 4. RCA: findings 0.85, 0.84 0.80  IMPRESSION: Findings suggestive of significant obstructive disease in OM and nonobstructive disease in mid LAD and D2.  Assessment and Plan:  1.  Coronary artery disease based on recent cardiac CTA.  Only flow-limiting stenosis involved  in OM branch by FFR imaging.  Plan will be medical therapy at this point unless symptoms escalate.  He will continue aspirin, Toprol-XL, and lisinopril.  We are adding Norvasc as antianginal and antihypertensive, also switching from Pravachol to Crestor.  2.  Mixed hyperlipidemia, currently on Pravachol.  His last LDL was 81. We will switch to Crestor 10 mg daily for more aggressive control.  3.  Essential hypertension, not optimally controlled.  Continue Toprol-XL and lisinopril.  Add Norvasc 5 mg daily.  Medication Adjustments/Labs and Tests Ordered: Current medicines are reviewed at length with the patient today.  Concerns regarding medicines are outlined above.   Tests Ordered: Orders Placed This Encounter  Procedures  . Lipid Profile  . Hepatic function panel    Medication Changes: Meds ordered this encounter  Medications  . rosuvastatin (CRESTOR) 10 MG tablet    Sig: Take 1 tablet (10 mg total) by mouth daily.    Dispense:  90 tablet    Refill:  3  . amLODipine (NORVASC) 5 MG tablet    Sig: Take 1 tablet (5 mg total) by mouth daily.    Dispense:  180 tablet    Refill:  3    Disposition:  Follow up 6 months in the [place] office.  Signed, Jonelle Sidle, MD, [facility] 09/01/2019 10:23 AM     [facility] at [facility] [address] [place], [place] [address] Phone: [phone]; Fax: [phone]

## 2019-09-01 NOTE — Patient Instructions (Addendum)
Medication Instructions:  FINISH your Pravachol, then, START Crestor 10 mg daily at dinner.    START Norvasc 5 mg daily     *If you need a refill on your cardiac medications before your next appointment, please call your pharmacy*  Lab Work:  FASTING Lipids and LFT's JUST BEFORE visit in 6 months  If you have labs (blood work) drawn today and your tests are completely normal, you will receive your results only by: Marland Kitchen MyChart Message (if you have MyChart) OR . A paper copy in the mail If you have any lab test that is abnormal or we need to change your treatment, we will call you to review the results.  Testing/Procedures: none  Follow-Up: At [facility], you and your health needs are our priority.  As part of our continuing mission to provide you with exceptional heart care, we have created designated Provider Care Teams.  These Care Teams include your primary Cardiologist (physician) and Advanced Practice Providers (APPs -  Physician Assistants and Nurse Practitioners) who all work together to provide you with the care you need, when you need it.  Your next appointment:   6 month(s)  The format for your next appointment:   In Person  Provider:   Rozann Lesches, MD  Other Instructions None      Thank you for choosing [facility] !

## 2019-09-02 ENCOUNTER — Other Ambulatory Visit: Payer: Self-pay

## 2019-09-02 NOTE — Patient Outreach (Signed)
[facility]) Care Management  09/02/2019  JAVARUS KITCHENS 06/29/1946 [id number]   Medication Adherence call to Mr. Adewale Gentz HIPPA Compliant Voice message left with a call back number. Mr. Lana is showing past due on Lisinopril 20 mg under [place].   [place] Management Direct Dial [phone]  Fax [phone] [email or web address]

## 2019-09-15 ENCOUNTER — Ambulatory Visit: Payer: Medicare Other | Attending: Internal Medicine

## 2019-09-15 DIAGNOSIS — Z23 Encounter for immunization: Secondary | ICD-10-CM

## 2019-09-15 NOTE — Progress Notes (Signed)
Covid-19 Vaccination Clinic  Name:  [name]    MRN: [id number] DOB: 1945-12-22  09/15/2019  Mr. Fix was observed post Covid-19 immunization for 15 minutes without incidence. He was provided with Vaccine Information Sheet and instruction to access the V-Safe system.   [name] was instructed to call 911 with any severe reactions post vaccine: Marland Kitchen Difficulty breathing  . Swelling of your face and throat  . A fast heartbeat  . A bad rash all over your body  . Dizziness and weakness    Immunizations Administered    Name Date Dose VIS Date Route   Pfizer COVID-19 Vaccine 09/15/2019  3:42 PM 0.3 mL 07/01/2019 Intramuscular   Manufacturer: [employer], [employer]   Lot: [id number]   NDC: [address]-4

## 2019-09-27 ENCOUNTER — Other Ambulatory Visit: Payer: Self-pay

## 2019-09-27 NOTE — Patient Outreach (Signed)
[facility]) Care Management  09/27/2019  [name] [birth year] [id number]   Medication Adherence call to [name] HIPPA Compliant Voice message left with a call back number. [name] is showing past due on Lisinopril 20 mg under [place].   [place] Management Direct Dial [phone]  Fax [phone] [email or web address]

## 2019-10-11 ENCOUNTER — Ambulatory Visit: Payer: Medicare Other | Attending: Internal Medicine

## 2019-10-11 DIAGNOSIS — Z23 Encounter for immunization: Secondary | ICD-10-CM

## 2019-10-11 NOTE — Progress Notes (Signed)
Covid-19 Vaccination Clinic  Name:  [name]    MRN: [id number] DOB: Dec 26, 1945  10/11/2019  Mr. Breighner was observed post Covid-19 immunization for 15 minutes without incident. He was provided with Vaccine Information Sheet and instruction to access the V-Safe system.   Mr. Edkin was instructed to call 911 with any severe reactions post vaccine: Marland Kitchen Difficulty breathing  . Swelling of face and throat  . A fast heartbeat  . A bad rash all over body  . Dizziness and weakness   Immunizations Administered    Name Date Dose VIS Date Route   Pfizer COVID-19 Vaccine 10/11/2019 12:57 PM 0.3 mL 07/01/2019 Intramuscular   Manufacturer: [employer], [employer]   Lot: [id number]   NDC: [address]-5

## 2019-10-17 DIAGNOSIS — H348322 Tributary (branch) retinal vein occlusion, left eye, stable: Secondary | ICD-10-CM | POA: Diagnosis not present

## 2019-10-17 DIAGNOSIS — H2513 Age-related nuclear cataract, bilateral: Secondary | ICD-10-CM | POA: Diagnosis not present

## 2019-12-12 DIAGNOSIS — H6123 Impacted cerumen, bilateral: Secondary | ICD-10-CM | POA: Diagnosis not present

## 2019-12-12 DIAGNOSIS — H11002 Unspecified pterygium of left eye: Secondary | ICD-10-CM | POA: Diagnosis not present

## 2019-12-12 DIAGNOSIS — R7301 Impaired fasting glucose: Secondary | ICD-10-CM | POA: Diagnosis not present

## 2019-12-12 DIAGNOSIS — E039 Hypothyroidism, unspecified: Secondary | ICD-10-CM | POA: Diagnosis not present

## 2019-12-12 DIAGNOSIS — M25512 Pain in left shoulder: Secondary | ICD-10-CM | POA: Diagnosis not present

## 2019-12-15 DIAGNOSIS — E785 Hyperlipidemia, unspecified: Secondary | ICD-10-CM | POA: Diagnosis not present

## 2019-12-15 DIAGNOSIS — I10 Essential (primary) hypertension: Secondary | ICD-10-CM | POA: Diagnosis not present

## 2019-12-15 DIAGNOSIS — R7301 Impaired fasting glucose: Secondary | ICD-10-CM | POA: Diagnosis not present

## 2019-12-15 DIAGNOSIS — H6123 Impacted cerumen, bilateral: Secondary | ICD-10-CM | POA: Diagnosis not present

## 2019-12-15 DIAGNOSIS — Z0001 Encounter for general adult medical examination with abnormal findings: Secondary | ICD-10-CM | POA: Diagnosis not present

## 2019-12-20 ENCOUNTER — Encounter: Payer: Self-pay | Admitting: Neurology

## 2020-02-27 ENCOUNTER — Other Ambulatory Visit: Payer: Self-pay

## 2020-02-27 ENCOUNTER — Other Ambulatory Visit (HOSPITAL_COMMUNITY)
Admission: RE | Admit: 2020-02-27 | Discharge: 2020-02-27 | Disposition: A | Discharge status: Home / Self Care | Payer: Medicare Other | Source: Ambulatory Visit | Attending: Cardiology | Admitting: Cardiology

## 2020-02-27 DIAGNOSIS — E782 Mixed hyperlipidemia: Secondary | ICD-10-CM | POA: Insufficient documentation

## 2020-02-27 LAB — LIPID PANEL
Cholesterol: 116 mg/dL (ref 0–200)
HDL: 55 mg/dL (ref >40)
LDL Cholesterol: 54 mg/dL (ref 0–99)
Total CHOL/HDL Ratio: 2.1 RATIO
Triglycerides: 34 mg/dL (ref <150)
VLDL: 7 mg/dL (ref 0–40)

## 2020-02-27 LAB — HEPATIC FUNCTION PANEL
ALT: 25 U/L (ref 0–44)
AST: 25 U/L (ref 15–41)
Albumin: 4.5 g/dL (ref 3.5–5.0)
Alkaline Phosphatase: 75 U/L (ref 38–126)
Bilirubin, Direct: 0.1 mg/dL (ref 0.0–0.2)
Indirect Bilirubin: 0.5 mg/dL (ref 0.3–0.9)
Total Bilirubin: 0.6 mg/dL (ref 0.3–1.2)
Total Protein: 7.3 g/dL (ref 6.5–8.1)

## 2020-03-06 NOTE — Progress Notes (Signed)
Cardiology Office Note  Date: 03/07/2020   ID: [name], DOB 1946/01/02, MRN [id number]  PCP:  Benita Stabile, MD  Cardiologist:  Nona Dell, MD Electrophysiologist:  None   Chief Complaint  Patient presents with  . Cardiac follow-up    History of Present Illness: [name] is a 74 y.o. male last seen in February.  He presents for a routine office visit.  He does not report any progressive exertional angina symptoms on medical therapy.  Recent lab work is outlined below.  LDL 54.  We went over his medications, he does not report any intolerances.  Plan is to continue medical therapy for treatment of OM branch disease documented by cardiac CTA with FFR imaging, otherwise nonobstructive.  Past Medical History:  Diagnosis Date  . CAD (coronary artery disease)   . Depression   . Essential hypertension   . GERD (gastroesophageal reflux disease)   . Mixed hyperlipidemia     Past Surgical History:  Procedure Laterality Date  . ORTHOPEDIC SURGERY      Current Outpatient Medications  Medication Sig Dispense Refill  . amLODipine (NORVASC) 5 MG tablet Take 1 tablet (5 mg total) by mouth daily. 180 tablet 3  . aspirin EC 81 MG tablet Take 1 tablet (81 mg total) by mouth daily. 90 tablet 3  . lisinopril (ZESTRIL) 10 MG tablet Take 10 mg by mouth daily.    . metoprolol succinate (TOPROL XL) 25 MG 24 hr tablet Take 1 tablet (25 mg total) by mouth daily. 90 tablet 3  . rosuvastatin (CRESTOR) 10 MG tablet Take 1 tablet (10 mg total) by mouth daily. 90 tablet 3  . traMADol (ULTRAM) 50 MG tablet Take 50 mg by mouth every 8 (eight) hours as needed.     No current facility-administered medications for this visit.   Allergies:  Patient has no known allergies.   ROS:   No palpitations or syncope.  Physical Exam: VS:  BP 122/80   Pulse 87   Ht 5\' 5"  (1.651 m)   Wt 209 lb (94.8 kg)   SpO2 97%   BMI 34.78 kg/m , BMI Body mass index is 34.78 kg/m.  Wt Readings from  Last 3 Encounters:  03/07/20 209 lb (94.8 kg)  09/01/19 203 lb (92.1 kg)  06/02/19 203 lb (92.1 kg)    General: Elderly male, appears comfortable at rest. HEENT: Conjunctiva and lids normal.  Wearing a mask. Neck: Supple, no elevated JVP or carotid bruits, no thyromegaly. Lungs: Clear to auscultation, nonlabored breathing at rest. Cardiac: Regular rate and rhythm, no S3 or significant systolic murmur. Extremities: No pitting edema, distal pulses 2+.  ECG:  An ECG dated 06/02/2019 was personally reviewed today and demonstrated:  Sinus rhythm with decreased R wave progression.  Recent Labwork: 08/08/2019: BUN 15; Creatinine, Ser 0.93; Potassium 4.1; Sodium 140 02/27/2020: ALT 25; AST 25     Component Value Date/Time   CHOL 116 02/27/2020 1123   TRIG 34 02/27/2020 1123   HDL 55 02/27/2020 1123   CHOLHDL 2.1 02/27/2020 1123   VLDL 7 02/27/2020 1123   LDLCALC 54 02/27/2020 1123    Other Studies Reviewed Today:  Cardiac CTA 08/15/2019: FINDINGS: A 120 kV prospective scan was triggered in the descending thoracic aorta at 111 HU's. Axial non-contrast 3 mm slices were carried out through the heart. The data set was analyzed on a dedicated work station and scored using the Agatson method. Gantry rotation speed was 250 msecs and  collimation was .6 mm. No beta blockade and 0.8 mg of sl NTG was given. The 3D data set was reconstructed in 5% intervals of the 67-82 % of the R-R cycle. Diastolic phases were analyzed on a dedicated work station using MPR, MIP and VRT modes. The patient received 80 cc of contrast.  Aorta: Normal size. Aortic atherosclerosis noted. No dissection.  Aortic Valve: Trileaflet. No calcifications.  Coronary Arteries: Normal coronary origin. Right dominance.  RCA is a large dominant artery that gives rise to RV branch (calcification noted proximally), PDA and PLVB. There is minimal (0-24%) calcified proximal stenosis, mild (25-49%) calcified  mid stenosis and minimal (0-24%) calcified distal stenosis.  Left main is a large artery that gives rise to LAD and LCX arteries. There is minimal (0-24%) calcified plaque in the distal LM.  LAD is a large vessel that gives rise to small D1 and larger D2. There is severe (70-99%) calcified stenosis in the mid LAD at D1 and minimal (0-24%) distal; there is minimal (0-24%) proximal stenosis followed by severe (70-99%) calcified stenosis in D2.  LCX is a non-dominant artery that gives rise to one OM1 branch. There is mild calcified plaque in the proximal vessel and severe (70-99%) stenosis in OM.  Other findings:  Normal pulmonary vein drainage into the left atrium.  Normal let atrial appendage without a thrombus.  Normal size of the pulmonary artery.  IMPRESSION: 1. Coronary calcium score of 433. This was 58 percentile for age and sex matched control.  2. Normal coronary origin with right dominance.  3. Severe CAD with severe (70-99%) stenosis in the mid LAD, D2 and OM; CADRADS-4.  4. Study will be sent for FFR.  Cardiac CT FFR 08/15/2019: FINDINGS: FFRct analysis was performed on the original cardiac CT angiogram dataset. Diagrammatic representation of the FFRct analysis is provided in a separate PDF document in PACS. This dictation was created using the PDF document and an interactive 3D model of the results. 3D model is not available in the EMR/PACS. Normal FFR range is >0.80.  1. Left Main: findings 0.98, 0.96  2. LAD: findings 0.95, 0.93 0.88; D2 0.89, 0.82  3. LCX: findings 0.96, 0.93 0.89; OM <0.5, <0.5 4. RCA: findings 0.85, 0.84 0.80  IMPRESSION: Findings suggestive of significant obstructive disease in OM and nonobstructive disease in mid LAD and D2.  Assessment and Plan:  1.  CAD with flow-limiting OM branch disease by cardiac CTA with FFR imaging.  Plan is to continue medical therapy in the absence of accelerating angina.  Continue  aspirin, Norvasc, lisinopril, Toprol-XL, and Crestor.  2.  Mixed hyperlipidemia, recent LDL 54 on Crestor.  3.  Essential hypertension, blood pressure better controlled today on combination of Toprol-XL, lisinopril, and Norvasc.  Medication Adjustments/Labs and Tests Ordered: Current medicines are reviewed at length with the patient today.  Concerns regarding medicines are outlined above.   Tests Ordered: No orders of the defined types were placed in this encounter.   Medication Changes: No orders of the defined types were placed in this encounter.   Disposition:  Follow up 6 months in the [place] office.  Signed, Jonelle Sidle, MD, [facility] 03/07/2020 1:13 PM    [facility] at [facility] [address] [place], [place] [address] Phone: [phone]; Fax: [phone]

## 2020-03-07 ENCOUNTER — Other Ambulatory Visit: Payer: Self-pay

## 2020-03-07 ENCOUNTER — Encounter: Payer: Self-pay | Admitting: Cardiology

## 2020-03-07 ENCOUNTER — Ambulatory Visit: Payer: Medicare Other | Admitting: Cardiology

## 2020-03-07 VITALS — BP 122/80 | HR 87 | Ht 65.0 in | Wt 209.0 lb

## 2020-03-07 DIAGNOSIS — I10 Essential (primary) hypertension: Secondary | ICD-10-CM | POA: Diagnosis not present

## 2020-03-07 DIAGNOSIS — E782 Mixed hyperlipidemia: Secondary | ICD-10-CM | POA: Diagnosis not present

## 2020-03-07 DIAGNOSIS — I25119 Atherosclerotic heart disease of native coronary artery with unspecified angina pectoris: Secondary | ICD-10-CM | POA: Diagnosis not present

## 2020-03-07 NOTE — Patient Instructions (Signed)
Medication Instructions:  °Your physician recommends that you continue on your current medications as directed. Please refer to the Current Medication list given to you today. ° °*If you need a refill on your cardiac medications before your next appointment, please call your pharmacy* ° ° °Lab Work: °None today °If you have labs (blood work) drawn today and your tests are completely normal, you will receive your results only by: °• MyChart Message (if you have MyChart) OR °• A paper copy in the mail °If you have any lab test that is abnormal or we need to change your treatment, we will call you to review the results. ° ° °Testing/Procedures: °None today ° ° °Follow-Up: °At [facility], you and your health needs are our priority.  As part of our continuing mission to provide you with exceptional heart care, we have created designated Provider Care Teams.  These Care Teams include your primary Cardiologist (physician) and Advanced Practice Providers (APPs -  Physician Assistants and Nurse Practitioners) who all work together to provide you with the care you need, when you need it. ° °We recommend signing up for the patient portal called "MyChart".  Sign up information is provided on this After Visit Summary.  MyChart is used to connect with patients for Virtual Visits (Telemedicine).  Patients are able to view lab/test results, encounter notes, upcoming appointments, etc.  Non-urgent messages can be sent to your provider as well.   °To learn more about what you can do with MyChart, go to [email or web address].   ° °Your next appointment:   °6 month(s) ° °The format for your next appointment:   °In Person ° °Provider:   °Samuel McDowell, MD ° ° °Other Instructions °None ° ° ° ° °Thank you for choosing [facility] ! ° ° ° ° ° ° ° ° °

## 2020-03-12 NOTE — Progress Notes (Signed)
NEUROLOGY CONSULTATION NOTE  [name] MRN: [id number] DOB: [birth year]  Referring provider: Bjorn Pippin, NP Primary care provider: Kathleene Hazel. Margo Aye, MD  Reason for consult:  Balance problems  HISTORY OF PRESENT ILLNESS: [name] is a 74 year old right-handed male with CAD, HTN, HLD and depression who presents for balance problems.  History supplemented by referring provider's note.  He started noticing balance problems about a year ago and has since progressed.  He has had two falls.  One time, he fell [facility].  Another time, he lost his balance on a dock.  He says when he walks, he sometimes feels himself keep going forward.  When he gets up from a chair, he has to stand a couple of minutes to get his bearings.  No dizziness, lower extremity weakness, numbness and tingling of extremities, double vision, dysphagia, trouble swallowing, or back pain.  A couple of years ago, he thought he saw [place] cong across the street (he is a [place] vet) but has not had any ongoing hallucinations.  He has some mild neck pain and right hip pain due to arthritis.      Hgb A1c was 6.3.  PAST MEDICAL HISTORY: Past Medical History:  Diagnosis Date  . CAD (coronary artery disease)   . Depression   . Essential hypertension   . GERD (gastroesophageal reflux disease)   . Mixed hyperlipidemia     PAST SURGICAL HISTORY: Past Surgical History:  Procedure Laterality Date  . ORTHOPEDIC SURGERY      MEDICATIONS: Current Outpatient Medications on File Prior to Visit  Medication Sig Dispense Refill  . amLODipine (NORVASC) 5 MG tablet Take 1 tablet (5 mg total) by mouth daily. 180 tablet 3  . aspirin EC 81 MG tablet Take 1 tablet (81 mg total) by mouth daily. 90 tablet 3  . lisinopril (ZESTRIL) 10 MG tablet Take 10 mg by mouth daily.    . metoprolol succinate (TOPROL XL) 25 MG 24 hr tablet Take 1 tablet (25 mg total) by mouth daily. 90 tablet 3  . rosuvastatin (CRESTOR) 10 MG tablet Take 1 tablet  (10 mg total) by mouth daily. 90 tablet 3  . traMADol (ULTRAM) 50 MG tablet Take 50 mg by mouth every 8 (eight) hours as needed.     No current facility-administered medications on file prior to visit.    ALLERGIES: No Known Allergies  FAMILY HISTORY: Family History  Problem Relation Age of Onset  . CVA Father        Cerebral hemorrhage   SOCIAL HISTORY: Social History   Socioeconomic History  . Marital status: Married    Spouse name: Not on file  . Number of children: Not on file  . Years of education: Not on file  . Highest education level: Not on file  Occupational History  . Not on file  Tobacco Use  . Smoking status: Former Smoker    Types: Cigarettes  . Smokeless tobacco: Never Used  Vaping Use  . Vaping Use: Never used  Substance and Sexual Activity  . Alcohol use: Never  . Drug use: Not on file  . Sexual activity: Not on file  Other Topics Concern  . Not on file  Social History Narrative  . Not on file   Social Determinants of Health   Financial Resource Strain:   . Difficulty of Paying Living Expenses: Not on file  Food Insecurity:   . Worried About Programme researcher, broadcasting/film/video in the Last Year:  Not on file  . Ran Out of Food in the Last Year: Not on file  Transportation Needs:   . Lack of Transportation (Medical): Not on file  . Lack of Transportation (Non-Medical): Not on file  Physical Activity:   . Days of Exercise per Week: Not on file  . Minutes of Exercise per Session: Not on file  Stress:   . Feeling of Stress : Not on file  Social Connections:   . Frequency of Communication with Friends and Family: Not on file  . Frequency of Social Gatherings with Friends and Family: Not on file  . Attends Religious Services: Not on file  . Active Member of Clubs or Organizations: Not on file  . Attends Banker Meetings: Not on file  . Marital Status: Not on file  Intimate Partner Violence:   . Fear of Current or Ex-Partner: Not on file  .  Emotionally Abused: Not on file  . Physically Abused: Not on file  . Sexually Abused: Not on file    PHYSICAL EXAM: Blood pressure (!) 169/99, pulse (!) 109, height 5\' 5"  (1.651 m), weight 209 lb 9.6 oz (95.1 kg), SpO2 96 %. General: No acute distress.  Patient appears well-groomed.   Head:  Normocephalic/atraumatic Eyes:  fundi examined but not visualized Neck: supple, no paraspinal tenderness, full range of motion Back: No paraspinal tenderness Heart: regular rate and rhythm Lungs: Clear to auscultation bilaterally. Vascular: No carotid bruits. Neurological Exam: Mental status: alert and oriented to person, place, and time, recent and remote memory intact, fund of knowledge intact, attention and concentration intact, speech fluent and not dysarthric, language intact. Cranial nerves: CN I: not tested CN II: pupils equal, round and reactive to light, visual fields intact CN III, IV, VI:  full range of motion, no nystagmus, no ptosis CN V: facial sensation intact CN VII: upper and lower face symmetric CN VIII: hearing intact CN IX, X: gag intact, uvula midline CN XI: sternocleidomastoid and trapezius muscles intact CN XII: tongue midline Bulk & Tone: normal, no fasciculations. Motor:  5/5 throughout  Sensation:  Decreased pinprick sensation of toes and dorsum of left foot.  Vibratory sensation intact. Deep Tendon Reflexes:  2+ throughout, toes downgoing.   Finger to nose testing:  Without dysmetria.   Heel to shin:  Without dysmetria.   Gait:  Wide-based gait.  Good stride.  Difficulty with tandem walk.  Romberg negative.  IMPRESSION: Balance problems.  I cannot identify any specific neurologic etiology.  He does not have weakness, significant neuropathy, signs or myelopathy, signs of a neurodegenerative disorder such as Parkinson's disease, Lewy Body Dementia or PSP.  He has some decreased sensation in the left foot, which he attributes to nerve damage sustained in a leg injury.   No findings on exam that point me to any specific testing.    PLAN: 1.  PCP may want to consider physical therapy 2.  If symptoms progress or change, recommend follow up.  Thank you for allowing me to take part in the care of this patient.  Shon Millet, DO  CC:  Bjorn Pippin, NP  Kathleene Hazel. Margo Aye, MD

## 2020-03-13 ENCOUNTER — Ambulatory Visit: Payer: Medicare Other | Admitting: Neurology

## 2020-03-13 ENCOUNTER — Other Ambulatory Visit: Payer: Self-pay

## 2020-03-13 ENCOUNTER — Encounter: Payer: Self-pay | Admitting: Neurology

## 2020-03-13 VITALS — BP 169/99 | HR 109 | Ht 65.0 in | Wt 209.6 lb

## 2020-03-13 DIAGNOSIS — R2689 Other abnormalities of gait and mobility: Secondary | ICD-10-CM | POA: Diagnosis not present

## 2020-03-13 NOTE — Patient Instructions (Addendum)
I don't appreciate anything specific on your neurologic exam or history to suggest a specific neurologic condition.  I would recommend that your PCP try ordering physical therapy.  Of course, if there are any changes or worsening of symptoms, please make a follow up appointment.

## 2020-05-07 DIAGNOSIS — H348322 Tributary (branch) retinal vein occlusion, left eye, stable: Secondary | ICD-10-CM | POA: Diagnosis not present

## 2020-05-07 DIAGNOSIS — H2512 Age-related nuclear cataract, left eye: Secondary | ICD-10-CM | POA: Diagnosis not present

## 2020-05-07 DIAGNOSIS — H2511 Age-related nuclear cataract, right eye: Secondary | ICD-10-CM | POA: Diagnosis not present

## 2020-05-22 DIAGNOSIS — M1990 Unspecified osteoarthritis, unspecified site: Secondary | ICD-10-CM | POA: Diagnosis not present

## 2020-05-22 DIAGNOSIS — G5750 Tarsal tunnel syndrome, unspecified lower limb: Secondary | ICD-10-CM | POA: Diagnosis not present

## 2020-05-22 DIAGNOSIS — M79672 Pain in left foot: Secondary | ICD-10-CM | POA: Diagnosis not present

## 2020-05-25 DIAGNOSIS — H2512 Age-related nuclear cataract, left eye: Secondary | ICD-10-CM | POA: Diagnosis not present

## 2020-05-25 DIAGNOSIS — Z01818 Encounter for other preprocedural examination: Secondary | ICD-10-CM | POA: Diagnosis not present

## 2020-06-08 DIAGNOSIS — H2511 Age-related nuclear cataract, right eye: Secondary | ICD-10-CM | POA: Diagnosis not present

## 2020-06-12 ENCOUNTER — Other Ambulatory Visit: Payer: Self-pay | Admitting: Cardiology

## 2020-06-12 DIAGNOSIS — M7752 Other enthesopathy of left foot: Secondary | ICD-10-CM | POA: Diagnosis not present

## 2020-06-12 DIAGNOSIS — G5750 Tarsal tunnel syndrome, unspecified lower limb: Secondary | ICD-10-CM | POA: Diagnosis not present

## 2020-06-12 DIAGNOSIS — M79672 Pain in left foot: Secondary | ICD-10-CM | POA: Diagnosis not present

## 2020-06-28 DIAGNOSIS — M25512 Pain in left shoulder: Secondary | ICD-10-CM | POA: Diagnosis not present

## 2020-06-28 DIAGNOSIS — E785 Hyperlipidemia, unspecified: Secondary | ICD-10-CM | POA: Diagnosis not present

## 2020-06-28 DIAGNOSIS — R030 Elevated blood-pressure reading, without diagnosis of hypertension: Secondary | ICD-10-CM | POA: Diagnosis not present

## 2020-06-28 DIAGNOSIS — Z712 Person consulting for explanation of examination or test findings: Secondary | ICD-10-CM | POA: Diagnosis not present

## 2020-06-28 DIAGNOSIS — H6123 Impacted cerumen, bilateral: Secondary | ICD-10-CM | POA: Diagnosis not present

## 2020-06-28 DIAGNOSIS — R7301 Impaired fasting glucose: Secondary | ICD-10-CM | POA: Diagnosis not present

## 2020-06-28 DIAGNOSIS — E039 Hypothyroidism, unspecified: Secondary | ICD-10-CM | POA: Diagnosis not present

## 2020-06-28 DIAGNOSIS — I10 Essential (primary) hypertension: Secondary | ICD-10-CM | POA: Diagnosis not present

## 2020-07-02 DIAGNOSIS — I10 Essential (primary) hypertension: Secondary | ICD-10-CM | POA: Diagnosis not present

## 2020-07-02 DIAGNOSIS — R7301 Impaired fasting glucose: Secondary | ICD-10-CM | POA: Diagnosis not present

## 2020-07-02 DIAGNOSIS — E785 Hyperlipidemia, unspecified: Secondary | ICD-10-CM | POA: Diagnosis not present

## 2020-07-02 DIAGNOSIS — K219 Gastro-esophageal reflux disease without esophagitis: Secondary | ICD-10-CM | POA: Diagnosis not present

## 2020-09-04 ENCOUNTER — Other Ambulatory Visit: Payer: Self-pay | Admitting: Cardiology

## 2020-09-11 ENCOUNTER — Ambulatory Visit: Payer: Medicare Other | Admitting: Cardiology

## 2020-09-11 ENCOUNTER — Other Ambulatory Visit: Payer: Self-pay

## 2020-09-11 ENCOUNTER — Encounter: Payer: Self-pay | Admitting: Physician Assistant

## 2020-09-11 ENCOUNTER — Ambulatory Visit: Payer: Medicare Other | Admitting: Physician Assistant

## 2020-09-11 VITALS — BP 130/78 | HR 75 | Ht 65.0 in | Wt 210.0 lb

## 2020-09-11 DIAGNOSIS — I25119 Atherosclerotic heart disease of native coronary artery with unspecified angina pectoris: Secondary | ICD-10-CM

## 2020-09-11 DIAGNOSIS — F17201 Nicotine dependence, unspecified, in remission: Secondary | ICD-10-CM

## 2020-09-11 DIAGNOSIS — E782 Mixed hyperlipidemia: Secondary | ICD-10-CM | POA: Diagnosis not present

## 2020-09-11 DIAGNOSIS — I10 Essential (primary) hypertension: Secondary | ICD-10-CM

## 2020-09-11 NOTE — Patient Instructions (Addendum)
Medication Instructions:  Your physician recommends that you continue on your current medications as directed. Please refer to the Current Medication list given to you today.  *If you need a refill on your cardiac medications before your next appointment, please call your pharmacy*   Lab Work: None  If you have labs (blood work) drawn today and your tests are completely normal, you will receive your results only by: Marland Kitchen MyChart Message (if you have MyChart) OR . A paper copy in the mail If you have any lab test that is abnormal or we need to change your treatment, we will call you to review the results.   Testing/Procedures: None   Follow-Up: At [facility], you and your health needs are our priority.  As part of our continuing mission to provide you with exceptional heart care, we have created designated Provider Care Teams.  These Care Teams include your primary Cardiologist (physician) and Advanced Practice Providers (APPs -  Physician Assistants and Nurse Practitioners) who all work together to provide you with the care you need, when you need it.  We recommend signing up for the patient portal called "MyChart".  Sign up information is provided on this After Visit Summary.  MyChart is used to connect with patients for Virtual Visits (Telemedicine).  Patients are able to view lab/test results, encounter notes, upcoming appointments, etc.  Non-urgent messages can be sent to your provider as well.   To learn more about what you can do with MyChart, go to [email or web address].    Your next appointment:   6 month(s)  The format for your next appointment:   In Person  Provider:   Rozann Lesches, MD   Other Instructions You have a Ear, Nose, and Throat appointment with Dr. Guido Sander on 09/18/2020 at [place].    [address] [place] [address] [place], [address] [phone]

## 2020-09-11 NOTE — Progress Notes (Signed)
Cardiology Office Note:    Date:  09/11/2020   ID:  [name], DOB 02/19/1946, MRN [id number]  PCP:  Benita Stabile, MD  Cardiologist:  Nona Dell, MD   Referring MD: Benita Stabile, MD   Chief Complaint  Patient presents with  . Follow-up  CAD, HTN  History of Present Illness:    [name] is a 75 y.o. male with a hx of HTN, HLD, and former smoker. 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 He established care with Dr. Diona Browner 05/2019 for chest pain. Given his risk factors, he underwent coronary CT that showed calcium score of 433 with disease in the LAD, D2, and OM. FFR was significant in the OM. Given his lack of progressive angina at follow up, this was treated medically. He has been maintained on norvasc, toprol, lisinopril, ASA, and crestor. He was last seen by Dr. Diona Browner 03/07/20 and was doing well at that time. LDL was 54.   He presents for 6 month follow up.  His main complaints are regarding his balance issues. He was seen by neurology Dr. Everlena Cooper 03/13/20 who recommended PCP order PT. He has not had any PT. He also reports tinnitus from his [employer]. He is stable from a cardiac perspective. He is sedentary due to prior leg injury.  He reports a fall on his dock (back in the summer) - has not had any additional falls, but does get dizzy and "off-balance" with walking.    Past Medical History:  Diagnosis Date  . CAD (coronary artery disease)   . Depression   . Essential hypertension   . GERD (gastroesophageal reflux disease)   . Mixed hyperlipidemia     Past Surgical History:  Procedure Laterality Date  . ORTHOPEDIC SURGERY      Current Medications: Current Meds  Medication Sig  . amLODipine (NORVASC) 5 MG tablet TAKE ONE TABLET BY MOUTH ONCE DAILY.  Marland Kitchen aspirin EC 81 MG tablet Take 1 tablet (81 mg total) by mouth daily.  Marland Kitchen lisinopril (ZESTRIL) 10 MG tablet Take 10 mg by mouth daily.  . metoprolol succinate (TOPROL XL) 25 MG 24 hr tablet Take 1 tablet  (25 mg total) by mouth daily.  . rosuvastatin (CRESTOR) 10 MG tablet TAKE ONE TABLET BY MOUTH ONCE DAILY.  . traMADol (ULTRAM) 50 MG tablet Take 50 mg by mouth every 8 (eight) hours as needed.     Allergies:   Simvastatin   Social History   Socioeconomic History  . Marital status: Married    Spouse name: Not on file  . Number of children: Not on file  . Years of education: Not on file  . Highest education level: Not on file  Occupational History  . Not on file  Tobacco Use  . Smoking status: Former Smoker    Types: Cigarettes  . Smokeless tobacco: Never Used  Vaping Use  . Vaping Use: Never used  Substance and Sexual Activity  . Alcohol use: Never  . Drug use: Not on file  . Sexual activity: Not on file  Other Topics Concern  . Not on file  Social History Narrative   Right handed   Lives with wife in one story home   Social Determinants of Health   Financial Resource Strain: Not on file  Food Insecurity: Not on file  Transportation Needs: Not on file  Physical Activity: Not on file  Stress: Not on file  Social Connections: Not on file  Family History: The patient's family history includes CVA in his father.  ROS:   Please see the history of present illness.     All other systems reviewed and are negative.  EKGs/Labs/Other Studies Reviewed:    The following studies were reviewed today:  CT coronary 07/2019: 1. Coronary calcium score of 433. This was 71 percentile for age and sex matched control.  2. Normal coronary origin with right dominance.  3. Severe CAD with severe (70-99%) stenosis in the mid LAD, D2 and OM; CADRADS-4.  4. Study will be sent for FFR.   EKG:  EKG is  ordered today.  The ekg ordered today demonstrates sinus rhythm HR 72 with old inferior infarct  Recent Labs: 02/27/2020: ALT 25  Recent Lipid Panel    Component Value Date/Time   CHOL 116 02/27/2020 1123   TRIG 34 02/27/2020 1123   HDL 55 02/27/2020 1123   CHOLHDL 2.1  02/27/2020 1123   VLDL 7 02/27/2020 1123   LDLCALC 54 02/27/2020 1123    Physical Exam:    VS:  BP 130/78   Pulse 75   Ht 5\' 5"  (1.651 m)   Wt 210 lb (95.3 kg)   SpO2 97%   BMI 34.95 kg/m     Wt Readings from Last 3 Encounters:  09/11/20 210 lb (95.3 kg)  03/13/20 209 lb 9.6 oz (95.1 kg)  03/07/20 209 lb (94.8 kg)     GEN: elderly male in no acute distress HEENT: Normal NECK: No JVD; No carotid bruits LYMPHATICS: No lymphadenopathy CARDIAC: RRR, no murmurs, rubs, gallops RESPIRATORY:  Clear to auscultation without rales, wheezing or rhonchi  ABDOMEN: Soft, non-tender, non-distended MUSCULOSKELETAL:  No edema; No deformity  SKIN: Warm and dry NEUROLOGIC:  Alert and oriented x 3 PSYCHIATRIC:  Normal affect   ASSESSMENT:    1. Coronary artery disease involving native coronary artery of native heart with angina pectoris (HCC)   2. Mixed hyperlipidemia   3. Essential hypertension   4. Tobacco abuse, in remission    PLAN:    In order of problems listed above:  CAD - FFR significant at OM on CT coronary - continue ASA, amlodipine, BB, and crestor - no change in therapy   Hypertension - doing well on amlodipine, BB, and ACEI - well controlled, elevated on arrival today, controlled on recheck 130/78 - I do not want to increase BP meds with his balance problems   Hyperlipidemia with LDL goal < 70 02/27/2020: Cholesterol 116; HDL 55; LDL Cholesterol 54; Triglycerides 34; VLDL 7 - will recheck lipids in 6 months   Balance problems Tinnitus  - neurology workup was largely negative, advised physical therapy - I will send a message to PCP to see if Dr. Margo Aye can help facilitate PT in his home - I will also send back to Dr. Jenne Pane (ENT - last seen in 2016) - appt made for him   Follow up in 6 months.    Medication Adjustments/Labs and Tests Ordered: Current medicines are reviewed at length with the patient today.  Concerns regarding medicines are outlined above.   Orders Placed This Encounter  Procedures  . EKG 12-Lead   No orders of the defined types were placed in this encounter.   Signed, Marcelino Duster, [place]  09/11/2020 3:15 PM    [facility]

## 2020-09-12 ENCOUNTER — Ambulatory Visit (INDEPENDENT_AMBULATORY_CARE_PROVIDER_SITE_OTHER): Payer: No Typology Code available for payment source | Admitting: Podiatry

## 2020-09-12 DIAGNOSIS — G629 Polyneuropathy, unspecified: Secondary | ICD-10-CM | POA: Diagnosis not present

## 2020-09-12 DIAGNOSIS — E0843 Diabetes mellitus due to underlying condition with diabetic autonomic (poly)neuropathy: Secondary | ICD-10-CM

## 2020-09-12 NOTE — Progress Notes (Signed)
   HPI: 75 y.o. male presenting today as a new patient for evaluation for routine foot exam.  Patient presents today wearing a pair of diabetic shoes and insoles.  He states that the [place] would not approve to have him receive a new pair of shoes and insoles.  Currently he has no pedal complaints, however he states he is borderline diabetic he presents for further treatment and evaluation  Past Medical History:  Diagnosis Date  . CAD (coronary artery disease)   . Depression   . Essential hypertension   . GERD (gastroesophageal reflux disease)   . Mixed hyperlipidemia      Physical Exam: General: The patient is alert and oriented x3 in no acute distress.  Dermatology: Skin is warm, dry and supple bilateral lower extremities. Negative for open lesions or macerations.  Vascular: Palpable pedal pulses bilaterally. No edema or erythema noted. Capillary refill within normal limits.  Neurological: Epicritic and protective threshold grossly intact bilaterally.   Musculoskeletal Exam: Range of motion within normal limits to all pedal and ankle joints bilateral. Muscle strength 5/5 in all groups bilateral.   Assessment: 1.  Bilateral intermittent foot pain 2.  Self-reported borderline diabetic  Plan of Care:  1. Patient evaluated.  2.  Prescription provided for the patient to take to the diabetic shoe store for diabetic shoes and insoles 3.  Continue good foot hygiene 4.  Return to clinic as needed      Edrick Kins, DPM [facility]  Dr. Edrick Kins, DPM    [address],  [address]                Office [phone]  Fax [phone]

## 2020-09-24 ENCOUNTER — Ambulatory Visit: Payer: Medicare Other | Admitting: Physician Assistant

## 2020-12-03 ENCOUNTER — Other Ambulatory Visit: Payer: Self-pay

## 2020-12-03 ENCOUNTER — Ambulatory Visit
Admission: RE | Admit: 2020-12-03 | Discharge: 2020-12-03 | Disposition: A | Discharge status: Home / Self Care | Payer: Medicare Other | Source: Ambulatory Visit | Attending: Family Medicine | Admitting: Family Medicine

## 2020-12-03 VITALS — BP 191/81 | HR 71 | Temp 97.9°F | Resp 20

## 2020-12-03 DIAGNOSIS — Z1152 Encounter for screening for COVID-19: Secondary | ICD-10-CM | POA: Diagnosis not present

## 2020-12-03 DIAGNOSIS — S30861A Insect bite (nonvenomous) of abdominal wall, initial encounter: Secondary | ICD-10-CM | POA: Diagnosis not present

## 2020-12-03 DIAGNOSIS — Z7689 Persons encountering health services in other specified circumstances: Secondary | ICD-10-CM | POA: Diagnosis not present

## 2020-12-03 DIAGNOSIS — R059 Cough, unspecified: Secondary | ICD-10-CM

## 2020-12-03 DIAGNOSIS — R21 Rash and other nonspecific skin eruption: Secondary | ICD-10-CM | POA: Diagnosis not present

## 2020-12-03 DIAGNOSIS — W57XXXA Bitten or stung by nonvenomous insect and other nonvenomous arthropods, initial encounter: Secondary | ICD-10-CM | POA: Diagnosis not present

## 2020-12-03 DIAGNOSIS — I10 Essential (primary) hypertension: Secondary | ICD-10-CM

## 2020-12-03 MED ORDER — DOXYCYCLINE HYCLATE 100 MG PO CAPS: 100.0000 mg | ORAL_CAPSULE | Freq: Two times a day (BID) | ORAL | 0 refills | Status: DC

## 2020-12-03 MED ORDER — PROMETHAZINE-DM 6.25-15 MG/5ML PO SYRP: 5.0000 mL | ORAL_SOLUTION | Freq: Three times a day (TID) | ORAL | 0 refills | Status: DC | PRN

## 2020-12-03 NOTE — ED Provider Notes (Signed)
[facility]    CSN: [id number] Arrival date & time: 12/03/20  1445      History   Chief Complaint Chief Complaint  Patient presents with  . Hoarse  . Insect Bite    HPI [name] is a 75 y.o. male.   HPI Patient presents today with chief complaint.  First complaint patient was bitten by a tick 2 days ago.  He reports removing the tick ENTec from his left lower abdomen.  He has developed a skin rash since removal of the tick which is itchy and tender to touch.  He denies any fever, nausea, vomiting or generalized body aches.  Patient also complains of a persistent cough, sore throat, hoarseness.  Symptoms have been present for over 4 days.  He is vaccinated against flu and COVID.  Unaware of any close contacts with anyone sick with COVID.  Would like a COVID test.  Has been taking homeopathic remedies for management of symptoms without complete relief.  No wheezing or shortness of breath. Blood pressure elevated on arrival however on recheck blood pressure improved to 170/80.  Patient has a history of coronary artery disease and CHF endorses compliance with medication. Past Medical History:  Diagnosis Date  . CAD (coronary artery disease)   . Depression   . Essential hypertension   . GERD (gastroesophageal reflux disease)   . Mixed hyperlipidemia     Patient Active Problem List   Diagnosis Date Noted  . Coronary artery disease involving native coronary artery of native heart with angina pectoris ([place]) 09/11/2020  . Mixed hyperlipidemia 09/11/2020  . Essential hypertension 09/11/2020  . Tobacco abuse, in remission 09/11/2020  . Acid reflux     Past Surgical History:  Procedure Laterality Date  . ORTHOPEDIC SURGERY         Home Medications    Prior to Admission medications   Medication Sig Start Date End Date Taking? Authorizing Provider  doxycycline (VIBRAMYCIN) 100 MG capsule Take 1 capsule (100 mg total) by mouth 2 (two) times daily. 12/03/20  Yes  Scot Jun, FNP  promethazine-dextromethorphan (PROMETHAZINE-DM) 6.25-15 MG/5ML syrup Take 5 mLs by mouth 3 (three) times daily as needed for cough. 12/03/20  Yes Scot Jun, FNP  amLODipine (NORVASC) 5 MG tablet TAKE ONE TABLET BY MOUTH ONCE DAILY. 09/04/20   Satira Sark, MD  aspirin EC 81 MG tablet Take 1 tablet (81 mg total) by mouth daily. 08/16/19   Satira Sark, MD  lisinopril (ZESTRIL) 10 MG tablet Take 10 mg by mouth daily. 04/28/19   [provider]  metoprolol succinate (TOPROL XL) 25 MG 24 hr tablet Take 1 tablet (25 mg total) by mouth daily. 08/16/19   Satira Sark, MD  rosuvastatin (CRESTOR) 10 MG tablet TAKE ONE TABLET BY MOUTH ONCE DAILY. 06/12/20   Satira Sark, MD  traMADol (ULTRAM) 50 MG tablet Take 50 mg by mouth every 8 (eight) hours as needed. 04/14/19   [provider]    Family History Family History  Problem Relation Age of Onset  . CVA Father        Cerebral hemorrhage    Social History Social History   Tobacco Use  . Smoking status: Former Smoker    Types: Cigarettes  . Smokeless tobacco: Never Used  Vaping Use  . Vaping Use: Never used  Substance Use Topics  . Alcohol use: Never     Allergies   Simvastatin   Review of Systems Review  of Systems Pertinent negatives listed in HPI  Physical Exam Triage Vital Signs ED Triage Vitals  Enc Vitals Group     BP 12/03/20 1557 (!) 191/81     Pulse Rate 12/03/20 1557 71     Resp 12/03/20 1557 20     Temp 12/03/20 1557 97.9 F (36.6 C)     Temp src --      SpO2 12/03/20 1557 95 %     Weight --      Height --      Head Circumference --      Peak Flow --      Pain Score 12/03/20 1556 0     Pain Loc --      Pain Edu? --      Excl. in [place]? --    No data found.  Updated Vital Signs BP (!) 191/81   Pulse 71   Temp 97.9 F (36.6 C)   Resp 20   SpO2 95%   Visual Acuity Right Eye Distance:   Left Eye Distance:   Bilateral Distance:     Right Eye Near:   Left Eye Near:    Bilateral Near:     Physical Exam General appearance: Alert, acutely Ill-appearing, no distress Head: Normocephalic, without obvious abnormality, atraumatic ENT: External ears normal Nares w/mucosal edema, congestion, erythematous oropharynx w/o exudate Respiratory: Respirations even , unlabored, coarse lung sound, no rales or wheezing  Heart: Rate and rhythm normal. No gallop or murmurs noted on exam  Abdomen: BS +, no distention, annular erythematous rash left lower abdomen  Extremities: No gross deformities Skin: Skin color, texture, turgor normal. No rashes seen  Psych: Appropriate mood and affect. Neurologic: GCS 15, normal coordination normal gait UC Treatments / Results  Labs (all labs ordered are listed, but only abnormal results are displayed) Labs Reviewed  COVID-19, FLU A+B NAA    EKG   Radiology No results found.  Procedures Procedures (including critical care time)  Medications Ordered in UC Medications - No data to display  Initial Impression / Assessment and Plan / UC Course  I have reviewed the triage vital signs and the nursing notes.  Pertinent labs & imaging results that were available during my care of the patient were reviewed by me and considered in my medical decision making (see chart for details).    Patient presents today for evaluation of cold-like symptoms and and a rash which he reports developed after removal of a tick.  On arrival patient is hypertensive however on recheck blood pressure did improve to 170/80 he is asymptomatic.  Patient has a persistent cough and is acutely ill-appearing however the patient denies feeling poorly.  I did convince him to allow me to collect a COVID flu test.  I am covering him for possible bronchitis along with coverage for the suspected tick bite with doxycycline and for cough prescribing Promethazine DM.  Advised to continue to monitor blood pressure at home.  Strict ER  precautions given if any symptoms of chest pain, shortness of breath, or breathlessness develops.  Patient verbalized understanding and agreement with plan Final Clinical Impressions(s) / UC Diagnoses   Final diagnoses:  Tick bite of abdomen, initial encounter  Cough  Encounter for screening for COVID-19  Accelerated hypertension   Discharge Instructions   None    ED Prescriptions    Medication Sig Dispense Auth. Provider   promethazine-dextromethorphan (PROMETHAZINE-DM) 6.25-15 MG/5ML syrup Take 5 mLs by mouth 3 (three) times daily as needed for  cough. 140 mL Scot Jun, FNP   doxycycline (VIBRAMYCIN) 100 MG capsule Take 1 capsule (100 mg total) by mouth 2 (two) times daily. 20 capsule Scot Jun, FNP     PDMP not reviewed this encounter.   Scot Jun, [place] 12/07/20 [phone]

## 2020-12-03 NOTE — ED Triage Notes (Signed)
Pt presents with c/o hoarseness since Friday but doesn't feel bad, also has tick bite on left abdomen

## 2020-12-04 LAB — COVID-19, FLU A+B NAA
Influenza A, NAA: NOT DETECTED
Influenza B, NAA: NOT DETECTED
SARS-CoV-2, NAA: DETECTED — AB

## 2020-12-21 ENCOUNTER — Other Ambulatory Visit: Payer: Self-pay | Admitting: Cardiology

## 2020-12-31 DIAGNOSIS — H348322 Tributary (branch) retinal vein occlusion, left eye, stable: Secondary | ICD-10-CM | POA: Diagnosis not present

## 2021-01-02 DIAGNOSIS — R7301 Impaired fasting glucose: Secondary | ICD-10-CM | POA: Diagnosis not present

## 2021-01-02 DIAGNOSIS — E039 Hypothyroidism, unspecified: Secondary | ICD-10-CM | POA: Diagnosis not present

## 2021-01-02 DIAGNOSIS — I10 Essential (primary) hypertension: Secondary | ICD-10-CM | POA: Diagnosis not present

## 2021-01-06 ENCOUNTER — Encounter (HOSPITAL_COMMUNITY): Payer: Self-pay | Admitting: *Deleted

## 2021-01-06 ENCOUNTER — Emergency Department (HOSPITAL_COMMUNITY)
Admission: EM | Admit: 2021-01-06 | Discharge: 2021-01-06 | Disposition: A | Discharge status: Home / Self Care | Payer: No Typology Code available for payment source | Attending: Emergency Medicine | Admitting: Emergency Medicine

## 2021-01-06 ENCOUNTER — Other Ambulatory Visit: Payer: Self-pay

## 2021-01-06 ENCOUNTER — Emergency Department (HOSPITAL_COMMUNITY): Payer: No Typology Code available for payment source

## 2021-01-06 DIAGNOSIS — Z7982 Long term (current) use of aspirin: Secondary | ICD-10-CM | POA: Diagnosis not present

## 2021-01-06 DIAGNOSIS — I25119 Atherosclerotic heart disease of native coronary artery with unspecified angina pectoris: Secondary | ICD-10-CM | POA: Insufficient documentation

## 2021-01-06 DIAGNOSIS — Z87891 Personal history of nicotine dependence: Secondary | ICD-10-CM | POA: Insufficient documentation

## 2021-01-06 DIAGNOSIS — S8000XA Contusion of unspecified knee, initial encounter: Secondary | ICD-10-CM

## 2021-01-06 DIAGNOSIS — Z79899 Other long term (current) drug therapy: Secondary | ICD-10-CM | POA: Insufficient documentation

## 2021-01-06 DIAGNOSIS — S8002XA Contusion of left knee, initial encounter: Secondary | ICD-10-CM | POA: Diagnosis not present

## 2021-01-06 DIAGNOSIS — S8992XA Unspecified injury of left lower leg, initial encounter: Secondary | ICD-10-CM | POA: Diagnosis present

## 2021-01-06 DIAGNOSIS — I10 Essential (primary) hypertension: Secondary | ICD-10-CM | POA: Insufficient documentation

## 2021-01-06 DIAGNOSIS — W19XXXA Unspecified fall, initial encounter: Secondary | ICD-10-CM | POA: Insufficient documentation

## 2021-01-06 HISTORY — DX: Post-traumatic stress disorder, unspecified: F43.10

## 2021-01-06 MED ORDER — HYDROCODONE-ACETAMINOPHEN 5-325 MG PO TABS: 1.0000 | ORAL_TABLET | Freq: Four times a day (QID) | ORAL | 0 refills | Status: DC | PRN

## 2021-01-06 NOTE — ED Triage Notes (Signed)
Pt c/o left knee pain after he fell 2 weeks ago. Pt reports he stepped wrong causing him to fall, no dizziness or lightheadedness prior to the fall. Denies hitting his head or LOC.

## 2021-01-06 NOTE — ED Provider Notes (Signed)
[facility] EMERGENCY DEPARTMENT Provider Note   CSN: [id number] Arrival date & time: 01/06/21  0645     History Chief Complaint  Patient presents with   Knee Injury    [name] is a 75 y.o. male.  Patient fell on his left knee.  He has had knee replacement there and complains of some pain  The history is provided by the patient and medical records. No language interpreter was used.  Knee Pain Lower extremity pain location: Left knee. Time since incident: 2 days. Injury: yes   Mechanism of injury: fall   Fall:    Fall occurred: Walk. Pain details:    Quality:  Aching   Radiates to:  Does not radiate   Severity:  Moderate   Onset quality:  Sudden   Duration:  2 days Associated symptoms: no back pain and no fatigue       Past Medical History:  Diagnosis Date   CAD (coronary artery disease)    Depression    Essential hypertension    GERD (gastroesophageal reflux disease)    Mixed hyperlipidemia    PTSD (post-traumatic stress disorder)     Patient Active Problem List   Diagnosis Date Noted   Coronary artery disease involving native coronary artery of native heart with angina pectoris ([place]) 09/11/2020   Mixed hyperlipidemia 09/11/2020   Essential hypertension 09/11/2020   Tobacco abuse, in remission 09/11/2020   Acid reflux     Past Surgical History:  Procedure Laterality Date   ORTHOPEDIC SURGERY         Family History  Problem Relation Age of Onset   CVA Father        Cerebral hemorrhage    Social History   Tobacco Use   Smoking status: Former    Pack years: 0.00    Types: Cigarettes   Smokeless tobacco: Never  Vaping Use   Vaping Use: Never used  Substance Use Topics   Alcohol use: Never   Drug use: Never    Home Medications Prior to Admission medications   Medication Sig Start Date End Date Taking? Authorizing Provider  HYDROcodone-acetaminophen (NORCO/VICODIN) 5-325 MG tablet Take 1 tablet by mouth every 6 (six) hours as needed  for moderate pain. 01/06/21  Yes Milton Ferguson, MD  amLODipine (NORVASC) 5 MG tablet TAKE ONE TABLET BY MOUTH ONCE DAILY. 09/04/20   Satira Sark, MD  aspirin EC 81 MG tablet Take 1 tablet (81 mg total) by mouth daily. 08/16/19   Satira Sark, MD  doxycycline (VIBRAMYCIN) 100 MG capsule Take 1 capsule (100 mg total) by mouth 2 (two) times daily. 12/03/20   Scot Jun, FNP  lisinopril (ZESTRIL) 10 MG tablet Take 10 mg by mouth daily. 04/28/19   [provider]  metoprolol succinate (TOPROL XL) 25 MG 24 hr tablet Take 1 tablet (25 mg total) by mouth daily. 08/16/19   Satira Sark, MD  promethazine-dextromethorphan (PROMETHAZINE-DM) 6.25-15 MG/5ML syrup Take 5 mLs by mouth 3 (three) times daily as needed for cough. 12/03/20   Scot Jun, FNP  rosuvastatin (CRESTOR) 10 MG tablet TAKE ONE TABLET BY MOUTH ONCE DAILY. 12/21/20   Satira Sark, MD  traMADol (ULTRAM) 50 MG tablet Take 50 mg by mouth every 8 (eight) hours as needed. 04/14/19   [provider]    Allergies    Simvastatin  Review of Systems   Review of Systems  Constitutional:  Negative for appetite change and fatigue.  HENT:  Negative for congestion, ear discharge and sinus pressure.   Eyes:  Negative for discharge.  Respiratory:  Negative for cough.   Cardiovascular:  Negative for chest pain.  Gastrointestinal:  Negative for abdominal pain and diarrhea.  Genitourinary:  Negative for frequency and hematuria.  Musculoskeletal:  Negative for back pain.       Left knee pain  Skin:  Negative for rash.  Neurological:  Negative for seizures and headaches.  Psychiatric/Behavioral:  Negative for hallucinations.    Physical Exam Updated Vital Signs BP (!) 169/74   Pulse 72   Temp (!) 97.4 F (36.3 C) (Oral)   Resp 16   Ht 5\' 5"  (1.651 m)   Wt 81.6 kg   SpO2 96%   BMI 29.95 kg/m   Physical Exam Vitals and nursing note reviewed.  Constitutional:      Appearance: He is  well-developed.  HENT:     Head: Normocephalic.     Mouth/Throat:     Mouth: Mucous membranes are moist.  Eyes:     Conjunctiva/sclera: Conjunctivae normal.  Neck:     Trachea: No tracheal deviation.  Cardiovascular:     Heart sounds: No murmur heard. Musculoskeletal:        General: Normal range of motion.     Comments: Tenderness to superior patella  Skin:    General: Skin is warm.  Neurological:     General: No focal deficit present.     Mental Status: He is alert and oriented to person, place, and time.    ED Results / Procedures / Treatments   Labs (all labs ordered are listed, but only abnormal results are displayed) Labs Reviewed - No data to display  EKG None  Radiology DG Knee Complete 4 Views Left  Result Date: 01/06/2021 CLINICAL DATA:  75 year old male status post fall several weeks ago with continued left knee pain. EXAM: LEFT KNEE - COMPLETE 4+ VIEW COMPARISON:  Left tib fib 08/03/2018. FINDINGS: Chronic total knee arthroplasty. Possible joint effusion on the cross-table lateral view. Hardware appears intact and normally aligned. No acute fracture or dislocation identified. Abundant dystrophic calcifications in the patellar tendon region. IMPRESSION: Chronic total knee arthroplasty with possible joint effusion, but no acute osseous abnormality identified about the left knee. Electronically Signed   By: Genevie Ann M.D.   On: 01/06/2021 08:50    Procedures Procedures   Medications Ordered in ED Medications - No data to display  ED Course  I have reviewed the triage vital signs and the nursing notes.  Pertinent labs & imaging results that were available during my care of the patient were reviewed by me and considered in my medical decision making (see chart for details).    MDM Rules/Calculators/A&P                          X-ray shows mild effusion.  Patient with contusion to left knee.  He will be given some Vicodin and will follow up with his orthopedic if  not improving Final Clinical Impression(s) / ED Diagnoses Final diagnoses:  Contusion of knee, unspecified laterality, initial encounter    Rx / DC Orders ED Discharge Orders          Ordered    HYDROcodone-acetaminophen (NORCO/VICODIN) 5-325 MG tablet  Every 6 hours PRN        01/06/21 1100             Milton Ferguson, MD 01/07/21 [phone]

## 2021-01-06 NOTE — Discharge Instructions (Addendum)
Take the hydrocodone for pain every 6 hours if Tylenol or Motrin alone does not help.  Follow-up with the orthopedic doctor if not improving

## 2021-01-12 DIAGNOSIS — D649 Anemia, unspecified: Secondary | ICD-10-CM | POA: Diagnosis not present

## 2021-01-12 DIAGNOSIS — M25562 Pain in left knee: Secondary | ICD-10-CM | POA: Diagnosis not present

## 2021-01-12 DIAGNOSIS — M25551 Pain in right hip: Secondary | ICD-10-CM | POA: Diagnosis not present

## 2021-01-12 DIAGNOSIS — K219 Gastro-esophageal reflux disease without esophagitis: Secondary | ICD-10-CM | POA: Diagnosis not present

## 2021-01-12 DIAGNOSIS — E039 Hypothyroidism, unspecified: Secondary | ICD-10-CM | POA: Diagnosis not present

## 2021-01-12 DIAGNOSIS — E1169 Type 2 diabetes mellitus with other specified complication: Secondary | ICD-10-CM | POA: Diagnosis not present

## 2021-01-12 DIAGNOSIS — E782 Mixed hyperlipidemia: Secondary | ICD-10-CM | POA: Diagnosis not present

## 2021-01-12 DIAGNOSIS — I10 Essential (primary) hypertension: Secondary | ICD-10-CM | POA: Diagnosis not present

## 2021-01-14 DIAGNOSIS — Z96652 Presence of left artificial knee joint: Secondary | ICD-10-CM | POA: Diagnosis not present

## 2021-01-18 ENCOUNTER — Other Ambulatory Visit: Payer: Self-pay

## 2021-01-18 ENCOUNTER — Emergency Department (HOSPITAL_COMMUNITY)
Admission: EM | Admit: 2021-01-18 | Discharge: 2021-01-18 | Disposition: A | Discharge status: Home / Self Care | Payer: No Typology Code available for payment source | Attending: Emergency Medicine | Admitting: Emergency Medicine

## 2021-01-18 ENCOUNTER — Encounter (HOSPITAL_COMMUNITY): Payer: Self-pay | Admitting: *Deleted

## 2021-01-18 DIAGNOSIS — Z7982 Long term (current) use of aspirin: Secondary | ICD-10-CM | POA: Insufficient documentation

## 2021-01-18 DIAGNOSIS — Z87891 Personal history of nicotine dependence: Secondary | ICD-10-CM | POA: Insufficient documentation

## 2021-01-18 DIAGNOSIS — I119 Hypertensive heart disease without heart failure: Secondary | ICD-10-CM | POA: Diagnosis not present

## 2021-01-18 DIAGNOSIS — Z79899 Other long term (current) drug therapy: Secondary | ICD-10-CM | POA: Insufficient documentation

## 2021-01-18 DIAGNOSIS — I2510 Atherosclerotic heart disease of native coronary artery without angina pectoris: Secondary | ICD-10-CM | POA: Insufficient documentation

## 2021-01-18 DIAGNOSIS — M25562 Pain in left knee: Secondary | ICD-10-CM | POA: Diagnosis present

## 2021-01-18 DIAGNOSIS — Z96652 Presence of left artificial knee joint: Secondary | ICD-10-CM | POA: Insufficient documentation

## 2021-01-18 MED ORDER — TRAMADOL HCL 50 MG PO TABS: 50.0000 mg | ORAL_TABLET | Freq: Four times a day (QID) | ORAL | 0 refills | Status: DC | PRN

## 2021-01-18 NOTE — ED Notes (Addendum)
Pt with continued pain to left knee, per pt seen ortho and recommended therapy.  Pt requesting another xray and pain medication.

## 2021-01-18 NOTE — Discharge Instructions (Addendum)
You may wear the knee brace as needed when walking or standing.  You may apply ice packs on and off to your knee as needed.  Follow-up with your orthopedic provider for recheck.

## 2021-01-18 NOTE — ED Triage Notes (Signed)
[place] 12/28/20. States pain is not getting better

## 2021-01-22 NOTE — ED Provider Notes (Signed)
[place] Provider Note   CSN: [id number] Arrival date & time: 01/18/21  1022     History Chief Complaint  Patient presents with   Knee Pain    [name] is a 75 y.o. male.   Knee Pain Associated symptoms: no back pain and no fever       [name] is a 75 y.o. male with prior left knee replacement,  presents to the Emergency Department complaining of persistent left knee pain.  States that he fell 01/04/21 injuring his knee.  He XR of the knee on 6/19.  Here today for pain control and re evaluation.  States the pain is not improving.  He has followed up with orthopedics and states PT was recommended. he is out of pain medication.  Denies any new or worsening symptoms.  No redness, increased swelling or weakness of the leg.  Past Medical History:  Diagnosis Date   CAD (coronary artery disease)    Depression    Essential hypertension    GERD (gastroesophageal reflux disease)    Mixed hyperlipidemia    PTSD (post-traumatic stress disorder)     Patient Active Problem List   Diagnosis Date Noted   Coronary artery disease involving native coronary artery of native heart with angina pectoris ([place]) 09/11/2020   Mixed hyperlipidemia 09/11/2020   Essential hypertension 09/11/2020   Tobacco abuse, in remission 09/11/2020   Acid reflux     Past Surgical History:  Procedure Laterality Date   ORTHOPEDIC SURGERY         Family History  Problem Relation Age of Onset   CVA Father        Cerebral hemorrhage    Social History   Tobacco Use   Smoking status: Former    Pack years: 0.00    Types: Cigarettes   Smokeless tobacco: Never  Vaping Use   Vaping Use: Never used  Substance Use Topics   Alcohol use: Never   Drug use: Never    Home Medications Prior to Admission medications   Medication Sig Start Date End Date Taking? Authorizing Provider  traMADol (ULTRAM) 50 MG tablet Take 1 tablet (50 mg total) by mouth every 6 (six) hours as  needed. 01/18/21  Yes Jabarri Stefanelli, PA-C  amLODipine (NORVASC) 5 MG tablet TAKE ONE TABLET BY MOUTH ONCE DAILY. 09/04/20   Satira Sark, MD  aspirin EC 81 MG tablet Take 1 tablet (81 mg total) by mouth daily. 08/16/19   Satira Sark, MD  doxycycline (VIBRAMYCIN) 100 MG capsule Take 1 capsule (100 mg total) by mouth 2 (two) times daily. 12/03/20   Scot Jun, FNP  lisinopril (ZESTRIL) 10 MG tablet Take 10 mg by mouth daily. 04/28/19   [provider]  metoprolol succinate (TOPROL XL) 25 MG 24 hr tablet Take 1 tablet (25 mg total) by mouth daily. 08/16/19   Satira Sark, MD  promethazine-dextromethorphan (PROMETHAZINE-DM) 6.25-15 MG/5ML syrup Take 5 mLs by mouth 3 (three) times daily as needed for cough. 12/03/20   Scot Jun, FNP  rosuvastatin (CRESTOR) 10 MG tablet TAKE ONE TABLET BY MOUTH ONCE DAILY. 12/21/20   Satira Sark, MD    Allergies    Simvastatin  Review of Systems   Review of Systems  Constitutional:  Negative for chills and fever.  Gastrointestinal:  Negative for nausea and vomiting.  Musculoskeletal:  Positive for arthralgias (left knee pain). Negative for back pain and joint swelling.  Skin:  Negative  for color change and wound.  Neurological:  Negative for dizziness, weakness and numbness.   Physical Exam Updated Vital Signs BP (!) 169/85 (BP Location: Right Arm)   Pulse 77   Temp 98.3 F (36.8 C) (Oral)   Resp 14   SpO2 97%   Physical Exam Constitutional:      Appearance: Normal appearance. He is not ill-appearing or toxic-appearing.  Cardiovascular:     Rate and Rhythm: Normal rate and regular rhythm.     Pulses: Normal pulses.  Pulmonary:     Effort: Pulmonary effort is normal.     Breath sounds: Normal breath sounds.  Musculoskeletal:        General: Tenderness present. No swelling. Normal range of motion.     Comments: Mild ttp of the patella and lateral left knee.  No palpable effusion.  No erythema or excessive  warmth.    Skin:    General: Skin is warm.     Capillary Refill: Capillary refill takes less than 2 seconds.     Findings: No erythema or rash.  Neurological:     General: No focal deficit present.     Mental Status: He is alert.     Sensory: No sensory deficit.     Motor: No weakness.    ED Results / Procedures / Treatments   Labs (all labs ordered are listed, but only abnormal results are displayed) Labs Reviewed - No data to display  EKG None  Radiology DG Knee Complete 4 Views Left  Result Date: 01/06/2021 CLINICAL DATA:  75 year old male status post fall several weeks ago with continued left knee pain. EXAM: LEFT KNEE - COMPLETE 4+ VIEW COMPARISON:  Left tib fib 08/03/2018. FINDINGS: Chronic total knee arthroplasty. Possible joint effusion on the cross-table lateral view. Hardware appears intact and normally aligned. No acute fracture or dislocation identified. Abundant dystrophic calcifications in the patellar tendon region. IMPRESSION: Chronic total knee arthroplasty with possible joint effusion, but no acute osseous abnormality identified about the left knee. Electronically Signed   By: Genevie Ann M.D.   On: 01/06/2021 08:50     Procedures Procedures   Medications Ordered in ED Medications - No data to display  ED Course  I have reviewed the triage vital signs and the nursing notes.  Pertinent labs & imaging results that were available during my care of the patient were reviewed by me and considered in my medical decision making (see chart for details).    MDM Rules/Calculators/A&P                          Pt here for pain control and injury of the left knee nearly one month ago.  Has been evaluated by orthopedics and here after the injury.  No concerning sx's for septic joint or other emergent process.  Short course of pain medication provided    Final Clinical Impression(s) / ED Diagnoses Final diagnoses:  Acute pain of left knee    Rx / DC Orders ED Discharge  Orders          Ordered    traMADol (ULTRAM) 50 MG tablet  Every 6 hours PRN        01/18/21 1236             Kem Parkinson, PA-C 01/22/21 1450    Fredia Sorrow, MD 01/25/21 0725

## 2021-01-24 ENCOUNTER — Encounter (HOSPITAL_COMMUNITY): Payer: Self-pay | Admitting: *Deleted

## 2021-01-24 ENCOUNTER — Emergency Department (HOSPITAL_COMMUNITY)
Admission: EM | Admit: 2021-01-24 | Discharge: 2021-01-24 | Disposition: A | Discharge status: Home / Self Care | Payer: No Typology Code available for payment source | Attending: Emergency Medicine | Admitting: Emergency Medicine

## 2021-01-24 ENCOUNTER — Other Ambulatory Visit: Payer: Self-pay

## 2021-01-24 DIAGNOSIS — M25562 Pain in left knee: Secondary | ICD-10-CM | POA: Insufficient documentation

## 2021-01-24 DIAGNOSIS — G8929 Other chronic pain: Secondary | ICD-10-CM | POA: Insufficient documentation

## 2021-01-24 DIAGNOSIS — I2510 Atherosclerotic heart disease of native coronary artery without angina pectoris: Secondary | ICD-10-CM | POA: Insufficient documentation

## 2021-01-24 DIAGNOSIS — Z87891 Personal history of nicotine dependence: Secondary | ICD-10-CM | POA: Insufficient documentation

## 2021-01-24 DIAGNOSIS — Z7982 Long term (current) use of aspirin: Secondary | ICD-10-CM | POA: Insufficient documentation

## 2021-01-24 DIAGNOSIS — I10 Essential (primary) hypertension: Secondary | ICD-10-CM | POA: Insufficient documentation

## 2021-01-24 DIAGNOSIS — Z79899 Other long term (current) drug therapy: Secondary | ICD-10-CM | POA: Insufficient documentation

## 2021-01-24 MED ORDER — TRAMADOL HCL 50 MG PO TABS: 50.0000 mg | ORAL_TABLET | Freq: Four times a day (QID) | ORAL | 0 refills | Status: DC | PRN

## 2021-01-24 NOTE — Discharge Instructions (Addendum)
You were seen in the ER today for your chronic left knee pain.  Your physical exam was reassuring.  There is not appear to be any deformity burning or any fluid in your knee based on exam.  Provided a short-term prescription for pain.  Please continue to follow-up with your orthopedic surgeon for further management of your pain; call them first thing tomorrow morning to schedule follow-up.  Additionally may find the contact information for the local orthopedic surgeon here in [place].  Return to the ER if you develop new numbness, tingling or weakness in your leg, redness or symptoms.

## 2021-01-24 NOTE — ED Triage Notes (Addendum)
recurrent pain in left knee, here for pain medication

## 2021-01-24 NOTE — ED Provider Notes (Signed)
[facility]Y DEPARTMENT Provider Note   CSN: [id number] Arrival date & time: 01/24/21  1511     History Chief Complaint  Patient presents with   Knee Pain    [name] is a 75 y.o. male with chronic left knee pain following knee replacement who presents with concern for persistent pain, requesting refill of his pain medication.  Was seen here last week and given prescription for tramadol.  Denies any recent falls, new deformities, worsening pain, redness or skin breakdown.  Have personally reviewed this patient's medical records.  He has history of hypertension, coronary artery disease, depression, and GERD.  PMDP reviewed; patient does not receive frequent narcotic prescriptions.  HPI     Past Medical History:  Diagnosis Date   CAD (coronary artery disease)    Depression    Essential hypertension    GERD (gastroesophageal reflux disease)    Mixed hyperlipidemia    PTSD (post-traumatic stress disorder)     Patient Active Problem List   Diagnosis Date Noted   Coronary artery disease involving native coronary artery of native heart with angina pectoris ([place]) 09/11/2020   Mixed hyperlipidemia 09/11/2020   Essential hypertension 09/11/2020   Tobacco abuse, in remission 09/11/2020   Acid reflux     Past Surgical History:  Procedure Laterality Date   ORTHOPEDIC SURGERY         Family History  Problem Relation Age of Onset   CVA Father        Cerebral hemorrhage    Social History   Tobacco Use   Smoking status: Former    Pack years: 0.00    Types: Cigarettes   Smokeless tobacco: Never  Vaping Use   Vaping Use: Never used  Substance Use Topics   Alcohol use: Never   Drug use: Never    Home Medications Prior to Admission medications   Medication Sig Start Date End Date Taking? Authorizing Provider  amLODipine (NORVASC) 5 MG tablet TAKE ONE TABLET BY MOUTH ONCE DAILY. Patient taking differently: Take 5 mg by mouth at bedtime. 09/04/20  Yes  Satira Sark, MD  aspirin EC 81 MG tablet Take 1 tablet (81 mg total) by mouth daily. Patient taking differently: Take 81 mg by mouth every morning. 08/16/19  Yes Satira Sark, MD  lisinopril (ZESTRIL) 40 MG tablet Take 40 mg by mouth daily. 10/04/20  Yes [provider]  omeprazole (PRILOSEC) 20 MG capsule Take 1 capsule by mouth daily. 10/04/20  Yes [provider]  traMADol (ULTRAM) 50 MG tablet Take 1 tablet (50 mg total) by mouth every 6 (six) hours as needed. 01/24/21  Yes Sheilyn Boehlke R, PA-C  doxycycline (VIBRAMYCIN) 100 MG capsule Take 1 capsule (100 mg total) by mouth 2 (two) times daily. Patient not taking: Reported on 01/24/2021 12/03/20   Scot Jun, FNP  lisinopril (ZESTRIL) 10 MG tablet Take 10 mg by mouth daily. 04/28/19   [provider]  metFORMIN (GLUCOPHAGE) 500 MG tablet Take 500 mg by mouth 2 (two) times daily. 01/14/21   [provider]  metoprolol succinate (TOPROL XL) 25 MG 24 hr tablet Take 1 tablet (25 mg total) by mouth daily. 08/16/19   Satira Sark, MD  promethazine-dextromethorphan (PROMETHAZINE-DM) 6.25-15 MG/5ML syrup Take 5 mLs by mouth 3 (three) times daily as needed for cough. Patient not taking: Reported on 01/24/2021 12/03/20   Scot Jun, FNP  rosuvastatin (CRESTOR) 10 MG tablet TAKE ONE TABLET BY MOUTH ONCE DAILY.  12/21/20   Satira Sark, MD    Allergies    Simvastatin  Review of Systems   Review of Systems  Constitutional: Negative.   HENT: Negative.    Respiratory: Negative.    Cardiovascular: Negative.   Gastrointestinal: Negative.   Musculoskeletal:  Positive for arthralgias.  Neurological: Negative.    Physical Exam Updated Vital Signs BP (!) 167/82 (BP Location: Right Arm)   Pulse 100   Temp 98.8 F (37.1 C) (Oral)   Resp 18   SpO2 97%   Physical Exam Vitals and nursing note reviewed.  HENT:     Head: Normocephalic and atraumatic.  Eyes:     General: No scleral  icterus.       Right eye: No discharge.        Left eye: No discharge.     Conjunctiva/sclera: Conjunctivae normal.  Pulmonary:     Effort: Pulmonary effort is normal.  Abdominal:     Palpations: Abdomen is soft.  Musculoskeletal:     Right upper leg: Normal.     Left upper leg: Normal.     Right knee: Normal.     Left knee: No swelling, deformity, effusion, erythema, ecchymosis, lacerations, bony tenderness or crepitus. Normal range of motion. No tenderness. Normal alignment.     Right lower leg: Normal.     Left lower leg: Normal.     Right ankle: Normal.     Right Achilles Tendon: Normal.     Left ankle: Normal.     Left Achilles Tendon: Normal.     Right foot: Normal.     Left foot: Normal.  Skin:    General: Skin is warm and dry.     Capillary Refill: Capillary refill takes less than 2 seconds.  Neurological:     General: No focal deficit present.     Mental Status: He is alert.  Psychiatric:        Mood and Affect: Mood normal.    ED Results / Procedures / Treatments   Labs (all labs ordered are listed, but only abnormal results are displayed) Labs Reviewed - No data to display  EKG None  Radiology No results found.  Procedures Procedures   Medications Ordered in ED Medications - No data to display  ED Course  I have reviewed the triage vital signs and the nursing notes.  Pertinent labs & imaging results that were available during my care of the patient were reviewed by me and considered in my medical decision making (see chart for details).    MDM Rules/Calculators/A&P                         75 year old male presents with concern for chronic left knee pain requesting refill of his pain medication.  Differential diagnosis includes but is not limited to arthritis, pain related to surgical hardware, effusion, acute fracture or dislocation.  Hypertensive on intake, vital signs otherwise normal.  Physical exam revealed normal-appearing postoperative left  knee, well-healed surgical scars from knee replacement several years ago.  No effusion on exam, no tenderness palpation over the medial or lateral joint line.  No bony tenderness palpation at all.  Normal cap refill and sensation in the distal foot, normal range of motion of the foot, ankle, and knee on the left.  Supportive knee sleeve in place.  Removed for exam.  Physical exam reassuring.  Suspect pain is secondary to chronic arthritis and surgical related pain.  Recommend he  follows closely with his orthopedic surgeon, and proceed with PT as recommended by his orthopedic surgeon.  No further work-up warranted needed this time.  [name] voiced understanding of his medical evaluation and treatment.  He was questions was answered to his expressed satisfaction.  Return precautions given.  Patient is well-appearing, stable, and appropriate for discharge at this time.  This chart was dictated using voice recognition software, Dragon. Despite the best efforts of this provider to proofread and correct errors, errors may still occur which can change documentation meaning.  Final Clinical Impression(s) / ED Diagnoses Final diagnoses:  Chronic pain of left knee    Rx / DC Orders ED Discharge Orders          Ordered    traMADol (ULTRAM) 50 MG tablet  Every 6 hours PRN        01/24/21 1749             Shree Espey, Gypsy Balsam, PA-C 01/24/21 1801    Hayden Rasmussen, MD 01/25/21 1139

## 2021-01-25 ENCOUNTER — Telehealth: Payer: Self-pay | Admitting: Orthopaedic Surgery

## 2021-01-25 NOTE — Telephone Encounter (Signed)
Patient called, also had left a voice message, requesting appointment for his left knee. Spoke with patient and discussed his request for appointment to see Dr Luna Glasgow; states Dr Luna Glasgow had done surgery some years ago. Patient relays, as chart indicates, that he has been to [facility] Emergency Room 3 times for the knee pain. We offered appointments with either Dr Luna Glasgow - patient is aware that Dr Luna Glasgow is no longer performing surgery, also offered Dr Amedeo Kinsman, who was our provider on call.  Patient elects to see Dr Luna Glasgow.

## 2021-01-29 ENCOUNTER — Ambulatory Visit: Payer: No Typology Code available for payment source

## 2021-01-29 ENCOUNTER — Encounter: Payer: Self-pay | Admitting: Orthopaedic Surgery

## 2021-01-29 ENCOUNTER — Other Ambulatory Visit: Payer: Self-pay

## 2021-01-29 ENCOUNTER — Ambulatory Visit (INDEPENDENT_AMBULATORY_CARE_PROVIDER_SITE_OTHER): Payer: No Typology Code available for payment source | Admitting: Orthopaedic Surgery

## 2021-01-29 VITALS — BP 173/89 | HR 112 | Ht 65.0 in | Wt 200.0 lb

## 2021-01-29 DIAGNOSIS — M25562 Pain in left knee: Secondary | ICD-10-CM | POA: Diagnosis not present

## 2021-01-29 DIAGNOSIS — G8929 Other chronic pain: Secondary | ICD-10-CM

## 2021-01-29 DIAGNOSIS — Z96652 Presence of left artificial knee joint: Secondary | ICD-10-CM | POA: Diagnosis not present

## 2021-01-29 MED ORDER — HYDROCODONE-ACETAMINOPHEN 5-325 MG PO TABS: ORAL_TABLET | ORAL | 0 refills | Status: DC

## 2021-01-29 NOTE — Progress Notes (Signed)
My left knee hurts.  He has a total knee on the left done by Dr. Wynelle Link over ten years ago.  He has done well with this until December 28, 2020 when he fell and hurt his knee.  He has problems putting weight on it.  He has resumed a cane.    He went to the [facility] ER on 01-06-21 and had X-rays done which were negative for fracture or loosening.  He was evaluated there.  I have read their notes.  I have reviewed the X-rays.  I have independently reviewed and interpreted x-rays of this patient done at another site by another physician or qualified health professional.  His pain continued.  He went back to the ER on 01-24-21  No new X-rays were done.  He was told to see orthopaedics.  He is no better.  He has some slight improvement but has pain when he fully tries to bear weight on the knee on the left.  He has no redness.  He had some swelling but that has gone away.  He has no giving way.  He has no fever or chills.  He is very concerned.  He said he knew he could get in to see me quicker than Dr. Wynelle Link and made appointment here.  ROM of the knee is 5 to 110. He can fully extend but it hurts.  He has more pain over the anterior proximal tibia and slightly medially.  He has no redness, no crepitus, no effusion. He has a cane and a decided limp to the left when walking.  NV intact.  X-rays were done today here, reported separately, of the left knee.  Encounter Diagnosis  Name Primary?   Chronic knee pain after total replacement of left knee joint Yes   I do not see a fracture.  There maybe just very slight subsidence of the anterior tibia on one view but no loosening.  I will get labs of sed rate, CBC and diff, and C-reactive protein.  I will have him see Dr. Wynelle Link for followup.  I will give some pain medicine.  Use Aspercreme, Biofreeze or Voltaren Gel around the tender areas.  I have reviewed the [place] web site prior to prescribing  narcotic medicine for this patient.  Call if any problem.  Precautions discussed.  Electronically Signed Sanjuana Kava, MD 7/12/202210:53 AM

## 2021-01-30 LAB — CBC WITH DIFFERENTIAL/PLATELET
Absolute Monocytes: 419 {cells}/uL (ref 200–950)
Basophils Absolute: 47 {cells}/uL (ref 0–200)
Basophils Relative: 0.8 %
Eosinophils Absolute: 230 {cells}/uL (ref 15–500)
Eosinophils Relative: 3.9 %
HCT: 43.5 % (ref 38.5–50.0)
Hemoglobin: 14.4 g/dL (ref 13.2–17.1)
Lymphs Abs: 1522 {cells}/uL (ref 850–3900)
MCH: 29.3 pg (ref 27.0–33.0)
MCHC: 33.1 g/dL (ref 32.0–36.0)
MCV: 88.4 fL (ref 80.0–100.0)
MPV: 12.2 fL (ref 7.5–12.5)
Monocytes Relative: 7.1 %
Neutro Abs: 3682 {cells}/uL (ref 1500–7800)
Neutrophils Relative %: 62.4 %
Platelets: 258 10*3/uL (ref 140–400)
RBC: 4.92 Million/uL (ref 4.20–5.80)
RDW: 12.3 % (ref 11.0–15.0)
Total Lymphocyte: 25.8 %
WBC: 5.9 10*3/uL (ref 3.8–10.8)

## 2021-01-30 LAB — C-REACTIVE PROTEIN: CRP: 1.1 mg/L (ref <8.0)

## 2021-01-30 LAB — SEDIMENTATION RATE: Sed Rate: 2 mm/h (ref 0–20)

## 2021-02-11 ENCOUNTER — Other Ambulatory Visit (HOSPITAL_COMMUNITY): Payer: Self-pay | Admitting: Neurology

## 2021-02-11 DIAGNOSIS — R296 Repeated falls: Secondary | ICD-10-CM

## 2021-02-18 ENCOUNTER — Ambulatory Visit (HOSPITAL_COMMUNITY): Payer: No Typology Code available for payment source | Attending: Orthopedic Surgery | Admitting: Physical Therapy

## 2021-02-21 ENCOUNTER — Other Ambulatory Visit: Payer: Self-pay

## 2021-02-21 ENCOUNTER — Ambulatory Visit (HOSPITAL_COMMUNITY)
Admission: RE | Admit: 2021-02-21 | Discharge: 2021-02-21 | Disposition: A | Discharge status: Home / Self Care | Payer: No Typology Code available for payment source | Source: Ambulatory Visit | Attending: Neurology | Admitting: Neurology

## 2021-02-21 DIAGNOSIS — R296 Repeated falls: Secondary | ICD-10-CM | POA: Diagnosis not present

## 2021-02-28 DIAGNOSIS — M25552 Pain in left hip: Secondary | ICD-10-CM | POA: Diagnosis not present

## 2021-02-28 DIAGNOSIS — Z96652 Presence of left artificial knee joint: Secondary | ICD-10-CM | POA: Diagnosis not present

## 2021-03-20 ENCOUNTER — Ambulatory Visit (HOSPITAL_COMMUNITY): Payer: No Typology Code available for payment source | Admitting: Physical Therapy

## 2021-04-17 ENCOUNTER — Telehealth: Payer: Self-pay | Admitting: Orthopaedic Surgery

## 2021-04-17 NOTE — Telephone Encounter (Signed)
Patient called this morning to request to see Dr [name] again for ankle/lower leg pain. Relays aware Dr [name] no longer does surgery; however, due to history of surgery with hardware done by Dr [name] several years ago, he would like to see Dr [name] again for this issue. We discussed appointment, and it was noted that his coverage is [facility], which requires referral and authorization by his [place] primary care provider, as our office had previously discussed, due to potential of his [facility] Medicare may not cover services for appointment and/or any related services which he may need. Patient said "good luck with getting through to them."  Please review and advise.

## 2021-04-22 NOTE — Telephone Encounter (Signed)
Patient scheduled; we reviewed the insurance protocol as noted; patient still maintains that she does not wish to 'go through the red tape of the [employer] - said he also talked with his insurance man in [place], who had told him the [employer] would cover services, including surgery, if medically necessary. Patient does not voice understanding. Aware of appointment scheduled this Thursday 04/25/21 with Dr Luna Glasgow per [facility] note. I reminded he is best to still begin the request process through his [place] PCP in event it may be needed for any other services that may be related.

## 2021-04-24 DIAGNOSIS — L720 Epidermal cyst: Secondary | ICD-10-CM | POA: Diagnosis not present

## 2021-04-24 DIAGNOSIS — L258 Unspecified contact dermatitis due to other agents: Secondary | ICD-10-CM | POA: Diagnosis not present

## 2021-04-25 ENCOUNTER — Ambulatory Visit: Payer: No Typology Code available for payment source

## 2021-04-25 ENCOUNTER — Encounter: Payer: Self-pay | Admitting: Orthopaedic Surgery

## 2021-04-25 ENCOUNTER — Other Ambulatory Visit: Payer: Self-pay

## 2021-04-25 ENCOUNTER — Ambulatory Visit: Payer: Medicare Other | Admitting: Orthopaedic Surgery

## 2021-04-25 VITALS — BP 143/73 | HR 74 | Ht 65.0 in | Wt 202.6 lb

## 2021-04-25 DIAGNOSIS — Z96652 Presence of left artificial knee joint: Secondary | ICD-10-CM

## 2021-04-25 DIAGNOSIS — M25562 Pain in left knee: Secondary | ICD-10-CM

## 2021-04-25 DIAGNOSIS — G8929 Other chronic pain: Secondary | ICD-10-CM | POA: Diagnosis not present

## 2021-04-25 DIAGNOSIS — M25572 Pain in left ankle and joints of left foot: Secondary | ICD-10-CM

## 2021-04-25 NOTE — Progress Notes (Signed)
I fell.  He fell and hurt his left lower leg and ankle on March 29, 2021.  He has had pain in the ankle and leg since then.  He saw the other orthopedist about his left knee and was told just to watch it.  He has old healed fracture of left tibia and fibula with deformity.  He has lateral tilt of ankle mortise and two medial screws in the ankle.  He says his gait is off now and the ankle and foot hurt most of the time.  X-rays were done of the left lower leg and ankle, reported separately.  He has deformity of the left lower leg. ROM of knee is full.  NV intact.  ROM of ankle is full but tender and has crepitus.  He has a limp to the left and uses a cane.  Encounter Diagnoses  Name Primary?   Chronic knee pain after total replacement of left knee joint Yes   Pain in left ankle and joints of left foot    I have explained the arthritis in the ankle has finally "caught  up with him" and giving him more problems.  He was given an ankle brace.  He wants the screws removed from the ankle but they are not exposed and not swelling around the screws.  His problem is the degenerative changes of his ankle and the lateral tilt.  We will try a brace.  He may need custom shoe to balance him better.  Return in one month.  Call if any problem.  Precautions discussed.  Electronically Signed Sanjuana Kava, MD 10/6/202211:55 AM

## 2021-04-25 NOTE — Progress Notes (Deleted)
Subjective:    Patient ID: [name], male    DOB: 15-Aug-1945, 75 y.o.   MRN: [id number]  HPI    Review of Systems     Objective:   Physical Exam        Assessment & Plan:   ***

## 2021-04-29 DIAGNOSIS — M79672 Pain in left foot: Secondary | ICD-10-CM | POA: Diagnosis not present

## 2021-04-29 DIAGNOSIS — I739 Peripheral vascular disease, unspecified: Secondary | ICD-10-CM | POA: Diagnosis not present

## 2021-04-29 DIAGNOSIS — M79674 Pain in right toe(s): Secondary | ICD-10-CM | POA: Diagnosis not present

## 2021-04-29 DIAGNOSIS — M79671 Pain in right foot: Secondary | ICD-10-CM | POA: Diagnosis not present

## 2021-04-29 DIAGNOSIS — M79675 Pain in left toe(s): Secondary | ICD-10-CM | POA: Diagnosis not present

## 2021-04-29 DIAGNOSIS — L110 Acquired keratosis follicularis: Secondary | ICD-10-CM | POA: Diagnosis not present

## 2021-05-11 DIAGNOSIS — Z23 Encounter for immunization: Secondary | ICD-10-CM | POA: Diagnosis not present

## 2021-05-13 ENCOUNTER — Other Ambulatory Visit: Payer: Self-pay

## 2021-05-13 ENCOUNTER — Ambulatory Visit (INDEPENDENT_AMBULATORY_CARE_PROVIDER_SITE_OTHER): Payer: Medicare Other | Admitting: Podiatry

## 2021-05-13 ENCOUNTER — Ambulatory Visit (INDEPENDENT_AMBULATORY_CARE_PROVIDER_SITE_OTHER): Payer: Medicare Other

## 2021-05-13 DIAGNOSIS — M19079 Primary osteoarthritis, unspecified ankle and foot: Secondary | ICD-10-CM

## 2021-05-13 DIAGNOSIS — M21542 Acquired clubfoot, left foot: Secondary | ICD-10-CM

## 2021-05-13 DIAGNOSIS — S82231S Displaced oblique fracture of shaft of right tibia, sequela: Secondary | ICD-10-CM

## 2021-05-13 DIAGNOSIS — M19072 Primary osteoarthritis, left ankle and foot: Secondary | ICD-10-CM

## 2021-05-14 DIAGNOSIS — E782 Mixed hyperlipidemia: Secondary | ICD-10-CM | POA: Diagnosis not present

## 2021-05-14 DIAGNOSIS — E039 Hypothyroidism, unspecified: Secondary | ICD-10-CM | POA: Diagnosis not present

## 2021-05-14 DIAGNOSIS — E1169 Type 2 diabetes mellitus with other specified complication: Secondary | ICD-10-CM | POA: Diagnosis not present

## 2021-05-15 NOTE — Progress Notes (Signed)
Subjective:  Patient ID: [name], male    DOB: Mar 05, 1946,  MRN: [id number]  Chief Complaint  Patient presents with   Foot Pain      NP previous ankle injury screws in ankle in the 80's pain    75 y.o. male presents with the above complaint. History confirmed with patient.  He previously suffered a severe ankle injury fracturing the ankle requiring surgery in the 80s chopping wood.  He then later broke the leg and was told it did not require surgery.  He has continued stiffness and pain in the ankle has difficulty walking from this  Objective:  Physical Exam: warm, good capillary refill, no trophic changes or ulcerative lesions, normal DP and PT pulses, and normal sensory exam. Left Foot: Deformity of the lower leg, he has a varus alignment and pain and stiffness in the ankle with limited range of motion with local edema   Multiple views left ankle radiographs show a severe deformity of the left tibia, ankle in varus and significant degenerative changes of the joint Assessment:   1. Ankle arthritis   2. Ankle varus, acquired, left   3. Closed displaced oblique fracture of shaft of right tibia, sequela      Plan:  Patient was evaluated and treated and all questions answered.  Reviewed the radiographic and clinical findings with him in detail.  I discussed that he has a severe deformity of the tibia and degenerative changes of the ankle.  Discussed surgical nonsurgical treatment: Bracing injections and surgical treatment with arthrodesis or ankle replacement.  Discussed with him due to the severe deformities and not something that I would be willing to take on for him but could offer bracing and injections.  I wrote him a prescription for an [place] brace from [facility] and performed an intra-articular ankle injection following a Betadine prep with sterile technique utilizing 20 mg of Kenalog and 4 mg of dexamethasone.  He tolerated this well.  Referral be sent to [facility]  orthopedics for him.  No follow-ups on file.

## 2021-05-20 ENCOUNTER — Other Ambulatory Visit: Payer: Self-pay

## 2021-05-20 ENCOUNTER — Ambulatory Visit (INDEPENDENT_AMBULATORY_CARE_PROVIDER_SITE_OTHER): Payer: Medicare Other | Admitting: Urology

## 2021-05-20 ENCOUNTER — Encounter: Payer: Self-pay | Admitting: Urology

## 2021-05-20 VITALS — BP 173/76 | HR 84 | Temp 98.0°F | Wt 205.0 lb

## 2021-05-20 DIAGNOSIS — N3941 Urge incontinence: Secondary | ICD-10-CM

## 2021-05-20 LAB — URINALYSIS, ROUTINE W REFLEX MICROSCOPIC
Bilirubin, UA: NEGATIVE
Glucose, UA: NEGATIVE
Ketones, UA: NEGATIVE
Nitrite, UA: NEGATIVE
Protein,UA: NEGATIVE
RBC, UA: NEGATIVE
Specific Gravity, UA: 1.015 (ref 1.005–1.030)
Urobilinogen, Ur: 1 mg/dL (ref 0.2–1.0)
pH, UA: 6.5 (ref 5.0–7.5)

## 2021-05-20 LAB — MICROSCOPIC EXAMINATION
Bacteria, UA: NONE SEEN
RBC, Urine: NONE SEEN /hpf (ref 0–2)
Renal Epithel, UA: NONE SEEN /hpf

## 2021-05-20 MED ORDER — GEMTESA 75 MG PO TABS: 75.0000 mg | ORAL_TABLET | Freq: Every day | ORAL | 0 refills | Status: DC

## 2021-05-20 NOTE — Progress Notes (Signed)
Urological Symptom Review  Patient is experiencing the following symptoms: Hard to postpone urination Get up at night to urinate Leakage of urine   Review of Systems  Gastrointestinal (upper)  : Negative for upper GI symptoms  Gastrointestinal (lower) : Negative for lower GI symptoms  Constitutional : Negative for symptoms  Skin: Negative for skin symptoms  Eyes: Negative for eye symptoms  Ear/Nose/Throat : Negative for Ear/Nose/Throat symptoms  Hematologic/Lymphatic: Negative for Hematologic/Lymphatic symptoms  Cardiovascular : Negative for cardiovascular symptoms  Respiratory : Negative for respiratory symptoms  Endocrine: Negative for endocrine symptoms  Musculoskeletal: Negative for musculoskeletal symptoms  Neurological: Negative for neurological symptoms  Psychologic: Negative for psychiatric symptoms

## 2021-05-20 NOTE — Progress Notes (Signed)
post void residual = 0 ml

## 2021-05-20 NOTE — Progress Notes (Signed)
Assessment: 1. Urge incontinence of urine     Plan: Diagnosis and management options for urge incontinence discussed with the patient today.  Since including avoidance of dietary irritants, behavioral modification, medical therapy, neuromodulation, and chemodenervation discussed.  Recommend a trial of medical therapy. Trial of Gemtesa 75 mg daily.  Samples given. Return to office 1 month   Chief Complaint:  Chief Complaint  Patient presents with   Urinary Incontinence     History of Present Illness:  [name] is a 75 y.o. year old male who is seen in consultation from Celene Squibb, MD for evaluation of urinary incontinence.  He is having urgency with associated incontinence.  He also reports some postvoid incontinence.  His symptoms have been present for approximately 1 year.  He reports voiding with a good stream.  He has nocturia x1.  No dysuria or gross hematuria.  No recent UTIs.  No prior medical therapy for his symptoms.  No problems with constipation.  Past Medical History:  Past Medical History:  Diagnosis Date   CAD (coronary artery disease)    Depression    Essential hypertension    GERD (gastroesophageal reflux disease)    Mixed hyperlipidemia    PTSD (post-traumatic stress disorder)     Past Surgical History:  Past Surgical History:  Procedure Laterality Date   ORTHOPEDIC SURGERY      Allergies:  Allergies  Allergen Reactions   Simvastatin Rash    Family History:  Family History  Problem Relation Age of Onset   CVA Father        Cerebral hemorrhage    Social History:  Social History   Tobacco Use   Smoking status: Former    Types: Cigarettes   Smokeless tobacco: Never  Vaping Use   Vaping Use: Never used  Substance Use Topics   Alcohol use: Never   Drug use: Never    Review of symptoms:  Constitutional:  Negative for unexplained weight loss, night sweats, fever, chills ENT:  Negative for nose bleeds, sinus pain, painful  swallowing CV:  Negative for chest pain, shortness of breath, exercise intolerance, palpitations, loss of consciousness Resp:  Negative for cough, wheezing, shortness of breath GI:  Negative for nausea, vomiting, diarrhea, bloody stools GU:  Positives noted in HPI; otherwise negative for gross hematuria, dysuria Neuro:  Negative for seizures, poor balance, limb weakness, slurred speech Psych:  Negative for lack of energy, depression, anxiety Endocrine:  Negative for polydipsia, polyuria, symptoms of hypoglycemia (dizziness, hunger, sweating) Hematologic:  Negative for anemia, purpura, petechia, prolonged or excessive bleeding, use of anticoagulants  Allergic:  Negative for difficulty breathing or choking as a result of exposure to anything; no shellfish allergy; no allergic response (rash/itch) to materials, foods  Physical exam: BP (!) 173/76   Pulse 84   Temp 98 F (36.7 C)   Wt 205 lb (93 kg)   BMI 34.11 kg/m  GENERAL APPEARANCE:  Well appearing, well developed, well nourished, NAD HEENT: Atraumatic, Normocephalic, oropharynx clear. NECK: Supple without lymphadenopathy or thyromegaly. LUNGS: Clear to auscultation bilaterally. HEART: Regular Rate and Rhythm without murmurs, gallops, or rubs. ABDOMEN: Soft, non-tender, No Masses. EXTREMITIES: Moves all extremities well.  Without clubbing, cyanosis, or edema. NEUROLOGIC:  Alert and oriented x 3, normal gait, CN II-XII grossly intact.  MENTAL STATUS:  Appropriate. BACK:  Non-tender to palpation.  No CVAT SKIN:  Warm, dry and intact.   GU: Penis:  uncircumcised Meatus: Normal Scrotum: normal, no masses Testis: normal  without masses bilateral Epididymis: normal Prostate: 40 g, NT, no nodules Rectum: Normal tone,  no masses or tenderness   Results: U/A:  0-5 WBC, few bacteria  PVR:  0 ml

## 2021-05-21 ENCOUNTER — Ambulatory Visit: Payer: No Typology Code available for payment source | Admitting: Orthopaedic Surgery

## 2021-05-21 DIAGNOSIS — M25562 Pain in left knee: Secondary | ICD-10-CM | POA: Diagnosis not present

## 2021-05-21 DIAGNOSIS — E039 Hypothyroidism, unspecified: Secondary | ICD-10-CM | POA: Diagnosis not present

## 2021-05-21 DIAGNOSIS — Z0001 Encounter for general adult medical examination with abnormal findings: Secondary | ICD-10-CM | POA: Diagnosis not present

## 2021-05-21 DIAGNOSIS — M25551 Pain in right hip: Secondary | ICD-10-CM | POA: Diagnosis not present

## 2021-05-21 DIAGNOSIS — K219 Gastro-esophageal reflux disease without esophagitis: Secondary | ICD-10-CM | POA: Diagnosis not present

## 2021-05-21 DIAGNOSIS — Z6331 Absence of family member due to military deployment: Secondary | ICD-10-CM | POA: Diagnosis not present

## 2021-05-21 DIAGNOSIS — E1169 Type 2 diabetes mellitus with other specified complication: Secondary | ICD-10-CM | POA: Diagnosis not present

## 2021-05-21 DIAGNOSIS — I10 Essential (primary) hypertension: Secondary | ICD-10-CM | POA: Diagnosis not present

## 2021-05-21 DIAGNOSIS — E782 Mixed hyperlipidemia: Secondary | ICD-10-CM | POA: Diagnosis not present

## 2021-05-21 DIAGNOSIS — D649 Anemia, unspecified: Secondary | ICD-10-CM | POA: Diagnosis not present

## 2021-05-28 DIAGNOSIS — Z1283 Encounter for screening for malignant neoplasm of skin: Secondary | ICD-10-CM | POA: Diagnosis not present

## 2021-05-28 DIAGNOSIS — D485 Neoplasm of uncertain behavior of skin: Secondary | ICD-10-CM | POA: Diagnosis not present

## 2021-05-28 DIAGNOSIS — L570 Actinic keratosis: Secondary | ICD-10-CM | POA: Diagnosis not present

## 2021-05-28 DIAGNOSIS — D225 Melanocytic nevi of trunk: Secondary | ICD-10-CM | POA: Diagnosis not present

## 2021-05-28 DIAGNOSIS — X32XXXD Exposure to sunlight, subsequent encounter: Secondary | ICD-10-CM | POA: Diagnosis not present

## 2021-06-12 DIAGNOSIS — S82232P Displaced oblique fracture of shaft of left tibia, subsequent encounter for closed fracture with malunion: Secondary | ICD-10-CM | POA: Diagnosis not present

## 2021-06-12 DIAGNOSIS — S82432P Displaced oblique fracture of shaft of left fibula, subsequent encounter for closed fracture with malunion: Secondary | ICD-10-CM | POA: Diagnosis not present

## 2021-06-12 DIAGNOSIS — T8484XA Pain due to internal orthopedic prosthetic devices, implants and grafts, initial encounter: Secondary | ICD-10-CM | POA: Diagnosis not present

## 2021-06-12 DIAGNOSIS — M21542 Acquired clubfoot, left foot: Secondary | ICD-10-CM | POA: Diagnosis not present

## 2021-06-12 DIAGNOSIS — X58XXXD Exposure to other specified factors, subsequent encounter: Secondary | ICD-10-CM | POA: Diagnosis not present

## 2021-06-12 DIAGNOSIS — M19172 Post-traumatic osteoarthritis, left ankle and foot: Secondary | ICD-10-CM | POA: Diagnosis not present

## 2021-06-17 ENCOUNTER — Encounter: Payer: Self-pay | Admitting: Urology

## 2021-06-17 ENCOUNTER — Ambulatory Visit (INDEPENDENT_AMBULATORY_CARE_PROVIDER_SITE_OTHER): Payer: Medicare Other | Admitting: Urology

## 2021-06-17 ENCOUNTER — Other Ambulatory Visit: Payer: Self-pay

## 2021-06-17 VITALS — BP 177/82 | HR 71

## 2021-06-17 DIAGNOSIS — N3941 Urge incontinence: Secondary | ICD-10-CM | POA: Diagnosis not present

## 2021-06-17 LAB — MICROSCOPIC EXAMINATION
RBC, Urine: NONE SEEN /hpf (ref 0–2)
Renal Epithel, UA: NONE SEEN /hpf

## 2021-06-17 LAB — URINALYSIS, ROUTINE W REFLEX MICROSCOPIC
Bilirubin, UA: NEGATIVE
Glucose, UA: NEGATIVE
Ketones, UA: NEGATIVE
Nitrite, UA: NEGATIVE
Protein,UA: NEGATIVE
RBC, UA: NEGATIVE
Specific Gravity, UA: 1.02 (ref 1.005–1.030)
Urobilinogen, Ur: 0.2 mg/dL (ref 0.2–1.0)
pH, UA: 6.5 (ref 5.0–7.5)

## 2021-06-17 MED ORDER — GEMTESA 75 MG PO TABS
75.0000 mg | ORAL_TABLET | Freq: Every day | ORAL | 11 refills | Status: DC
Start: 2021-06-17 — End: 2022-07-18

## 2021-06-17 NOTE — Progress Notes (Signed)
   Assessment: 1. Urge incontinence of urine      Plan: Continue Gemtesa 75 mg.  Samples given and rx sent. Return to office in 3 months  Chief Complaint:  Chief Complaint  Patient presents with   Urinary Incontinence     History of Present Illness:  [name] is a 75 y.o. year old male who is seen for further evaluation of urinary incontinence.  At his visit on 05/20/21, he reported symptoms of urgency with associated incontinence.  He also reported some postvoid incontinence.  His symptoms were present for approximately 1 year.  He reported voiding with a good stream with nocturia x1.  No dysuria or gross hematuria.  No recent UTIs.  No prior medical therapy for his symptoms.  No problems with constipation. PVR = 0 ml. He was given a trial of Gemtesa 75 mg daily.  He returns today for follow-up.  He reports improvement in his urgency and incontinence with the medication.  No side effects noted.   IPSS = 7 today.  Portions of the above documentation were copied from a prior visit for review purposes only.  Past Medical History:  Past Medical History:  Diagnosis Date   CAD (coronary artery disease)    Depression    Essential hypertension    GERD (gastroesophageal reflux disease)    Mixed hyperlipidemia    PTSD (post-traumatic stress disorder)     Past Surgical History:  Past Surgical History:  Procedure Laterality Date   ORTHOPEDIC SURGERY      Allergies:  Allergies  Allergen Reactions   Simvastatin Rash    Family History:  Family History  Problem Relation Age of Onset   CVA Father        Cerebral hemorrhage    Social History:  Social History   Tobacco Use   Smoking status: Former    Types: Cigarettes   Smokeless tobacco: Never  Vaping Use   Vaping Use: Never used  Substance Use Topics   Alcohol use: Never   Drug use: Never    ROS: Constitutional:  Negative for fever, chills, weight loss CV: Negative for chest pain, previous MI,  hypertension Respiratory:  Negative for shortness of breath, wheezing, sleep apnea, frequent cough GI:  Negative for nausea, vomiting, bloody stool, GERD  Physical exam: BP (!) 177/82   Pulse 71  GENERAL APPEARANCE:  Well appearing, well developed, well nourished, NAD HEENT:  Atraumatic, normocephalic, oropharynx clear NECK:  Supple without lymphadenopathy or thyromegaly ABDOMEN:  Soft, non-tender, no masses EXTREMITIES:  Moves all extremities well, without clubbing, cyanosis, or edema NEUROLOGIC:  Alert and oriented x 3, normal gait, CN II-XII grossly intact MENTAL STATUS:  appropriate BACK:  Non-tender to palpation, No CVAT SKIN:  Warm, dry, and intact  Results: U/A:  0-5 WBC, few bacteria  PVR:  1 ml

## 2021-06-17 NOTE — Progress Notes (Signed)
Urological Symptom Review  Patient is experiencing the following symptoms: Frequent urination Hard to postpone urination Get up at night to urinate Leakage of urine   Review of Systems  Gastrointestinal (upper)  : Negative for upper GI symptoms  Gastrointestinal (lower) : Negative for lower GI symptoms  Constitutional : Negative for symptoms  Skin: Negative for skin symptoms  Eyes: Negative for eye symptoms  Ear/Nose/Throat : Negative for Ear/Nose/Throat symptoms  Hematologic/Lymphatic: Negative for Hematologic/Lymphatic symptoms  Cardiovascular : Negative for cardiovascular symptoms  Respiratory : Negative for respiratory symptoms  Endocrine: Negative for endocrine symptoms  Musculoskeletal: Negative for musculoskeletal symptoms  Neurological: Negative for neurological symptoms  Psychologic: Negative for psychiatric symptoms 

## 2021-06-25 ENCOUNTER — Telehealth: Payer: Self-pay

## 2021-06-25 NOTE — Telephone Encounter (Signed)
PA started on Covermymeds for [employer]

## 2021-07-04 ENCOUNTER — Telehealth: Payer: Self-pay

## 2021-07-04 NOTE — Telephone Encounter (Signed)
Appeal form completed for [facility] for [place]. Faxed to [phone]

## 2021-08-09 ENCOUNTER — Telehealth: Payer: Self-pay | Admitting: Podiatry

## 2021-08-09 NOTE — Telephone Encounter (Signed)
Please advise 

## 2021-08-09 NOTE — Telephone Encounter (Signed)
Patient would like a referral sent over to [facility] for Ankle replacement

## 2021-08-12 ENCOUNTER — Telehealth: Payer: Self-pay | Admitting: *Deleted

## 2021-08-12 NOTE — Telephone Encounter (Signed)
Returned the call to patient,giving instructions per Dr Sherryle Lis thru voicemessage.

## 2021-08-15 DIAGNOSIS — E782 Mixed hyperlipidemia: Secondary | ICD-10-CM | POA: Diagnosis not present

## 2021-08-15 DIAGNOSIS — E1169 Type 2 diabetes mellitus with other specified complication: Secondary | ICD-10-CM | POA: Diagnosis not present

## 2021-08-15 DIAGNOSIS — E039 Hypothyroidism, unspecified: Secondary | ICD-10-CM | POA: Diagnosis not present

## 2021-08-21 DIAGNOSIS — E1169 Type 2 diabetes mellitus with other specified complication: Secondary | ICD-10-CM | POA: Diagnosis not present

## 2021-08-21 DIAGNOSIS — M25551 Pain in right hip: Secondary | ICD-10-CM | POA: Diagnosis not present

## 2021-08-21 DIAGNOSIS — M6530 Trigger finger, unspecified finger: Secondary | ICD-10-CM | POA: Diagnosis not present

## 2021-08-21 DIAGNOSIS — Z634 Disappearance and death of family member: Secondary | ICD-10-CM | POA: Diagnosis not present

## 2021-08-21 DIAGNOSIS — E782 Mixed hyperlipidemia: Secondary | ICD-10-CM | POA: Diagnosis not present

## 2021-08-21 DIAGNOSIS — E039 Hypothyroidism, unspecified: Secondary | ICD-10-CM | POA: Diagnosis not present

## 2021-08-21 DIAGNOSIS — I10 Essential (primary) hypertension: Secondary | ICD-10-CM | POA: Diagnosis not present

## 2021-08-21 DIAGNOSIS — K219 Gastro-esophageal reflux disease without esophagitis: Secondary | ICD-10-CM | POA: Diagnosis not present

## 2021-08-21 DIAGNOSIS — D649 Anemia, unspecified: Secondary | ICD-10-CM | POA: Diagnosis not present

## 2021-08-22 DIAGNOSIS — Z87891 Personal history of nicotine dependence: Secondary | ICD-10-CM | POA: Diagnosis not present

## 2021-08-22 DIAGNOSIS — S82232P Displaced oblique fracture of shaft of left tibia, subsequent encounter for closed fracture with malunion: Secondary | ICD-10-CM | POA: Diagnosis not present

## 2021-08-22 DIAGNOSIS — M21542 Acquired clubfoot, left foot: Secondary | ICD-10-CM | POA: Diagnosis not present

## 2021-08-22 DIAGNOSIS — M19172 Post-traumatic osteoarthritis, left ankle and foot: Secondary | ICD-10-CM | POA: Diagnosis not present

## 2021-08-22 DIAGNOSIS — S82432P Displaced oblique fracture of shaft of left fibula, subsequent encounter for closed fracture with malunion: Secondary | ICD-10-CM | POA: Diagnosis not present

## 2021-09-06 DIAGNOSIS — M65332 Trigger finger, left middle finger: Secondary | ICD-10-CM | POA: Diagnosis not present

## 2021-09-06 DIAGNOSIS — M79641 Pain in right hand: Secondary | ICD-10-CM | POA: Diagnosis not present

## 2021-09-06 DIAGNOSIS — M79642 Pain in left hand: Secondary | ICD-10-CM | POA: Diagnosis not present

## 2021-09-06 DIAGNOSIS — M65331 Trigger finger, right middle finger: Secondary | ICD-10-CM | POA: Diagnosis not present

## 2021-09-06 DIAGNOSIS — M65352 Trigger finger, left little finger: Secondary | ICD-10-CM | POA: Diagnosis not present

## 2021-09-10 ENCOUNTER — Ambulatory Visit: Payer: No Typology Code available for payment source | Admitting: Urology

## 2021-09-10 DIAGNOSIS — G8929 Other chronic pain: Secondary | ICD-10-CM | POA: Diagnosis not present

## 2021-09-10 DIAGNOSIS — S82202P Unspecified fracture of shaft of left tibia, subsequent encounter for closed fracture with malunion: Secondary | ICD-10-CM | POA: Diagnosis not present

## 2021-09-10 DIAGNOSIS — N3941 Urge incontinence: Secondary | ICD-10-CM

## 2021-09-10 DIAGNOSIS — M79672 Pain in left foot: Secondary | ICD-10-CM | POA: Diagnosis not present

## 2021-09-10 DIAGNOSIS — Z96652 Presence of left artificial knee joint: Secondary | ICD-10-CM | POA: Diagnosis not present

## 2021-09-10 DIAGNOSIS — M25572 Pain in left ankle and joints of left foot: Secondary | ICD-10-CM | POA: Diagnosis not present

## 2021-09-10 DIAGNOSIS — M19172 Post-traumatic osteoarthritis, left ankle and foot: Secondary | ICD-10-CM | POA: Diagnosis not present

## 2021-09-10 NOTE — Progress Notes (Unsigned)
° °  Assessment: 1. Urge incontinence of urine      Plan: Continue Gemtesa 75 mg.  Samples given and rx sent. Return to office in 3 months  Chief Complaint:  No chief complaint on file.    History of Present Illness:  [name] is a 76 y.o. year old male who is seen for further evaluation of urinary incontinence.  At his visit on 05/20/21, he reported symptoms of urgency with associated incontinence.  He also reported some postvoid incontinence.  His symptoms were present for approximately 1 year.  He reported voiding with a good stream with nocturia x1.  No dysuria or gross hematuria.  No recent UTIs.  No prior medical therapy for his symptoms.  No problems with constipation. PVR = 0 ml. He was given a trial of Gemtesa 75 mg daily.  At his visit in 11/22, he noted improvement in his urgency and incontinence with the medication.  No side effects noted.   IPSS = 7.  He returns today for follow-up.  Portions of the above documentation were copied from a prior visit for review purposes only.  Past Medical History:  Past Medical History:  Diagnosis Date   CAD (coronary artery disease)    Depression    Essential hypertension    GERD (gastroesophageal reflux disease)    Mixed hyperlipidemia    PTSD (post-traumatic stress disorder)     Past Surgical History:  Past Surgical History:  Procedure Laterality Date   ORTHOPEDIC SURGERY      Allergies:  Allergies  Allergen Reactions   Simvastatin Rash    Family History:  Family History  Problem Relation Age of Onset   CVA Father        Cerebral hemorrhage    Social History:  Social History   Tobacco Use   Smoking status: Former    Types: Cigarettes   Smokeless tobacco: Never  Vaping Use   Vaping Use: Never used  Substance Use Topics   Alcohol use: Never   Drug use: Never    ROS: Constitutional:  Negative for fever, chills, weight loss CV: Negative for chest pain, previous MI, hypertension Respiratory:   Negative for shortness of breath, wheezing, sleep apnea, frequent cough GI:  Negative for nausea, vomiting, bloody stool, GERD  Physical exam: There were no vitals taken for this visit. ***  Results: U/A:

## 2021-09-17 ENCOUNTER — Ambulatory Visit: Payer: No Typology Code available for payment source | Admitting: Urology

## 2021-09-17 DIAGNOSIS — I10 Essential (primary) hypertension: Secondary | ICD-10-CM | POA: Diagnosis not present

## 2021-09-17 DIAGNOSIS — E782 Mixed hyperlipidemia: Secondary | ICD-10-CM | POA: Diagnosis not present

## 2021-09-27 DIAGNOSIS — M65342 Trigger finger, left ring finger: Secondary | ICD-10-CM | POA: Insufficient documentation

## 2021-09-27 DIAGNOSIS — M79642 Pain in left hand: Secondary | ICD-10-CM | POA: Diagnosis not present

## 2021-09-27 DIAGNOSIS — M65352 Trigger finger, left little finger: Secondary | ICD-10-CM | POA: Diagnosis not present

## 2021-09-27 DIAGNOSIS — M79641 Pain in right hand: Secondary | ICD-10-CM | POA: Diagnosis not present

## 2021-10-28 DIAGNOSIS — M79641 Pain in right hand: Secondary | ICD-10-CM | POA: Diagnosis not present

## 2021-10-28 DIAGNOSIS — M65342 Trigger finger, left ring finger: Secondary | ICD-10-CM | POA: Diagnosis not present

## 2021-10-28 DIAGNOSIS — M79642 Pain in left hand: Secondary | ICD-10-CM | POA: Diagnosis not present

## 2021-10-28 DIAGNOSIS — M65352 Trigger finger, left little finger: Secondary | ICD-10-CM | POA: Diagnosis not present

## 2021-11-14 ENCOUNTER — Emergency Department (HOSPITAL_COMMUNITY): Payer: No Typology Code available for payment source

## 2021-11-14 ENCOUNTER — Emergency Department (HOSPITAL_COMMUNITY)
Admission: EM | Admit: 2021-11-14 | Discharge: 2021-11-14 | Disposition: A | Discharge status: Home / Self Care | Payer: No Typology Code available for payment source | Attending: Emergency Medicine | Admitting: Emergency Medicine

## 2021-11-14 ENCOUNTER — Other Ambulatory Visit: Payer: Self-pay

## 2021-11-14 DIAGNOSIS — R1011 Right upper quadrant pain: Secondary | ICD-10-CM | POA: Insufficient documentation

## 2021-11-14 DIAGNOSIS — Z7984 Long term (current) use of oral hypoglycemic drugs: Secondary | ICD-10-CM | POA: Diagnosis not present

## 2021-11-14 DIAGNOSIS — R110 Nausea: Secondary | ICD-10-CM | POA: Insufficient documentation

## 2021-11-14 DIAGNOSIS — R1010 Upper abdominal pain, unspecified: Secondary | ICD-10-CM

## 2021-11-14 DIAGNOSIS — Z7982 Long term (current) use of aspirin: Secondary | ICD-10-CM | POA: Insufficient documentation

## 2021-11-14 LAB — COMPREHENSIVE METABOLIC PANEL
ALT: 20 U/L (ref 0–44)
AST: 18 U/L (ref 15–41)
Albumin: 4.5 g/dL (ref 3.5–5.0)
Alkaline Phosphatase: 85 U/L (ref 38–126)
Anion gap: 8 (ref 5–15)
BUN: 10 mg/dL (ref 8–23)
CO2: 26 mmol/L (ref 22–32)
Calcium: 9.2 mg/dL (ref 8.9–10.3)
Chloride: 105 mmol/L (ref 98–111)
Creatinine, Ser: 1 mg/dL (ref 0.61–1.24)
GFR, Estimated: >60 mL/min (ref >60)
Glucose, Bld: 151 mg/dL — ABNORMAL HIGH (ref 70–99)
Potassium: 4 mmol/L (ref 3.5–5.1)
Sodium: 139 mmol/L (ref 135–145)
Total Bilirubin: 0.6 mg/dL (ref 0.3–1.2)
Total Protein: 7.6 g/dL (ref 6.5–8.1)

## 2021-11-14 LAB — CBC WITH DIFFERENTIAL/PLATELET
Abs Immature Granulocytes: 0.02 10*3/uL (ref 0.00–0.07)
Basophils Absolute: 0 10*3/uL (ref 0.0–0.1)
Basophils Relative: 0 %
Eosinophils Absolute: 0 10*3/uL (ref 0.0–0.5)
Eosinophils Relative: 1 %
HCT: 48.1 % (ref 39.0–52.0)
Hemoglobin: 15.6 g/dL (ref 13.0–17.0)
Immature Granulocytes: 0 %
Lymphocytes Relative: 17 %
Lymphs Abs: 1 10*3/uL (ref 0.7–4.0)
MCH: 29.2 pg (ref 26.0–34.0)
MCHC: 32.4 g/dL (ref 30.0–36.0)
MCV: 90.1 fL (ref 80.0–100.0)
Monocytes Absolute: 0.4 10*3/uL (ref 0.1–1.0)
Monocytes Relative: 7 %
Neutro Abs: 4.4 10*3/uL (ref 1.7–7.7)
Neutrophils Relative %: 75 %
Platelets: 227 10*3/uL (ref 150–400)
RBC: 5.34 MIL/uL (ref 4.22–5.81)
RDW: 12.7 % (ref 11.5–15.5)
WBC: 5.9 10*3/uL (ref 4.0–10.5)
nRBC: 0 % (ref 0.0–0.2)

## 2021-11-14 LAB — URINALYSIS, ROUTINE W REFLEX MICROSCOPIC
Bilirubin Urine: NEGATIVE
Glucose, UA: NEGATIVE mg/dL
Hgb urine dipstick: NEGATIVE
Ketones, ur: NEGATIVE mg/dL
Leukocytes,Ua: NEGATIVE
Nitrite: NEGATIVE
Protein, ur: NEGATIVE mg/dL
Specific Gravity, Urine: 1.019 (ref 1.005–1.030)
pH: 5 (ref 5.0–8.0)

## 2021-11-14 LAB — LIPASE, BLOOD: Lipase: 27 U/L (ref 11–51)

## 2021-11-14 MED ORDER — ONDANSETRON 4 MG PO TBDP
ORAL_TABLET | ORAL | 0 refills | Status: DC
Start: 2021-11-14 — End: 2022-07-18

## 2021-11-14 MED ORDER — PANTOPRAZOLE SODIUM 20 MG PO TBEC: 20.0000 mg | DELAYED_RELEASE_TABLET | Freq: Every day | ORAL | 0 refills | Status: DC

## 2021-11-14 MED ORDER — IOHEXOL 300 MG/ML  SOLN
100.0000 mL | Freq: Once | INTRAMUSCULAR | Status: AC | PRN
  Administered 2021-11-14: 100 mL via INTRAVENOUS

## 2021-11-14 MED ORDER — SODIUM CHLORIDE 0.9 % IV BOLUS
1000.0000 mL | Freq: Once | INTRAVENOUS | Status: AC
  Administered 2021-11-14: 1000 mL via INTRAVENOUS

## 2021-11-14 NOTE — Discharge Instructions (Signed)
Follow-up with your family doctor next week for recheck.  Return if problem ?

## 2021-11-14 NOTE — ED Provider Notes (Addendum)
?[facility] ?Provider Note ? ? ?CSN: [id number] ?Arrival date & time: 11/14/21  1018 ? ?  ? ?History ? ?Chief Complaint  ?Patient presents with  ? Abdominal Pain  ? ? ?[name] is a 76 y.o. male. ? ?Patient with a history of diverticulitis.  He comes in with upper abdominal discomfort and nausea ? ?The history is provided by the patient and medical records. No language interpreter was used.  ?Abdominal Pain ?Pain location:  RUQ ?Pain quality: aching   ?Pain radiates to:  Does not radiate ?Pain severity:  Moderate ?Onset quality:  Sudden ?Timing:  Constant ?Progression:  Worsening ?Chronicity:  New ?Context: not alcohol use   ?Relieved by:  Nothing ?Associated symptoms: no chest pain, no cough, no diarrhea, no fatigue and no hematuria   ? ?  ? ?Home Medications ?Prior to Admission medications   ?Medication Sig Start Date End Date Taking? Authorizing Provider  ?amLODipine (NORVASC) 5 MG tablet TAKE ONE TABLET BY MOUTH ONCE DAILY. ?Patient taking differently: Take 5 mg by mouth at bedtime. 09/04/20  Yes Satira Sark, MD  ?aspirin EC 81 MG tablet Take 1 tablet (81 mg total) by mouth daily. ?Patient taking differently: Take 81 mg by mouth every morning. 08/16/19  Yes Satira Sark, MD  ?metFORMIN (GLUCOPHAGE) 500 MG tablet Take 500 mg by mouth 2 (two) times daily. 01/14/21  Yes [provider]  ?omeprazole (PRILOSEC) 20 MG capsule Take 1 capsule by mouth daily. 10/04/20  Yes [provider]  ?ondansetron (ZOFRAN-ODT) 4 MG disintegrating tablet '4mg'$  ODT q4 hours prn nausea/vomit 11/14/21  Yes Milton Ferguson, MD  ?pantoprazole (PROTONIX) 20 MG tablet Take 1 tablet (20 mg total) by mouth daily. 11/14/21  Yes Milton Ferguson, MD  ?rosuvastatin (CRESTOR) 10 MG tablet TAKE ONE TABLET BY MOUTH ONCE DAILY. 12/21/20  Yes Satira Sark, MD  ?sertraline (ZOLOFT) 100 MG tablet Take 1 tablet by mouth daily. 09/05/21  Yes [provider]  ?HYDROcodone-acetaminophen  (NORCO/VICODIN) 5-325 MG tablet One tablet every four hours for pain. ?Patient not taking: Reported on 11/14/2021 01/29/21   Sanjuana Kava, MD  ?metoprolol succinate (TOPROL XL) 25 MG 24 hr tablet Take 1 tablet (25 mg total) by mouth daily. ?Patient not taking: Reported on 11/14/2021 08/16/19   Satira Sark, MD  ?promethazine-dextromethorphan (PROMETHAZINE-DM) 6.25-15 MG/5ML syrup Take 5 mLs by mouth 3 (three) times daily as needed for cough. ?Patient not taking: Reported on 11/14/2021 12/03/20   Scot Jun, FNP  ?traMADol (ULTRAM) 50 MG tablet Take 1 tablet (50 mg total) by mouth every 6 (six) hours as needed. ?Patient not taking: Reported on 11/14/2021 01/24/21   Emeline Darling, PA-C  ?Vibegron (GEMTESA) 75 MG TABS Take 75 mg by mouth daily. ?Patient not taking: Reported on 11/14/2021 06/17/21   Stoneking, Reece Leader., MD  ?   ? ?Allergies    ?Simvastatin   ? ?Review of Systems   ?Review of Systems  ?Constitutional:  Negative for appetite change and fatigue.  ?HENT:  Negative for congestion, ear discharge and sinus pressure.   ?Eyes:  Negative for discharge.  ?Respiratory:  Negative for cough.   ?Cardiovascular:  Negative for chest pain.  ?Gastrointestinal:  Positive for abdominal pain. Negative for diarrhea.  ?Genitourinary:  Negative for frequency and hematuria.  ?Musculoskeletal:  Negative for back pain.  ?Skin:  Negative for rash.  ?Neurological:  Negative for seizures and headaches.  ?Psychiatric/Behavioral:  Negative for hallucinations.   ? ?Physical Exam ?Updated  Vital Signs ?BP (!) 187/75   Pulse (!) 52   Temp 98.1 ?F (36.7 ?C) (Oral)   Resp 18   Ht '5\' 5"'$  (1.651 m)   Wt 90.7 kg   SpO2 100%   BMI 33.28 kg/m?  ?Physical Exam ?Vitals and nursing note reviewed.  ?Constitutional:   ?   Appearance: He is well-developed.  ?HENT:  ?   Head: Normocephalic.  ?   Nose: Nose normal.  ?Eyes:  ?   General: No scleral icterus. ?   Conjunctiva/sclera: Conjunctivae normal.  ?Neck:  ?   Thyroid: No  thyromegaly.  ?Cardiovascular:  ?   Rate and Rhythm: Normal rate and regular rhythm.  ?   Heart sounds: No murmur heard. ?  No friction rub. No gallop.  ?Pulmonary:  ?   Breath sounds: No stridor. No wheezing or rales.  ?Chest:  ?   Chest wall: No tenderness.  ?Abdominal:  ?   General: There is no distension.  ?   Tenderness: There is abdominal tenderness. There is no rebound.  ?Musculoskeletal:     ?   General: Normal range of motion.  ?   Cervical back: Neck supple.  ?Lymphadenopathy:  ?   Cervical: No cervical adenopathy.  ?Skin: ?   Findings: No erythema or rash.  ?Neurological:  ?   Mental Status: He is alert and oriented to person, place, and time.  ?   Motor: No abnormal muscle tone.  ?   Coordination: Coordination normal.  ?Psychiatric:     ?   Behavior: Behavior normal.  ? ? ?ED Results / Procedures / Treatments   ?Labs ?(all labs ordered are listed, but only abnormal results are displayed) ?Labs Reviewed  ?COMPREHENSIVE METABOLIC PANEL - Abnormal; Notable for the following components:  ?    Result Value  ? Glucose, Bld 151 (*)   ? All other components within normal limits  ?URINE CULTURE  ?CBC WITH DIFFERENTIAL/PLATELET  ?LIPASE, BLOOD  ?URINALYSIS, ROUTINE W REFLEX MICROSCOPIC  ? ? ?EKG ?None ? ?Radiology ?CT ABDOMEN PELVIS W CONTRAST ? ?Result Date: 11/14/2021 ?CLINICAL DATA:  Abdominal pain and diarrhea for 1 day. EXAM: CT ABDOMEN AND PELVIS WITH CONTRAST TECHNIQUE: Multidetector CT imaging of the abdomen and pelvis was performed using the standard protocol following bolus administration of intravenous contrast. RADIATION DOSE REDUCTION: This exam was performed according to the departmental dose-optimization program which includes automated exposure control, adjustment of the mA and/or kV according to patient size and/or use of iterative reconstruction technique. CONTRAST:  147m OMNIPAQUE IOHEXOL 300 MG/ML  SOLN COMPARISON:  CT scan 09/18/2007 FINDINGS: Lower chest: The lung bases are clear of an acute  process. No pulmonary lesions or pleural effusions. The heart is within normal limits in size. No pericardial effusion. Aortic and coronary artery calcifications are noted. Hepatobiliary: No hepatic lesions or intrahepatic biliary dilatation. The gallbladder is unremarkable. No common bile duct dilatation. Pancreas: No mass, inflammation or ductal dilatation. Moderate diffuse pancreatic atrophy. Spleen: Normal size.  No focal lesions. Adrenals/Urinary Tract: Adrenal glands are normal. No renal lesions or hydronephrosis. There is a 7 mm midpole right renal calculus. No obstructing ureteral calculi or bladder calculi. Stomach/Bowel: The stomach, duodenum, small bowel and colon are grossly normal without oral contrast. No acute inflammatory process, mass lesions or obstructive findings. The terminal ileum and appendix are normal. There is diffuse and fairly marked colonic diverticulosis but no findings to suggest acute diverticulitis. Vascular/Lymphatic: Moderate to advanced atherosclerotic calcifications involving the aorta, iliac arteries  and branch vessels. No aneurysm or dissection. The major venous structures are patent. No mesenteric or retroperitoneal mass or adenopathy. Reproductive: The prostate gland and seminal vesicles are unremarkable. Other: Small periumbilical abdominal wall hernia containing fat. Musculoskeletal: No significant bony findings. IMPRESSION: 1. No acute abdominal/pelvic findings, mass lesions or adenopathy. 2. 7 mm midpole right renal calculus but no obstructing ureteral calculi or bladder calculi. 3. Diffuse and fairly marked colonic diverticulosis but no findings to suggest acute diverticulitis. Aortic Atherosclerosis (ICD10-I70.0). Electronically Signed   By: Marijo Sanes M.D.   On: 11/14/2021 14:20   ? ?Procedures ?Procedures  ? ? ?Medications Ordered in ED ?Medications  ?sodium chloride 0.9 % bolus 1,000 mL (0 mLs Intravenous Stopped 11/14/21 1342)  ?iohexol (OMNIPAQUE) 300 MG/ML  solution 100 mL (100 mLs Intravenous Contrast Given 11/14/21 1356)  ? ? ?ED Course/ Medical Decision Making/ A&P ?  ?                        ?Medical Decision Making ?Amount and/or Complexity of Data Reviewed ?Radiology: ordered. ? ?

## 2021-11-14 NOTE — ED Triage Notes (Signed)
Patient states right upper abdominal pain with radiation to right flank and diarrhea with nausea since last night. Patient also complains from chronic right hip pain. Ambulatory in triage.  ?

## 2021-11-14 NOTE — ED Notes (Signed)
Pt notified about urine specimen, pt was given a urinal   ?

## 2021-11-16 LAB — URINE CULTURE: Culture: NO GROWTH

## 2021-11-22 DIAGNOSIS — E782 Mixed hyperlipidemia: Secondary | ICD-10-CM | POA: Diagnosis not present

## 2021-11-22 DIAGNOSIS — E039 Hypothyroidism, unspecified: Secondary | ICD-10-CM | POA: Diagnosis not present

## 2021-11-22 DIAGNOSIS — E1169 Type 2 diabetes mellitus with other specified complication: Secondary | ICD-10-CM | POA: Diagnosis not present

## 2021-11-27 DIAGNOSIS — I10 Essential (primary) hypertension: Secondary | ICD-10-CM | POA: Diagnosis not present

## 2021-11-27 DIAGNOSIS — M25551 Pain in right hip: Secondary | ICD-10-CM | POA: Diagnosis not present

## 2021-11-27 DIAGNOSIS — Z634 Disappearance and death of family member: Secondary | ICD-10-CM | POA: Diagnosis not present

## 2021-11-27 DIAGNOSIS — D649 Anemia, unspecified: Secondary | ICD-10-CM | POA: Diagnosis not present

## 2021-11-27 DIAGNOSIS — E1169 Type 2 diabetes mellitus with other specified complication: Secondary | ICD-10-CM | POA: Diagnosis not present

## 2021-11-27 DIAGNOSIS — K219 Gastro-esophageal reflux disease without esophagitis: Secondary | ICD-10-CM | POA: Diagnosis not present

## 2021-11-27 DIAGNOSIS — E782 Mixed hyperlipidemia: Secondary | ICD-10-CM | POA: Diagnosis not present

## 2021-11-27 DIAGNOSIS — E039 Hypothyroidism, unspecified: Secondary | ICD-10-CM | POA: Diagnosis not present

## 2021-11-27 DIAGNOSIS — E559 Vitamin D deficiency, unspecified: Secondary | ICD-10-CM | POA: Diagnosis not present

## 2021-12-10 DIAGNOSIS — M19172 Post-traumatic osteoarthritis, left ankle and foot: Secondary | ICD-10-CM | POA: Diagnosis not present

## 2021-12-18 DIAGNOSIS — M19172 Post-traumatic osteoarthritis, left ankle and foot: Secondary | ICD-10-CM | POA: Diagnosis not present

## 2021-12-18 DIAGNOSIS — M19272 Secondary osteoarthritis, left ankle and foot: Secondary | ICD-10-CM | POA: Diagnosis not present

## 2021-12-18 DIAGNOSIS — M21962 Unspecified acquired deformity of left lower leg: Secondary | ICD-10-CM | POA: Diagnosis not present

## 2022-03-18 ENCOUNTER — Ambulatory Visit (INDEPENDENT_AMBULATORY_CARE_PROVIDER_SITE_OTHER): Payer: Medicare Other | Admitting: Orthopaedic Surgery

## 2022-03-18 ENCOUNTER — Ambulatory Visit (INDEPENDENT_AMBULATORY_CARE_PROVIDER_SITE_OTHER): Payer: Medicare Other

## 2022-03-18 ENCOUNTER — Telehealth: Payer: Self-pay | Admitting: Radiology

## 2022-03-18 ENCOUNTER — Other Ambulatory Visit: Payer: Self-pay | Admitting: Orthopaedic Surgery

## 2022-03-18 ENCOUNTER — Encounter: Payer: Self-pay | Admitting: Orthopaedic Surgery

## 2022-03-18 VITALS — BP 165/92 | HR 83 | Ht 65.0 in | Wt 200.0 lb

## 2022-03-18 DIAGNOSIS — G8929 Other chronic pain: Secondary | ICD-10-CM

## 2022-03-18 DIAGNOSIS — M25562 Pain in left knee: Secondary | ICD-10-CM

## 2022-03-18 DIAGNOSIS — Z96652 Presence of left artificial knee joint: Secondary | ICD-10-CM

## 2022-03-18 NOTE — Telephone Encounter (Signed)
Patient called, said his Rx did not get sent in to [facility] this AM, can you please send in? Thanks.

## 2022-03-18 NOTE — Progress Notes (Signed)
My left knee hurts.  He complains of left knee pain.  He has had a total knee done on the left x 2, the last one by Dr. Maureen Ralphs years ago.  He has had knee pain for about two to three weeks getting gradually worse.  He says he cannot not walk down his driveway to the mailbox without pain and having to stop because of the pain.  He has no redness, no swelling, no fever, no chills.  He has no trauma.  He has no untoward events.  It just started having some mild pain to much more pain with activity.    He has a well healed wound of left knee.  ROM of the knee is full, stable, good gait with a very slight limp left.  NV intact.  There is no redness or effusion.    X-rays were done of the left knee, reported separately. No loosening or fracture apparent.  Encounter Diagnosis  Name Primary?   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  I will make appointment to see Dr. Maureen Ralphs.  Return in one week.  Call if any problem.  Precautions discussed.  Electronically Signed Sanjuana Kava, MD 8/29/202311:14 AM

## 2022-03-19 LAB — CBC WITH DIFFERENTIAL/PLATELET
Absolute Monocytes: 329 cells/uL (ref 200–950)
Basophils Absolute: 28 cells/uL (ref 0–200)
Basophils Relative: 0.6 %
Eosinophils Absolute: 150 cells/uL (ref 15–500)
Eosinophils Relative: 3.2 %
HCT: 42 % (ref 38.5–50.0)
Hemoglobin: 14.1 g/dL (ref 13.2–17.1)
Lymphs Abs: 1419 cells/uL (ref 850–3900)
MCH: 29.9 pg (ref 27.0–33.0)
MCHC: 33.6 g/dL (ref 32.0–36.0)
MCV: 89.2 fL (ref 80.0–100.0)
MPV: 12.1 fL (ref 7.5–12.5)
Monocytes Relative: 7 %
Neutro Abs: 2773 cells/uL (ref 1500–7800)
Neutrophils Relative %: 59 %
Platelets: 205 10*3/uL (ref 140–400)
RBC: 4.71 10*6/uL (ref 4.20–5.80)
RDW: 12 % (ref 11.0–15.0)
Total Lymphocyte: 30.2 %
WBC: 4.7 10*3/uL (ref 3.8–10.8)

## 2022-03-19 LAB — BASIC METABOLIC PANEL
BUN: 17 mg/dL (ref 7–25)
CO2: 25 mmol/L (ref 20–32)
Calcium: 9.6 mg/dL (ref 8.6–10.3)
Chloride: 104 mmol/L (ref 98–110)
Creat: 0.97 mg/dL (ref 0.70–1.28)
Glucose, Bld: 133 mg/dL — ABNORMAL HIGH (ref 65–99)
Potassium: 4.8 mmol/L (ref 3.5–5.3)
Sodium: 140 mmol/L (ref 135–146)

## 2022-03-19 LAB — SEDIMENTATION RATE: Sed Rate: 9 mm/h (ref 0–20)

## 2022-03-19 LAB — C-REACTIVE PROTEIN: CRP: 2.5 mg/L (ref <8.0)

## 2022-03-19 MED ORDER — HYDROCODONE-ACETAMINOPHEN 5-325 MG PO TABS: ORAL_TABLET | ORAL | 0 refills | Status: DC

## 2022-03-19 NOTE — Addendum Note (Signed)
Addended by: Willette Pa on: 03/19/2022 12:24 PM   Modules accepted: Orders

## 2022-03-25 ENCOUNTER — Encounter: Payer: Self-pay | Admitting: Orthopaedic Surgery

## 2022-03-25 ENCOUNTER — Ambulatory Visit (INDEPENDENT_AMBULATORY_CARE_PROVIDER_SITE_OTHER): Payer: Medicare Other | Admitting: Orthopaedic Surgery

## 2022-03-25 VITALS — BP 190/94 | HR 63 | Ht 65.0 in | Wt 200.0 lb

## 2022-03-25 DIAGNOSIS — G8929 Other chronic pain: Secondary | ICD-10-CM | POA: Diagnosis not present

## 2022-03-25 DIAGNOSIS — Z96652 Presence of left artificial knee joint: Secondary | ICD-10-CM | POA: Diagnosis not present

## 2022-03-25 DIAGNOSIS — M25562 Pain in left knee: Secondary | ICD-10-CM

## 2022-03-25 NOTE — Progress Notes (Signed)
My knee still hurts.  He has pain of the left total knee.    All blood work was normal except elevated glucose.  He has no redness and no swelling.  He has no trauma.  ROM of the left knee is very good.  He walks without a cane.  NV intact.  Encounter Diagnosis  Name Primary?   Chronic knee pain after total replacement of left knee joint Yes   I will have him see Dr. Wynelle Link who did his surgery.  Call if any problem.  Precautions discussed.  Electronically Signed Sanjuana Kava, MD 9/5/202310:49 AM

## 2022-03-25 NOTE — Patient Instructions (Signed)
Please follow up with Dr. Wynelle Link for knee pain after a total knee replacement  [facility] [id number] [place], [place] [address] [phone]

## 2022-04-04 DIAGNOSIS — Z96652 Presence of left artificial knee joint: Secondary | ICD-10-CM | POA: Diagnosis not present

## 2022-04-18 ENCOUNTER — Encounter (HOSPITAL_COMMUNITY): Payer: Self-pay

## 2022-04-18 ENCOUNTER — Emergency Department (HOSPITAL_COMMUNITY): Payer: No Typology Code available for payment source

## 2022-04-18 ENCOUNTER — Other Ambulatory Visit: Payer: Self-pay

## 2022-04-18 ENCOUNTER — Emergency Department (HOSPITAL_COMMUNITY)
Admission: EM | Admit: 2022-04-18 | Discharge: 2022-04-18 | Disposition: A | Discharge status: Home / Self Care | Payer: No Typology Code available for payment source | Attending: Student | Admitting: Student

## 2022-04-18 DIAGNOSIS — R404 Transient alteration of awareness: Secondary | ICD-10-CM | POA: Diagnosis not present

## 2022-04-18 DIAGNOSIS — Z87891 Personal history of nicotine dependence: Secondary | ICD-10-CM | POA: Diagnosis not present

## 2022-04-18 DIAGNOSIS — Z7982 Long term (current) use of aspirin: Secondary | ICD-10-CM | POA: Diagnosis not present

## 2022-04-18 DIAGNOSIS — N390 Urinary tract infection, site not specified: Secondary | ICD-10-CM | POA: Insufficient documentation

## 2022-04-18 DIAGNOSIS — I2510 Atherosclerotic heart disease of native coronary artery without angina pectoris: Secondary | ICD-10-CM | POA: Insufficient documentation

## 2022-04-18 DIAGNOSIS — I10 Essential (primary) hypertension: Secondary | ICD-10-CM | POA: Diagnosis not present

## 2022-04-18 DIAGNOSIS — R531 Weakness: Secondary | ICD-10-CM | POA: Diagnosis not present

## 2022-04-18 DIAGNOSIS — Z79899 Other long term (current) drug therapy: Secondary | ICD-10-CM | POA: Diagnosis not present

## 2022-04-18 DIAGNOSIS — R410 Disorientation, unspecified: Secondary | ICD-10-CM | POA: Diagnosis present

## 2022-04-18 LAB — CBC WITH DIFFERENTIAL/PLATELET
Abs Immature Granulocytes: 0.01 10*3/uL (ref 0.00–0.07)
Basophils Absolute: 0 10*3/uL (ref 0.0–0.1)
Basophils Relative: 0 %
Eosinophils Absolute: 0.2 10*3/uL (ref 0.0–0.5)
Eosinophils Relative: 2 %
HCT: 46.7 % (ref 39.0–52.0)
Hemoglobin: 15.5 g/dL (ref 13.0–17.0)
Immature Granulocytes: 0 %
Lymphocytes Relative: 15 %
Lymphs Abs: 1.2 10*3/uL (ref 0.7–4.0)
MCH: 29.8 pg (ref 26.0–34.0)
MCHC: 33.2 g/dL (ref 30.0–36.0)
MCV: 89.6 fL (ref 80.0–100.0)
Monocytes Absolute: 0.5 10*3/uL (ref 0.1–1.0)
Monocytes Relative: 7 %
Neutro Abs: 5.9 10*3/uL (ref 1.7–7.7)
Neutrophils Relative %: 76 %
Platelets: 208 10*3/uL (ref 150–400)
RBC: 5.21 MIL/uL (ref 4.22–5.81)
RDW: 12.3 % (ref 11.5–15.5)
WBC: 7.8 10*3/uL (ref 4.0–10.5)
nRBC: 0 % (ref 0.0–0.2)

## 2022-04-18 LAB — RAPID URINE DRUG SCREEN, HOSP PERFORMED
Amphetamines: NOT DETECTED
Barbiturates: NOT DETECTED
Benzodiazepines: NOT DETECTED
Cocaine: NOT DETECTED
Opiates: POSITIVE — AB
Tetrahydrocannabinol: NOT DETECTED

## 2022-04-18 LAB — COMPREHENSIVE METABOLIC PANEL
ALT: 19 U/L (ref 0–44)
AST: 23 U/L (ref 15–41)
Albumin: 4.3 g/dL (ref 3.5–5.0)
Alkaline Phosphatase: 77 U/L (ref 38–126)
Anion gap: 12 (ref 5–15)
BUN: 11 mg/dL (ref 8–23)
CO2: 24 mmol/L (ref 22–32)
Calcium: 9.2 mg/dL (ref 8.9–10.3)
Chloride: 102 mmol/L (ref 98–111)
Creatinine, Ser: 0.95 mg/dL (ref 0.61–1.24)
GFR, Estimated: >60 mL/min (ref >60)
Glucose, Bld: 159 mg/dL — ABNORMAL HIGH (ref 70–99)
Potassium: 4.6 mmol/L (ref 3.5–5.1)
Sodium: 138 mmol/L (ref 135–145)
Total Bilirubin: 0.9 mg/dL (ref 0.3–1.2)
Total Protein: 7.4 g/dL (ref 6.5–8.1)

## 2022-04-18 LAB — URINALYSIS, ROUTINE W REFLEX MICROSCOPIC
Bacteria, UA: NONE SEEN
Bilirubin Urine: NEGATIVE
Glucose, UA: NEGATIVE mg/dL
Hgb urine dipstick: NEGATIVE
Ketones, ur: 20 mg/dL — AB
Nitrite: NEGATIVE
Protein, ur: 30 mg/dL — AB
Specific Gravity, Urine: 1.017 (ref 1.005–1.030)
pH: 7 (ref 5.0–8.0)

## 2022-04-18 LAB — CBG MONITORING, ED: Glucose-Capillary: 143 mg/dL — ABNORMAL HIGH (ref 70–99)

## 2022-04-18 LAB — TROPONIN I (HIGH SENSITIVITY)
Troponin I (High Sensitivity): 7 ng/L (ref <18)
Troponin I (High Sensitivity): 7 ng/L (ref <18)

## 2022-04-18 LAB — BRAIN NATRIURETIC PEPTIDE: B Natriuretic Peptide: 60 pg/mL (ref 0.0–100.0)

## 2022-04-18 LAB — TSH: TSH: 2.97 u[IU]/mL (ref 0.350–4.500)

## 2022-04-18 MED ORDER — CEFADROXIL 500 MG PO CAPS: 500.0000 mg | ORAL_CAPSULE | Freq: Two times a day (BID) | ORAL | 0 refills | Status: DC

## 2022-04-18 MED ORDER — DEXAMETHASONE SODIUM PHOSPHATE 10 MG/ML IJ SOLN
10.0000 mg | Freq: Once | INTRAMUSCULAR | Status: AC
  Administered 2022-04-18: 10 mg via INTRAVENOUS
  Filled 2022-04-18: qty 1

## 2022-04-18 MED ORDER — SODIUM CHLORIDE 0.9 % IV SOLN
1.0000 g | Freq: Once | INTRAVENOUS | Status: AC
  Administered 2022-04-18: 1 g via INTRAVENOUS
  Filled 2022-04-18: qty 10

## 2022-04-18 MED ORDER — IOHEXOL 300 MG/ML  SOLN
100.0000 mL | Freq: Once | INTRAMUSCULAR | Status: AC | PRN
  Administered 2022-04-18: 100 mL via INTRAVENOUS

## 2022-04-18 NOTE — ED Notes (Signed)
Code STROKE called by EMS @ [phone]. CT notified and beeped out @ 0945.

## 2022-04-18 NOTE — ED Triage Notes (Signed)
Pt brought in by [facility] for c/o weakness; pt states he woke up this am around 6 with a headache and states he was unable to put his pants on; pt was able to walk to neighbors house before ems arrived. Neighbor called 59   Pt was c/o headache behind right eye and pt was unable to grip on left side and was unable to lift left leg and pt would not follow commands and had slurred speech  Cbg 154 Pt was 067'P systolic with ems  Pt states he felt pressure in his chest on Wednesday, but has no complaints of that now

## 2022-04-18 NOTE — Consult Note (Signed)
0943-Code Stroke called 0945-Dr Quinn Axe paged [phone]-Dr Quinn Axe to camera 0958-Code stroke cancelled after discussion between Dr Quinn Axe and Dr Linus Salmons.

## 2022-04-18 NOTE — ED Notes (Signed)
Patient ambulated to  bathroom with one person assist. No problems noted

## 2022-04-18 NOTE — ED Provider Notes (Signed)
[facility] EMERGENCY DEPARTMENT Provider Note  CSN: [id number] Arrival date & time: 04/18/22 2778  Chief Complaint(s) Weakness  HPI [name] is a 76 y.o. male with PMH CAD, depression, HTN who presents emergency department for evaluation of confusion and weakness.  History is difficult to obtain, but patient arrives as a stroke alert due to the EMS concern for left upper and left lower extremity weakness.  Patient states that he went to bed at 11:30 PM last night and awoke this morning with significant difficulty putting on his pants with generalized weakness.  He also states that earlier in the week he had chest pain that has resolved.  EMS states that he then walked partially clothed to his neighbor's house who called EMS. Patient slow to answer questions and will stop answering midsentence.  On arrival, patient with no obvious neurologic deficits with full spontaneous range of motion of bilateral extremities.  Patient is endorsing urinary frequency and stating that he has been urinating himself in the middle the night which is abnormal for him.   Past Medical History Past Medical History:  Diagnosis Date   CAD (coronary artery disease)    Depression    Essential hypertension    GERD (gastroesophageal reflux disease)    Mixed hyperlipidemia    PTSD (post-traumatic stress disorder)    Patient Active Problem List   Diagnosis Date Noted   Urge incontinence of urine 05/20/2021   Coronary artery disease involving native coronary artery of native heart with angina pectoris ([place]) 09/11/2020   Mixed hyperlipidemia 09/11/2020   Essential hypertension 09/11/2020   Tobacco abuse, in remission 09/11/2020   Acid reflux    Home Medication(s) Prior to Admission medications   Medication Sig Start Date End Date Taking? Authorizing Provider  cefadroxil (DURICEF) 500 MG capsule Take 1 capsule (500 mg total) by mouth 2 (two) times daily for 7 days. 04/18/22 04/25/22 Yes Daran Favaro, MD   amLODipine (NORVASC) 5 MG tablet TAKE ONE TABLET BY MOUTH ONCE DAILY. Patient taking differently: Take 5 mg by mouth at bedtime. 09/04/20   Satira Sark, MD  aspirin EC 81 MG tablet Take 1 tablet (81 mg total) by mouth daily. Patient taking differently: Take 81 mg by mouth every morning. 08/16/19   Satira Sark, MD  HYDROcodone-acetaminophen (NORCO/VICODIN) 5-325 MG tablet One tablet every four hours for pain. 03/19/22   Sanjuana Kava, MD  metFORMIN (GLUCOPHAGE) 500 MG tablet Take 500 mg by mouth 2 (two) times daily. 01/14/21   [provider]  metoprolol succinate (TOPROL XL) 25 MG 24 hr tablet Take 1 tablet (25 mg total) by mouth daily. 08/16/19   Satira Sark, MD  omeprazole (PRILOSEC) 20 MG capsule Take 1 capsule by mouth daily. 10/04/20   [provider]  ondansetron (ZOFRAN-ODT) 4 MG disintegrating tablet '4mg'$  ODT q4 hours prn nausea/vomit 11/14/21   Milton Ferguson, MD  pantoprazole (PROTONIX) 20 MG tablet Take 1 tablet (20 mg total) by mouth daily. 11/14/21   Milton Ferguson, MD  rosuvastatin (CRESTOR) 10 MG tablet TAKE ONE TABLET BY MOUTH ONCE DAILY. 12/21/20   Satira Sark, MD  sertraline (ZOLOFT) 100 MG tablet Take 1 tablet by mouth daily. 09/05/21   [provider]  Vibegron (GEMTESA) 75 MG TABS Take 75 mg by mouth daily. 06/17/21   Stoneking, Reece Leader., MD  Past Surgical History Past Surgical History:  Procedure Laterality Date   ORTHOPEDIC SURGERY     Family History Family History  Problem Relation Age of Onset   CVA Father        Cerebral hemorrhage    Social History Social History   Tobacco Use   Smoking status: Former    Types: Cigarettes   Smokeless tobacco: Never  Vaping Use   Vaping Use: Never used  Substance Use Topics   Alcohol use: Never   Drug use: Never    Allergies Simvastatin  Review of Systems Review of Systems  Genitourinary:  Positive for urgency.  Neurological:  Positive for weakness.  Psychiatric/Behavioral:  Positive for confusion.     Physical Exam Vital Signs  I have reviewed the triage vital signs BP (!) 144/92 (BP Location: Right Arm)   Pulse 70   Temp 98.1 F (36.7 C) (Oral)   Resp 20   Ht '5\' 5"'$  (1.651 m)   Wt 90.7 kg   SpO2 96%   BMI 33.27 kg/m   Physical Exam Constitutional:      General: He is not in acute distress.    Appearance: Normal appearance.  HENT:     Head: Normocephalic and atraumatic.     Nose: No congestion or rhinorrhea.  Eyes:     General:        Right eye: No discharge.        Left eye: No discharge.     Extraocular Movements: Extraocular movements intact.     Pupils: Pupils are equal, round, and reactive to light.  Cardiovascular:     Rate and Rhythm: Normal rate and regular rhythm.     Heart sounds: No murmur heard. Pulmonary:     Effort: No respiratory distress.     Breath sounds: No wheezing or rales.  Abdominal:     General: There is no distension.     Tenderness: There is no abdominal tenderness.  Musculoskeletal:        General: Normal range of motion.     Cervical back: Normal range of motion.  Skin:    General: Skin is warm and dry.  Neurological:     Mental Status: He is alert. He is disoriented.     ED Results and Treatments Labs (all labs ordered are listed, but only abnormal results are displayed) Labs Reviewed  COMPREHENSIVE METABOLIC PANEL - Abnormal; Notable for the following components:      Result Value   Glucose, Bld 159 (*)    All other components within normal limits  URINALYSIS, ROUTINE W REFLEX MICROSCOPIC - Abnormal; Notable for the following components:   Ketones, ur 20 (*)    Protein, ur 30 (*)    Leukocytes,Ua TRACE (*)    All other components within normal limits  RAPID URINE DRUG SCREEN, HOSP PERFORMED - Abnormal; Notable for the  following components:   Opiates POSITIVE (*)    All other components within normal limits  CBG MONITORING, ED - Abnormal; Notable for the following components:   Glucose-Capillary 143 (*)    All other components within normal limits  URINE CULTURE  CBC WITH DIFFERENTIAL/PLATELET  BRAIN NATRIURETIC PEPTIDE  TSH  TROPONIN I (HIGH SENSITIVITY)  TROPONIN I (HIGH SENSITIVITY)  Radiology MR LUMBAR SPINE WO CONTRAST  Result Date: 04/18/2022 CLINICAL DATA:  Low back pain, cauda equina syndrome suspected. EXAM: MRI LUMBAR SPINE WITHOUT CONTRAST TECHNIQUE: Multiplanar, multisequence MR imaging of the lumbar spine was performed. No intravenous contrast was administered. COMPARISON:  Same-day lumbar spine CT FINDINGS: Segmentation: There is partial lumbarization of the S1 vertebral body. The lowest fully formed disc space is designated L5-S1. Alignment:  There is trace anterolisthesis of L4 on L5 and L5 on S1. Vertebrae: Vertebral body heights are preserved. Background marrow signal is normal. A T1 and T2 hyperintense lesion in the L1 vertebral body is consistent with a benign intraosseous hemangioma. There is no suspicious marrow signal abnormality or marrow edema. Conus medullaris and cauda equina: Conus extends to the L2 level. Conus and cauda equina appear normal. Paraspinal and other soft tissues: Unremarkable. Disc levels: T12-L1: No significant spinal canal or neural foraminal stenosis L1-L2: No significant spinal canal or neural foraminal stenosis L2-L3: Minimal disc bulge and mild facet arthropathy without significant spinal canal or neural foraminal stenosis L3-L4: There is a mild disc bulge with a small superiorly migrated left subarticular zone extrusion and moderate facet arthropathy with ligamentum flavum thickening resulting in mild spinal canal stenosis with narrowing of the  left subarticular zone without evidence of frank nerve root impingement and mild left worse than right neural foraminal stenosis. L4-L5: There is a diffuse disc bulge with a prominent left foraminal extrusion and advanced bilateral facet arthropathy with ligamentum flavum thickening resulting in severe spinal canal stenosis with cauda equina nerve root compression and severe left and mild right neural foraminal stenosis with impingement of the exiting left L4 nerve root. L5-S1: There is a diffuse disc bulge and advanced right worse than left facet arthropathy resulting in bilateral subarticular zone narrowing and mild-to-moderate right and mild left neural foraminal stenosis. IMPRESSION: 1. Disc bulge with a prominent left foraminal extrusion and advanced facet arthropathy at L4-L5 resulting in severe spinal canal stenosis with cauda equina nerve root compression and severe left neural foraminal stenosis with impingement of the exiting L4 nerve root. 2. Disc bulge and advanced facet arthropathy at L5-S1 resulting in bilateral subarticular zone narrowing and mild-to-moderate right and mild left neural foraminal stenosis. 3. Mild spinal canal and left subarticular zone narrowing at L3-L4. Electronically Signed   By: Valetta Mole M.D.   On: 04/18/2022 14:00   CT L-SPINE NO CHARGE  Result Date: 04/18/2022 CLINICAL DATA:  Urinary incontinence. Patient denies back pain or injury. EXAM: CT LUMBAR SPINE WITH CONTRAST TECHNIQUE: Technique: Multiplanar CT images of the lumbar spine were reconstructed from contemporary CT of the Abdomen and Pelvis. RADIATION DOSE REDUCTION: This exam was performed according to the departmental dose-optimization program which includes automated exposure control, adjustment of the mA and/or kV according to patient size and/or use of iterative reconstruction technique. CONTRAST:  No additional. COMPARISON:  CT abdomen pelvis dated November 14, 2021. FINDINGS: Segmentation: Partial lumbarization  of S1. Alignment: Trace anterolisthesis at L4-L5 and L5-S1. Vertebrae: No acute fracture or focal pathologic process. Paraspinal and other soft tissues: Please see separate CT abdomen pelvis report from same day. Disc levels: Moderate disc bulging and facet arthropathy from L3-L4 through L5-S1. Mild L3-L4 and moderate L4-L5 spinal canal stenosis. Severe neuroforaminal stenosis on the left at L4-L5 and on the right at L5-S1. IMPRESSION: 1. No acute osseous abnormality. 2. Lumbar spondylosis as described above. Moderate L4-L5 spinal canal stenosis. Severe neuroforaminal stenosis on the left at L4-L5 and on the right  at L5-S1. Electronically Signed   By: Titus Dubin M.D.   On: 04/18/2022 13:05   CT ABDOMEN PELVIS W CONTRAST  Result Date: 04/18/2022 CLINICAL DATA:  Pyelonephritis. EXAM: CT ABDOMEN AND PELVIS WITH CONTRAST TECHNIQUE: Multidetector CT imaging of the abdomen and pelvis was performed using the standard protocol following bolus administration of intravenous contrast. RADIATION DOSE REDUCTION: This exam was performed according to the departmental dose-optimization program which includes automated exposure control, adjustment of the mA and/or kV according to patient size and/or use of iterative reconstruction technique. CONTRAST:  15m OMNIPAQUE IOHEXOL 300 MG/ML  SOLN COMPARISON:  November 14, 2021. FINDINGS: Lower chest: No acute abnormality. Hepatobiliary: No focal liver abnormality is seen. No gallstones, gallbladder wall thickening, or biliary dilatation. Pancreas: Unremarkable. No pancreatic ductal dilatation or surrounding inflammatory changes. Spleen: Normal in size without focal abnormality. Adrenals/Urinary Tract: Adrenal glands appear normal. Nonobstructive 9 mm calculus is noted in collecting system of right kidney. No hydronephrosis or renal obstruction is noted. Urinary bladder is unremarkable. Stomach/Bowel: Stomach is within normal limits. Appendix appears normal. No evidence of bowel  wall thickening, distention, or inflammatory changes. Vascular/Lymphatic: Aortic atherosclerosis. No enlarged abdominal or pelvic lymph nodes. Reproductive: Prostate is unremarkable. Other: No abdominal wall hernia or abnormality. No abdominopelvic ascites. Musculoskeletal: No acute or significant osseous findings. IMPRESSION: Nonobstructive right renal calculus is noted. No hydronephrosis or renal obstruction is noted. Aortic Atherosclerosis (ICD10-I70.0). Electronically Signed   By: JMarijo ConceptionM.D.   On: 04/18/2022 12:59   DG Chest Portable 1 View  Result Date: 04/18/2022 CLINICAL DATA:  Dyspnea. EXAM: PORTABLE CHEST 1 VIEW COMPARISON:  04/14/2019 FINDINGS: Stable cardiomediastinal contours. Aortic atherosclerosis. Lung volumes are low. No pleural effusion or edema. No airspace opacities. IMPRESSION: Low lung volumes. No acute findings. Aortic Atherosclerosis (ICD10-I70.0). Electronically Signed   By: TKerby MoorsM.D.   On: 04/18/2022 10:39   CT Head Wo Contrast  Result Date: 04/18/2022 CLINICAL DATA:  Delirium, headache EXAM: CT HEAD WITHOUT CONTRAST TECHNIQUE: Contiguous axial images were obtained from the base of the skull through the vertex without intravenous contrast. RADIATION DOSE REDUCTION: This exam was performed according to the departmental dose-optimization program which includes automated exposure control, adjustment of the mA and/or kV according to patient size and/or use of iterative reconstruction technique. COMPARISON:  09/12/2006 FINDINGS: Brain: No evidence of acute infarction, hemorrhage, hydrocephalus, extra-axial collection or mass lesion/mass effect. Vascular: Atherosclerotic and physiologic intracranial calcifications. Skull: Normal. Negative for fracture or focal lesion. Sinuses/Orbits: Hypoplastic frontal sinuses.  No acute finding. Other: None IMPRESSION: Negative for bleed or other acute intracranial process. Electronically Signed   By: DLucrezia EuropeM.D.   On: 04/18/2022  10:30    Pertinent labs & imaging results that were available during my care of the patient were reviewed by me and considered in my medical decision making (see MDM for details).  Medications Ordered in ED Medications  cefTRIAXone (ROCEPHIN) 1 g in sodium chloride 0.9 % 100 mL IVPB (0 g Intravenous Stopped 04/18/22 1310)  iohexol (OMNIPAQUE) 300 MG/ML solution 100 mL (100 mLs Intravenous Contrast Given 04/18/22 1221)  dexamethasone (DECADRON) injection 10 mg (10 mg Intravenous Given 04/18/22 1452)  Procedures Procedures  (including critical care time)  Medical Decision Making / ED Course   This patient presents to the ED for concern of generalized weakness, altered mental status, this involves an extensive number of treatment options, and is a complaint that carries with it a high risk of complications and morbidity.  The differential diagnosis includes CVA, metabolic encephalopathy, toxic encephalopathy, polypharmacy, spinal stenosis, UTI  MDM: Patient seen emergency room for evaluation of generalized weakness and altered mental status with urinary frequency.  Patient initially arrived as a stroke alert but on initial evaluation, patient has no neurologic deficits and is moving all extremities appropriately.  Stroke alert then canceled after discussion with Dr. Quinn Axe with telemetry neurology.  Patient initial presentation appears to be more consistent with encephalopathy and further medical work-up pursued.Initial CT head unremarkable.  Laboratory evaluation with trace leuk esterase, 11-20 white blood cells on his urinalysis, UDS is positive for opiates which the patient states that he did take pain medication this morning.  Chest x-ray unremarkable.  To further evaluate the patient's involuntary urination, CT abdomen pelvis with CT L-spine recon that showed L4-L5  spinal canal stenosis and severe neuroforaminal stenosis on the left at L4-L5 and L5-S1 on the right.  An MRI was obtained to further evaluate the stenosis and involuntary urination with lower extremity weakness reported this morning that shows a disc bulge with left foraminal extrusion at L4-L5 with severe spinal canal stenosis with cauda equina nerve root compression and severe left neuroforaminal stenosis with impingement of the exiting L4 nerve root,   Additional history obtained: -Additional history obtained from *** -External records from outside source obtained and reviewed including: Chart review including previous notes, labs, imaging, consultation notes   Lab Tests: -I ordered, reviewed, and interpreted labs.   The pertinent results include:   Labs Reviewed  COMPREHENSIVE METABOLIC PANEL - Abnormal; Notable for the following components:      Result Value   Glucose, Bld 159 (*)    All other components within normal limits  URINALYSIS, ROUTINE W REFLEX MICROSCOPIC - Abnormal; Notable for the following components:   Ketones, ur 20 (*)    Protein, ur 30 (*)    Leukocytes,Ua TRACE (*)    All other components within normal limits  RAPID URINE DRUG SCREEN, HOSP PERFORMED - Abnormal; Notable for the following components:   Opiates POSITIVE (*)    All other components within normal limits  CBG MONITORING, ED - Abnormal; Notable for the following components:   Glucose-Capillary 143 (*)    All other components within normal limits  URINE CULTURE  CBC WITH DIFFERENTIAL/PLATELET  BRAIN NATRIURETIC PEPTIDE  TSH  TROPONIN I (HIGH SENSITIVITY)  TROPONIN I (HIGH SENSITIVITY)      EKG ***  EKG Interpretation  Date/Time:    Ventricular Rate:    PR Interval:    QRS Duration:   QT Interval:    QTC Calculation:   R Axis:     Text Interpretation:           Imaging Studies ordered: I ordered imaging studies including *** I independently visualized and interpreted imaging. I  agree with the radiologist interpretation   Medicines ordered and prescription drug management: Meds ordered this encounter  Medications   cefTRIAXone (ROCEPHIN) 1 g in sodium chloride 0.9 % 100 mL IVPB    Order Specific Question:   Antibiotic Indication:    Answer:   UTI   iohexol (OMNIPAQUE) 300 MG/ML solution 100 mL   dexamethasone (DECADRON)  injection 10 mg   cefadroxil (DURICEF) 500 MG capsule    Sig: Take 1 capsule (500 mg total) by mouth 2 (two) times daily for 7 days.    Dispense:  14 capsule    Refill:  0    -I have reviewed the patients home medicines and have made adjustments as needed  Critical interventions ***  Consultations Obtained: I requested consultation with the ***,  and discussed lab and imaging findings as well as pertinent plan - they recommend: ***   Cardiac Monitoring: The patient was maintained on a cardiac monitor.  I personally viewed and interpreted the cardiac monitored which showed an underlying rhythm of: ***  Social Determinants of Health:  Factors impacting patients care include: ***   Reevaluation: After the interventions noted above, I reevaluated the patient and found that they have :{resolved/improved/worsened:23923::"improved"}  Co morbidities that complicate the patient evaluation  Past Medical History:  Diagnosis Date   CAD (coronary artery disease)    Depression    Essential hypertension    GERD (gastroesophageal reflux disease)    Mixed hyperlipidemia    PTSD (post-traumatic stress disorder)       Dispostion: I considered admission for this patient, ***     Final Clinical Impression(s) / ED Diagnoses Final diagnoses:  Transient alteration of awareness  Urinary tract infection without hematuria, site unspecified     '@PCDICTATION'$ @

## 2022-04-20 LAB — URINE CULTURE

## 2022-04-21 ENCOUNTER — Emergency Department (HOSPITAL_COMMUNITY): Payer: No Typology Code available for payment source

## 2022-04-21 ENCOUNTER — Other Ambulatory Visit: Payer: Self-pay

## 2022-04-21 ENCOUNTER — Inpatient Hospital Stay (HOSPITAL_COMMUNITY)
Admission: EM | Admit: 2022-04-21 | Discharge: 2022-04-23 | DRG: 064 | Disposition: A | Discharge status: Home / Self Care | Payer: No Typology Code available for payment source | Attending: Internal Medicine | Admitting: Internal Medicine

## 2022-04-21 ENCOUNTER — Encounter (HOSPITAL_COMMUNITY): Payer: Self-pay

## 2022-04-21 DIAGNOSIS — Z888 Allergy status to other drugs, medicaments and biological substances status: Secondary | ICD-10-CM

## 2022-04-21 DIAGNOSIS — I161 Hypertensive emergency: Secondary | ICD-10-CM | POA: Diagnosis not present

## 2022-04-21 DIAGNOSIS — E782 Mixed hyperlipidemia: Secondary | ICD-10-CM | POA: Diagnosis present

## 2022-04-21 DIAGNOSIS — G9341 Metabolic encephalopathy: Secondary | ICD-10-CM | POA: Diagnosis present

## 2022-04-21 DIAGNOSIS — I10 Essential (primary) hypertension: Secondary | ICD-10-CM | POA: Diagnosis present

## 2022-04-21 DIAGNOSIS — R410 Disorientation, unspecified: Principal | ICD-10-CM

## 2022-04-21 DIAGNOSIS — R270 Ataxia, unspecified: Secondary | ICD-10-CM | POA: Diagnosis not present

## 2022-04-21 DIAGNOSIS — R4781 Slurred speech: Secondary | ICD-10-CM | POA: Diagnosis present

## 2022-04-21 DIAGNOSIS — D446 Neoplasm of uncertain behavior of carotid body: Secondary | ICD-10-CM

## 2022-04-21 DIAGNOSIS — Z28310 Unvaccinated for covid-19: Secondary | ICD-10-CM

## 2022-04-21 DIAGNOSIS — Z823 Family history of stroke: Secondary | ICD-10-CM

## 2022-04-21 DIAGNOSIS — Z66 Do not resuscitate: Secondary | ICD-10-CM | POA: Diagnosis present

## 2022-04-21 DIAGNOSIS — I7789 Other specified disorders of arteries and arterioles: Secondary | ICD-10-CM | POA: Diagnosis not present

## 2022-04-21 DIAGNOSIS — F4310 Post-traumatic stress disorder, unspecified: Secondary | ICD-10-CM | POA: Diagnosis present

## 2022-04-21 DIAGNOSIS — Z23 Encounter for immunization: Secondary | ICD-10-CM

## 2022-04-21 DIAGNOSIS — R531 Weakness: Secondary | ICD-10-CM | POA: Diagnosis present

## 2022-04-21 DIAGNOSIS — Z7984 Long term (current) use of oral hypoglycemic drugs: Secondary | ICD-10-CM

## 2022-04-21 DIAGNOSIS — I2510 Atherosclerotic heart disease of native coronary artery without angina pectoris: Secondary | ICD-10-CM | POA: Diagnosis present

## 2022-04-21 DIAGNOSIS — Z7902 Long term (current) use of antithrombotics/antiplatelets: Secondary | ICD-10-CM

## 2022-04-21 DIAGNOSIS — Z602 Problems related to living alone: Secondary | ICD-10-CM | POA: Diagnosis present

## 2022-04-21 DIAGNOSIS — F32A Depression, unspecified: Secondary | ICD-10-CM | POA: Diagnosis present

## 2022-04-21 DIAGNOSIS — Z87891 Personal history of nicotine dependence: Secondary | ICD-10-CM

## 2022-04-21 DIAGNOSIS — I63422 Cerebral infarction due to embolism of left anterior cerebral artery: Secondary | ICD-10-CM | POA: Diagnosis not present

## 2022-04-21 DIAGNOSIS — R441 Visual hallucinations: Secondary | ICD-10-CM | POA: Diagnosis present

## 2022-04-21 DIAGNOSIS — Z7982 Long term (current) use of aspirin: Secondary | ICD-10-CM

## 2022-04-21 DIAGNOSIS — Z8673 Personal history of transient ischemic attack (TIA), and cerebral infarction without residual deficits: Secondary | ICD-10-CM

## 2022-04-21 DIAGNOSIS — Z79899 Other long term (current) drug therapy: Secondary | ICD-10-CM

## 2022-04-21 DIAGNOSIS — K219 Gastro-esophageal reflux disease without esophagitis: Secondary | ICD-10-CM | POA: Diagnosis present

## 2022-04-21 LAB — CBC WITH DIFFERENTIAL/PLATELET
Abs Immature Granulocytes: 0.02 10*3/uL (ref 0.00–0.07)
Basophils Absolute: 0 10*3/uL (ref 0.0–0.1)
Basophils Relative: 1 %
Eosinophils Absolute: 0.1 10*3/uL (ref 0.0–0.5)
Eosinophils Relative: 2 %
HCT: 43.7 % (ref 39.0–52.0)
Hemoglobin: 14.8 g/dL (ref 13.0–17.0)
Immature Granulocytes: 0 %
Lymphocytes Relative: 27 %
Lymphs Abs: 1.7 10*3/uL (ref 0.7–4.0)
MCH: 30.7 pg (ref 26.0–34.0)
MCHC: 33.9 g/dL (ref 30.0–36.0)
MCV: 90.7 fL (ref 80.0–100.0)
Monocytes Absolute: 0.6 10*3/uL (ref 0.1–1.0)
Monocytes Relative: 9 %
Neutro Abs: 3.9 10*3/uL (ref 1.7–7.7)
Neutrophils Relative %: 61 %
Platelets: 242 10*3/uL (ref 150–400)
RBC: 4.82 MIL/uL (ref 4.22–5.81)
RDW: 12.5 % (ref 11.5–15.5)
WBC: 6.3 10*3/uL (ref 4.0–10.5)
nRBC: 0 % (ref 0.0–0.2)

## 2022-04-21 LAB — URINALYSIS, ROUTINE W REFLEX MICROSCOPIC
Bacteria, UA: NONE SEEN
Bilirubin Urine: NEGATIVE
Glucose, UA: NEGATIVE mg/dL
Ketones, ur: NEGATIVE mg/dL
Leukocytes,Ua: NEGATIVE
Nitrite: NEGATIVE
Protein, ur: NEGATIVE mg/dL
RBC / HPF: >50 RBC/hpf — ABNORMAL HIGH (ref 0–5)
Specific Gravity, Urine: 1.024 (ref 1.005–1.030)
pH: 6 (ref 5.0–8.0)

## 2022-04-21 LAB — COMPREHENSIVE METABOLIC PANEL
ALT: 20 U/L (ref 0–44)
AST: 21 U/L (ref 15–41)
Albumin: 4.1 g/dL (ref 3.5–5.0)
Alkaline Phosphatase: 65 U/L (ref 38–126)
Anion gap: 7 (ref 5–15)
BUN: 20 mg/dL (ref 8–23)
CO2: 28 mmol/L (ref 22–32)
Calcium: 9.1 mg/dL (ref 8.9–10.3)
Chloride: 105 mmol/L (ref 98–111)
Creatinine, Ser: 0.94 mg/dL (ref 0.61–1.24)
GFR, Estimated: >60 mL/min (ref >60)
Glucose, Bld: 131 mg/dL — ABNORMAL HIGH (ref 70–99)
Potassium: 3.9 mmol/L (ref 3.5–5.1)
Sodium: 140 mmol/L (ref 135–145)
Total Bilirubin: 0.6 mg/dL (ref 0.3–1.2)
Total Protein: 6.9 g/dL (ref 6.5–8.1)

## 2022-04-21 LAB — TROPONIN I (HIGH SENSITIVITY)
Troponin I (High Sensitivity): 4 ng/L (ref <18)
Troponin I (High Sensitivity): 5 ng/L (ref <18)

## 2022-04-21 MED ORDER — IOHEXOL 350 MG/ML SOLN
75.0000 mL | Freq: Once | INTRAVENOUS | Status: AC | PRN
  Administered 2022-04-21: 75 mL via INTRAVENOUS

## 2022-04-21 NOTE — ED Provider Notes (Signed)
[facility] EMERGENCY DEPARTMENT Provider Note   CSN: [id number] Arrival date & time: 04/21/22  1229     History Chief Complaint  Patient presents with   Altered Mental Status    [name] is a 76 y.o. male with history of CAD, hypertension, GERD, hyperlipidemia presents the emergency department for evaluation of intermittent confusion and headaches.  Patient reports that he has been having confusion and forgetfulness over the past few days.  He also reports an intermittent headache since then as well.  He reports that one of the days he had some right arm numbness that radiated to his jaw however he does not have any chest pain or shortness of any visual changes.  He reports that he had trouble using his phone when today and overall feels generalized weakness.  He thinks he is having hallucinations and is seeing things out of the corners of his eyes.  He reports that he is having to hold onto walls and furniture to walk.  He reports he is compliant with his medications.  Denies any trouble talking.  Denies any falls.   Altered Mental Status Associated symptoms: headaches and weakness   Associated symptoms: no abdominal pain, no fever, no nausea and no vomiting        Home Medications Prior to Admission medications   Medication Sig Start Date End Date Taking? Authorizing Provider  cefadroxil (DURICEF) 500 MG capsule Take 1 capsule (500 mg total) by mouth 2 (two) times daily for 7 days. 04/18/22 04/25/22 Yes Kommor, [place], MD  HYDROcodone-acetaminophen (NORCO/VICODIN) 5-325 MG tablet One tablet every four hours for pain. 03/19/22  Yes Sanjuana Kava, MD  metFORMIN (GLUCOPHAGE) 500 MG tablet Take 500 mg by mouth every evening. 01/14/21  Yes [provider]  rosuvastatin (CRESTOR) 10 MG tablet TAKE ONE TABLET BY MOUTH ONCE DAILY. 12/21/20  Yes Satira Sark, MD  amLODipine (NORVASC) 5 MG tablet TAKE ONE TABLET BY MOUTH ONCE DAILY. Patient not taking: Reported on 04/21/2022  09/04/20   Satira Sark, MD  aspirin EC 81 MG tablet Take 1 tablet (81 mg total) by mouth daily. Patient not taking: Reported on 04/21/2022 08/16/19   Satira Sark, MD  metoprolol succinate (TOPROL XL) 25 MG 24 hr tablet Take 1 tablet (25 mg total) by mouth daily. Patient not taking: Reported on 04/21/2022 08/16/19   Satira Sark, MD  omeprazole (PRILOSEC) 20 MG capsule Take 1 capsule by mouth daily. Patient not taking: Reported on 04/21/2022 10/04/20   [provider]  ondansetron (ZOFRAN-ODT) 4 MG disintegrating tablet '4mg'$  ODT q4 hours prn nausea/vomit Patient not taking: Reported on 04/21/2022 11/14/21   Milton Ferguson, MD  pantoprazole (PROTONIX) 20 MG tablet Take 1 tablet (20 mg total) by mouth daily. Patient not taking: Reported on 04/21/2022 11/14/21   Milton Ferguson, MD  sertraline (ZOLOFT) 100 MG tablet Take 1 tablet by mouth daily. Patient not taking: Reported on 04/21/2022 09/05/21   [provider]  Vibegron (GEMTESA) 75 MG TABS Take 75 mg by mouth daily. Patient not taking: Reported on 04/21/2022 06/17/21   Primus Bravo., MD      Allergies    Simvastatin    Review of Systems   Review of Systems  Constitutional:  Negative for chills and fever.  Respiratory:  Negative for shortness of breath.   Cardiovascular:  Negative for chest pain.  Gastrointestinal:  Negative for abdominal pain, nausea and vomiting.  Genitourinary:  Negative for dysuria and hematuria.  Musculoskeletal:  Negative for back pain and neck pain.  Neurological:  Positive for weakness and headaches. Negative for syncope.    Physical Exam Updated Vital Signs BP (!) 158/79 (BP Location: Right Arm)   Pulse 62   Temp 98.4 F (36.9 C) (Oral)   Resp 18   Ht '5\' 5"'$  (1.651 m)   Wt 90.7 kg   SpO2 98%   BMI 33.28 kg/m  Physical Exam Vitals and nursing note reviewed.  Constitutional:      General: He is not in acute distress.    Appearance: Normal appearance. He is not  ill-appearing or toxic-appearing.  HENT:     Head: Normocephalic and atraumatic.  Eyes:     General: No scleral icterus. Cardiovascular:     Rate and Rhythm: Normal rate and regular rhythm.  Pulmonary:     Effort: Pulmonary effort is normal. No respiratory distress.     Breath sounds: Normal breath sounds.     Comments: Clear to auscultation bilaterally. Abdominal:     General: Bowel sounds are normal.     Palpations: Abdomen is soft.     Tenderness: There is no abdominal tenderness. There is no guarding or rebound.  Musculoskeletal:        General: No deformity.     Cervical back: Normal range of motion. No tenderness.  Skin:    General: Skin is warm and dry.  Neurological:     Mental Status: He is alert and oriented to person, place, and time. Mental status is at baseline.     GCS: GCS eye subscore is 4. GCS verbal subscore is 5. GCS motor subscore is 6.     Cranial Nerves: No cranial nerve deficit or dysarthria.     Comments: Slightly tangential/disoriented speech can sometimes repeat himself.  He is alert and oriented x3 however. Possible slight facial droop on the left.  He reports some sensation deficits to his left upper and lower extremity.  Spares the face.  His strength is intact in his upper and lower extremities however.  No pronator drift.  Normal finger-to-nose.     ED Results / Procedures / Treatments   Labs (all labs ordered are listed, but only abnormal results are displayed) Labs Reviewed  COMPREHENSIVE METABOLIC PANEL - Abnormal; Notable for the following components:      Result Value   Glucose, Bld 131 (*)    All other components within normal limits  URINALYSIS, ROUTINE W REFLEX MICROSCOPIC - Abnormal; Notable for the following components:   Hgb urine dipstick MODERATE (*)    RBC / HPF >50 (*)    Non Squamous Epithelial 0-5 (*)    All other components within normal limits  URINE CULTURE  CBC WITH DIFFERENTIAL/PLATELET  COMPREHENSIVE METABOLIC PANEL   MAGNESIUM  CBC WITH DIFFERENTIAL/PLATELET  TSH  TROPONIN I (HIGH SENSITIVITY)  TROPONIN I (HIGH SENSITIVITY)    EKG None  Radiology MR BRAIN WO CONTRAST  Result Date: 04/21/2022 CLINICAL DATA:  Provided history: Transient ischemic attack (TIA). EXAM: MRI HEAD WITHOUT CONTRAST TECHNIQUE: Multiplanar, multiecho pulse sequences of the brain and surrounding structures were obtained without intravenous contrast. COMPARISON:  Non-contrast head CT and CT angiogram head/neck 04/21/2022. Brain MRI 02/21/2021. FINDINGS: Mild intermittent motion degradation. Brain: Mild generalized cerebral atrophy. Two small adjacent foci of diffusion-weighted signal abnormality within the subcortical white matter of the posterior left frontal lobe (with involvement of the left precentral gyrus). These foci measure up to 4 mm and are compatible with acute infarcts. There are  a few small foci of mild diffusion-weighted signal abnormality within the left greater than right centrum semiovale, likely reflecting subacute infarcts (for instance as seen on series 5, image 25). Small chronic cortical/subcortical infarct within the posterior right frontal lobe, new from the prior brain MRI of 02/21/2021 (series 12, images 27-29). Background mild multifocal T2 FLAIR hyperintense signal abnormality within the cerebral white matter, nonspecific but compatible with chronic small vessel image disease. Punctate foci of T2* signal loss within the bilateral basal ganglia, likely reflecting mineralization. No evidence of an intracranial mass. No extra-axial fluid collection. No midline shift. Vascular: Maintained flow voids within the proximal large arterial vessels. Dominant left vertebral artery. Skull and upper cervical spine: No focal suspicious marrow lesion. Incompletely assessed cervical spondylosis. A C3-C4 disc herniation contributes to at least mild spinal canal stenosis, contacting and mildly flattening the ventral aspect of the  spinal cord (series 9, image 12). Sinuses/Orbits: Bilateral proptosis. Prior bilateral ocular lens replacement. No significant paranasal sinus disease. IMPRESSION: 1. Two small adjacent acute infarcts within the posterior left frontal lobe white matter (measuring up to 4 mm). Notably, these infarcts involve the left precentral gyrus. 2. Few small subacute appearing infarcts within the left greater than right centrum semiovale. 3. Small chronic cortical/subcortical infarct within the posterior right frontal lobe, new from the prior brain MRI of 02/21/2021 (right MCA territory) 4. Background mild chronic small vessel changes within the cerebral white matter. 5. Mild generalized cerebral atrophy. 6. Incompletely assessed cervical spondylosis. A C3-C4 disc herniation contributes to at least mild spinal canal stenosis, contacting and mildly flattening the ventral aspect of the spinal cord. 7. Bilateral proptosis. Electronically Signed   By: Kellie Simmering D.O.   On: 04/21/2022 19:07   CT ANGIO HEAD NECK W WO CM  Result Date: 04/21/2022 CLINICAL DATA:  Headache, disorientation, episode of right hand numbness. EXAM: CT ANGIOGRAPHY HEAD AND NECK TECHNIQUE: Multidetector CT imaging of the head and neck was performed using the standard protocol during bolus administration of intravenous contrast. Multiplanar CT image reconstructions and MIPs were obtained to evaluate the vascular anatomy. Carotid stenosis measurements (when applicable) are obtained utilizing NASCET criteria, using the distal internal carotid diameter as the denominator. RADIATION DOSE REDUCTION: This exam was performed according to the departmental dose-optimization program which includes automated exposure control, adjustment of the mA and/or kV according to patient size and/or use of iterative reconstruction technique. CONTRAST:  1m OMNIPAQUE IOHEXOL 350 MG/ML SOLN COMPARISON:  CT head 04/18/2022 FINDINGS: CT HEAD FINDINGS Brain: There is hypodensity  with loss of gray-white differentiation in the right middle frontal gyrus concerning for subacute infarct, also present on the CT head from 04/18/2022 but increased in conspicuity (5-20, 8-17). There is no other evidence of acute infarct. There is no acute intracranial hemorrhage or extra-axial fluid collection. Parenchymal volume is normal. The ventricles are normal in size. Gray-white differentiation is preserved. There is mild patchy hypodensity in the supratentorial white matter likely reflecting sequela of mild chronic small vessel ischemic change. There is no mass lesion.  There is no mass effect or midline shift. Vascular: See below. Skull: Normal. Negative for fracture or focal lesion. Sinuses/Orbits: Paranasal sinuses are clear. Bilateral lens implants are in place. The globes and orbits are otherwise unremarkable. Other: None. Review of the MIP images confirms the above findings CTA NECK FINDINGS Aortic arch: The origins of the major branch vessels are patent. The aortic arch is otherwise not included within the field of view. Right carotid system: The right  common carotid artery is patent there is mixed plaque of the bifurcation without hemodynamically significant stenosis or occlusion. There is no dissection or aneurysm. Left carotid system: The left common carotid artery is patent. There is bulky mixed plaque in the proximal left internal carotid artery resulting in up to approximately 50-60% stenosis the distal left internal carotid artery is patent. Vertebral arteries: Left vertebral artery is dominant with a diminutive right vertebral artery, a normal variant. The vertebral arteries are patent, without hemodynamically significant stenosis or occlusion. There is no dissection or aneurysm. Skeleton: There is no acute osseous abnormality or suspicious osseous lesion. There is no visible canal hematoma. Other neck: There is an avidly enhancing lesion between the right internal and external carotid  arteries with splaying of the artery is measuring 0.9 cm x 1.1 cm x 1.4 cm the soft tissues of the neck are otherwise unremarkable. Upper chest: There is mosaic attenuation in the lung apices. Review of the MIP images confirms the above findings CTA HEAD FINDINGS Anterior circulation: There is calcified plaque in the intracranial ICAs resulting in up to mild stenosis bilaterally. The bilateral MCAs are patent without proximal stenosis or occlusion. The bilateral ACAs are patent without proximal stenosis or occlusion. The anterior communicating artery is normal. There is no aneurysm or AVM. Posterior circulation: The bilateral V4 segments are patent. The basilar artery is patent. The major cerebellar arteries appear patent. The bilateral PCAs are patent without proximal stenosis or occlusion. Diminutive bilateral posterior communicating arteries are identified. There is no aneurysm or AVM. Venous sinuses: Patent. Anatomic variants: None. Review of the MIP images confirms the above findings IMPRESSION: 1. Suspected evolving subacute infarct in the right middle frontal gyrus, present on the CT head from 04/18/2022 but increased in conspicuity. Consider brain MRI for characterization. 2.  No emergent large vessel occlusion. 3. Mixed plaque at the left carotid bifurcation resulting in approximately 50-60% stenosis of the proximal left internal carotid artery. Mild mixed plaque on the right without hemodynamically significant stenosis. Patent vertebral arteries. 4. 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 5. Mosaic attenuation in the lung apices suggesting air trapping/small airway disease. Electronically Signed   By: Valetta Mole M.D.   On: 04/21/2022 16:46    Procedures Procedures    Medications Ordered in  ED Medications  iohexol (OMNIPAQUE) 350 MG/ML injection 75 mL (75 mLs Intravenous Contrast Given 04/21/22 1608)    ED Course/ Medical Decision Making/ A&P                            Medical Decision Making Amount and/or Complexity of Data Reviewed Labs: ordered. Radiology: ordered.  Risk Prescription drug management. Decision regarding hospitalization.   76 year old male presents the emergency room for evaluation of altered mental status and headaches.  Differential diagnosis UTI, dementia, stroke, TIA, GCA, electrolyte abnormality.  Vital signs show limited blood pressure however patient is afebrile, normal pulse rate, satting well room air without increased work of breathing.  Physical exam as noted above.  Will order labs and imaging.  On evaluation, patient was seen on 04-18-2022 for transient altered numbness as well as headaches/apparently episodes of incontinence although patient does not remember this.  He was discharged on [facility] which patient reports he has been compliant on.  He reports that he has been feeling more weak and is having to hold onto walls and furniture to ambulate around his house.  He is concerned for a fall.  His urine culture from then grew out multiple species and recommended we collect  I independently reviewed and interpreted the patient's labs.  Because of his previous urine culture, I asked that the patient be a note to get a more clear urine culture.  Urinalysis shows moderate amount of hemoglobin with greater than 50 red blood cells, likely from traumatic cath.  No bacteria or white blood cells or leukocytes are seen however.  CMP shows mildly elevated glucose at 131 otherwise no electrolyte or LFT abnormality.  CBC with a leukocytosis or anemia.  Troponin at 5.  CTA of the head and neck show  1. Suspected evolving subacute infarct in the right middle frontal gyrus, present on the CT head from 04/18/2022 but increased in conspicuity. Consider brain MRI  for characterization. 2.  No emergent large vessel occlusion. 3. Mixed plaque at the left carotid bifurcation resulting in approximately 50-60% stenosis of the proximal left internal carotid artery. Mild mixed plaque on the right without hemodynamically significant stenosis. Patent vertebral arteries. 4. 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 5. Mosaic attenuation in the lung apices suggesting air trapping/small airway disease.  I consulted neurology and spoke with Dr. Cheral Marker who discussed that this could be a possible artifact or possible stroke and recommends an MRI without to further characterize.  He reports that the patient's symptoms do not correspond with the area of possible stroke/possible artifact as well.  MRI shows 1. Two small adjacent acute infarcts within the posterior left frontal lobe white matter (measuring up to 4 mm). Notably, these infarcts involve the left precentral gyrus. 2. Few small subacute appearing infarcts within the left greater than right centrum semiovale. 3. Small chronic cortical/subcortical infarct within the posterior right frontal lobe, new from the prior brain MRI of 02/21/2021 (right MCA territory) 4. Background mild chronic small vessel changes within the cerebral white matter. 5. Mild generalized cerebral atrophy. 6. Incompletely assessed cervical spondylosis. A C3-C4 disc herniation contributes to at least mild spinal canal stenosis, contacting and mildly flattening the ventral aspect of the spinal cord. 7. Bilateral proptosis.  I consulted neurology again and had an at length discussion with Dr. Cheral Marker who provided some education.  He said that this is a false positive correspondence with a "classic T2 shine through with artifact".  He reports that this is a false  positive and is likely old infarcts, but there is nothing acute or subacute.  He thinks that he can follow-up outpatient with neurology and there is nothing acute that needs admission.  Given the patient's recent UTI diagnosis and his ataxia, I do not feel that he is safe to go home to live by himself.  He does have family members however they are older than he is and do not feel comfortable taking care of him as well.  I likely think he could benefit from PT and OT and to finish his antibiotics to build up his strength to see if he feels better with going home.  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  Patient would like  to be admitted.  Admitted to hospitalist.  Final Clinical Impression(s) / ED Diagnoses Final diagnoses:  Confusion  Ataxia    Rx / DC Orders ED Discharge Orders     None         Sherrell Puller, PA-C 04/22/22 [id number]    Milton Ferguson, MD 04/24/22 [phone]

## 2022-04-21 NOTE — ED Triage Notes (Signed)
Pt to er, pt states that Wednesday he woke up with a really bad headache, states that when it got light out he went to his neighbors house and called the ambulance, states that he was seen here, states that today he is here because he feels weak and disoriented.  Pt states that he was on the way home from the vet and started having a headache.  Pt states that he is weak all over

## 2022-04-21 NOTE — ED Notes (Signed)
Pt gait steady when walking Pt O2 96-98% HR 100-108  Upon being distracted by family at bedside, pt did stumble but was easily re-balanced  Pt states be feels weak

## 2022-04-21 NOTE — ED Notes (Signed)
Pt reports feeling slightly disoriented since ER visit last week. States yesterday had an episode of right hand numbness that progressed to right jaw, then went away, lasting "just a few minutes"

## 2022-04-21 NOTE — ED Notes (Signed)
Pt resting in bed, no s/s of acute distress. Visitors at bedside, pt cooperative and appropriate

## 2022-04-22 ENCOUNTER — Inpatient Hospital Stay (HOSPITAL_COMMUNITY): Payer: No Typology Code available for payment source

## 2022-04-22 ENCOUNTER — Telehealth: Payer: Self-pay | Admitting: *Deleted

## 2022-04-22 DIAGNOSIS — Z602 Problems related to living alone: Secondary | ICD-10-CM | POA: Diagnosis present

## 2022-04-22 DIAGNOSIS — I10 Essential (primary) hypertension: Secondary | ICD-10-CM | POA: Diagnosis present

## 2022-04-22 DIAGNOSIS — Z7902 Long term (current) use of antithrombotics/antiplatelets: Secondary | ICD-10-CM | POA: Diagnosis not present

## 2022-04-22 DIAGNOSIS — I639 Cerebral infarction, unspecified: Secondary | ICD-10-CM

## 2022-04-22 DIAGNOSIS — Z28310 Unvaccinated for covid-19: Secondary | ICD-10-CM | POA: Diagnosis not present

## 2022-04-22 DIAGNOSIS — Z8673 Personal history of transient ischemic attack (TIA), and cerebral infarction without residual deficits: Secondary | ICD-10-CM | POA: Diagnosis not present

## 2022-04-22 DIAGNOSIS — E782 Mixed hyperlipidemia: Secondary | ICD-10-CM | POA: Diagnosis present

## 2022-04-22 DIAGNOSIS — R4781 Slurred speech: Secondary | ICD-10-CM | POA: Diagnosis present

## 2022-04-22 DIAGNOSIS — I161 Hypertensive emergency: Secondary | ICD-10-CM | POA: Diagnosis not present

## 2022-04-22 DIAGNOSIS — Z823 Family history of stroke: Secondary | ICD-10-CM | POA: Diagnosis not present

## 2022-04-22 DIAGNOSIS — G9341 Metabolic encephalopathy: Secondary | ICD-10-CM

## 2022-04-22 DIAGNOSIS — Z888 Allergy status to other drugs, medicaments and biological substances status: Secondary | ICD-10-CM | POA: Diagnosis not present

## 2022-04-22 DIAGNOSIS — I63422 Cerebral infarction due to embolism of left anterior cerebral artery: Secondary | ICD-10-CM | POA: Diagnosis present

## 2022-04-22 DIAGNOSIS — R270 Ataxia, unspecified: Secondary | ICD-10-CM

## 2022-04-22 DIAGNOSIS — Z7984 Long term (current) use of oral hypoglycemic drugs: Secondary | ICD-10-CM | POA: Diagnosis not present

## 2022-04-22 DIAGNOSIS — Z66 Do not resuscitate: Secondary | ICD-10-CM | POA: Diagnosis present

## 2022-04-22 DIAGNOSIS — F32A Depression, unspecified: Secondary | ICD-10-CM | POA: Diagnosis present

## 2022-04-22 DIAGNOSIS — Z7982 Long term (current) use of aspirin: Secondary | ICD-10-CM | POA: Diagnosis not present

## 2022-04-22 DIAGNOSIS — I2510 Atherosclerotic heart disease of native coronary artery without angina pectoris: Secondary | ICD-10-CM | POA: Diagnosis present

## 2022-04-22 DIAGNOSIS — I7789 Other specified disorders of arteries and arterioles: Secondary | ICD-10-CM | POA: Diagnosis not present

## 2022-04-22 DIAGNOSIS — Z87891 Personal history of nicotine dependence: Secondary | ICD-10-CM | POA: Diagnosis not present

## 2022-04-22 DIAGNOSIS — K219 Gastro-esophageal reflux disease without esophagitis: Secondary | ICD-10-CM | POA: Diagnosis present

## 2022-04-22 DIAGNOSIS — I6389 Other cerebral infarction: Secondary | ICD-10-CM | POA: Diagnosis not present

## 2022-04-22 DIAGNOSIS — Z79899 Other long term (current) drug therapy: Secondary | ICD-10-CM | POA: Diagnosis not present

## 2022-04-22 DIAGNOSIS — Z23 Encounter for immunization: Secondary | ICD-10-CM | POA: Diagnosis not present

## 2022-04-22 DIAGNOSIS — F4310 Post-traumatic stress disorder, unspecified: Secondary | ICD-10-CM | POA: Diagnosis present

## 2022-04-22 LAB — COMPREHENSIVE METABOLIC PANEL
ALT: 20 U/L (ref 0–44)
AST: 20 U/L (ref 15–41)
Albumin: 3.8 g/dL (ref 3.5–5.0)
Alkaline Phosphatase: 62 U/L (ref 38–126)
Anion gap: 10 (ref 5–15)
BUN: 17 mg/dL (ref 8–23)
CO2: 25 mmol/L (ref 22–32)
Calcium: 8.8 mg/dL — ABNORMAL LOW (ref 8.9–10.3)
Chloride: 105 mmol/L (ref 98–111)
Creatinine, Ser: 0.91 mg/dL (ref 0.61–1.24)
GFR, Estimated: >60 mL/min (ref >60)
Glucose, Bld: 147 mg/dL — ABNORMAL HIGH (ref 70–99)
Potassium: 3.9 mmol/L (ref 3.5–5.1)
Sodium: 140 mmol/L (ref 135–145)
Total Bilirubin: 1.1 mg/dL (ref 0.3–1.2)
Total Protein: 6.3 g/dL — ABNORMAL LOW (ref 6.5–8.1)

## 2022-04-22 LAB — CBC WITH DIFFERENTIAL/PLATELET
Abs Immature Granulocytes: 0.03 10*3/uL (ref 0.00–0.07)
Basophils Absolute: 0 10*3/uL (ref 0.0–0.1)
Basophils Relative: 1 %
Eosinophils Absolute: 0.2 10*3/uL (ref 0.0–0.5)
Eosinophils Relative: 3 %
HCT: 43.9 % (ref 39.0–52.0)
Hemoglobin: 14.6 g/dL (ref 13.0–17.0)
Immature Granulocytes: 1 %
Lymphocytes Relative: 29 %
Lymphs Abs: 1.7 10*3/uL (ref 0.7–4.0)
MCH: 29.9 pg (ref 26.0–34.0)
MCHC: 33.3 g/dL (ref 30.0–36.0)
MCV: 89.8 fL (ref 80.0–100.0)
Monocytes Absolute: 0.5 10*3/uL (ref 0.1–1.0)
Monocytes Relative: 9 %
Neutro Abs: 3.4 10*3/uL (ref 1.7–7.7)
Neutrophils Relative %: 57 %
Platelets: 226 10*3/uL (ref 150–400)
RBC: 4.89 MIL/uL (ref 4.22–5.81)
RDW: 12.3 % (ref 11.5–15.5)
WBC: 5.9 10*3/uL (ref 4.0–10.5)
nRBC: 0 % (ref 0.0–0.2)

## 2022-04-22 LAB — ECHOCARDIOGRAM COMPLETE
AR max vel: 2.38 cm2
AV Area VTI: 2.72 cm2
AV Area mean vel: 2.31 cm2
AV Mean grad: 3 mmHg
AV Peak grad: 5.2 mmHg
Ao pk vel: 1.14 m/s
Area-P 1/2: 3.27 cm2
Height: 65 in
MV VTI: 2.46 cm2
S' Lateral: 2.8 cm
Weight: 3105.6 oz

## 2022-04-22 LAB — URINE CULTURE: Culture: NO GROWTH

## 2022-04-22 LAB — LIPID PANEL
Cholesterol: 127 mg/dL (ref 0–200)
HDL: 32 mg/dL — ABNORMAL LOW (ref >40)
LDL Cholesterol: 69 mg/dL (ref 0–99)
Total CHOL/HDL Ratio: 4 RATIO
Triglycerides: 130 mg/dL (ref <150)
VLDL: 26 mg/dL (ref 0–40)

## 2022-04-22 LAB — MAGNESIUM: Magnesium: 1.9 mg/dL (ref 1.7–2.4)

## 2022-04-22 LAB — HEMOGLOBIN A1C
Hgb A1c MFr Bld: 6.6 % — ABNORMAL HIGH (ref 4.8–5.6)
Mean Plasma Glucose: 142.72 mg/dL

## 2022-04-22 LAB — TSH: TSH: 3.523 u[IU]/mL (ref 0.350–4.500)

## 2022-04-22 MED ORDER — GADOPICLENOL 0.5 MMOL/ML IV SOLN
10.0000 mL | Freq: Once | INTRAVENOUS | Status: AC | PRN
  Administered 2022-04-22: 10 mL via INTRAVENOUS

## 2022-04-22 MED ORDER — ONDANSETRON HCL 4 MG/2ML IJ SOLN: 4.0000 mg | Freq: Four times a day (QID) | INTRAMUSCULAR | Status: DC | PRN

## 2022-04-22 MED ORDER — ASPIRIN 81 MG PO TBEC
81.0000 mg | DELAYED_RELEASE_TABLET | Freq: Every day | ORAL | Status: DC
  Administered 2022-04-22 – 2022-04-23 (×2): 81 mg via ORAL
  Filled 2022-04-22 (×2): qty 1

## 2022-04-22 MED ORDER — HEPARIN SODIUM (PORCINE) 5000 UNIT/ML IJ SOLN
5000.0000 [IU] | Freq: Three times a day (TID) | INTRAMUSCULAR | Status: DC
  Administered 2022-04-22 – 2022-04-23 (×4): 5000 [IU] via SUBCUTANEOUS
  Filled 2022-04-22 (×4): qty 1

## 2022-04-22 MED ORDER — OXYCODONE HCL 5 MG PO TABS: 5.0000 mg | ORAL_TABLET | ORAL | Status: DC | PRN

## 2022-04-22 MED ORDER — ACETAMINOPHEN 325 MG PO TABS: 650.0000 mg | ORAL_TABLET | Freq: Four times a day (QID) | ORAL | Status: DC | PRN

## 2022-04-22 MED ORDER — INFLUENZA VAC A&B SA ADJ QUAD 0.5 ML IM PRSY
0.5000 mL | PREFILLED_SYRINGE | INTRAMUSCULAR | Status: AC
  Administered 2022-04-23: 0.5 mL via INTRAMUSCULAR
  Filled 2022-04-22: qty 0.5

## 2022-04-22 MED ORDER — ONDANSETRON HCL 4 MG PO TABS: 4.0000 mg | ORAL_TABLET | Freq: Four times a day (QID) | ORAL | Status: DC | PRN

## 2022-04-22 MED ORDER — CLOPIDOGREL BISULFATE 75 MG PO TABS
75.0000 mg | ORAL_TABLET | Freq: Every day | ORAL | Status: DC
  Administered 2022-04-22 – 2022-04-23 (×2): 75 mg via ORAL
  Filled 2022-04-22 (×2): qty 1

## 2022-04-22 MED ORDER — ACETAMINOPHEN 650 MG RE SUPP: 650.0000 mg | Freq: Four times a day (QID) | RECTAL | Status: DC | PRN

## 2022-04-22 MED ORDER — ROSUVASTATIN CALCIUM 10 MG PO TABS
10.0000 mg | ORAL_TABLET | Freq: Every day | ORAL | Status: DC
  Administered 2022-04-22 – 2022-04-23 (×2): 10 mg via ORAL
  Filled 2022-04-22 (×2): qty 1

## 2022-04-22 MED ORDER — MORPHINE SULFATE (PF) 2 MG/ML IV SOLN: 2.0000 mg | INTRAVENOUS | Status: DC | PRN

## 2022-04-22 NOTE — Progress Notes (Signed)
Upon assessment this AM pt was able swallow PO medications with no issues. Will continue to monitor pt.

## 2022-04-22 NOTE — H&P (Signed)
History and Physical    Patient: [name] OZH:[id number] DOB: 1946-03-19 DOA: 04/21/2022 DOS: the patient was seen and examined on 04/22/2022 PCP: Benita Stabile, MD  Patient coming from: Home  Chief Complaint:  Chief Complaint  Patient presents with   Altered Mental Status   HPI: [name] is a 76 y.o. male with medical history significant of coronary artery disease, depression, hypertension, GERD, hyperlipidemia, and more presents the ED with a chief complaint of altered mental status and ataxia.  Patient reports that on Friday he could not get dressed, and could not talk.  He had an EMT next-door who checked his blood pressure and it was extremely high he said it was in the 200s over 100s.  Patient was confused.  He had a bad headache that was over the right eye.  Patient reports no change in vision, but that he wanted to call 911 and could not figure out how to use the phone.  He could not get himself coordinated to walk around the house, or to get his legs and his pants.  He felt weak everywhere, but reports that the paramedic next-door told him he had left-sided weakness.  He had slurred speech.  He was not having difficulty swallowing and has been able to eat.  Patient reports she is has not felt well since then.  Today the ataxia got worse.  He can walk around his house without holding onto things.  Someone reported the ED that he was having hallucinations.  Patient did not feel safe going home.  Patient reports he has had a stroke in the past but only affected his left eye.  Neuro was consulted from the ED and reports that there is only subacute strokes and not a new stroke and that he does not need an inpatient stroke work-up.  Patient cannot be discharged home safely as he had no need to help take care of him as his relatives that were with him in the ER older than him.  MRI showed 2 small adjacent acute infarcts within the posterior frontal lobe white matter in the left precentral  gyrus.  Few small subacute appearing infarcts within the left greater than right centrum semiovale.  We will get inpatient neuro consult for further patients.  Patient has normal UA.  No leukocytosis.  Tropes are normal.  Patient has no other complaints at this time.  Patient does not smoke, does not drink, does not use illicit drugs.  He is not vaccinated for COVID.  Patient is DNR. Review of Systems: As mentioned in the history of present illness. All other systems reviewed and are negative. Past Medical History:  Diagnosis Date   CAD (coronary artery disease)    Depression    Essential hypertension    GERD (gastroesophageal reflux disease)    Mixed hyperlipidemia    PTSD (post-traumatic stress disorder)    Past Surgical History:  Procedure Laterality Date   ORTHOPEDIC SURGERY     Social History:  reports that he has quit smoking. His smoking use included cigarettes. He has never used smokeless tobacco. He reports that he does not drink alcohol and does not use drugs.  Allergies  Allergen Reactions   Simvastatin Rash    Other reaction(s): Muscle Pain, Other (See Comments)    Family History  Problem Relation Age of Onset   CVA Father        Cerebral hemorrhage    Prior to Admission medications   Medication Sig Start  Date End Date Taking? Authorizing Provider  cefadroxil (DURICEF) 500 MG capsule Take 1 capsule (500 mg total) by mouth 2 (two) times daily for 7 days. 04/18/22 04/25/22 Yes Kommor, [place], MD  HYDROcodone-acetaminophen (NORCO/VICODIN) 5-325 MG tablet One tablet every four hours for pain. 03/19/22  Yes Darreld Mclean, MD  metFORMIN (GLUCOPHAGE) 500 MG tablet Take 500 mg by mouth every evening. 01/14/21  Yes [provider]  rosuvastatin (CRESTOR) 10 MG tablet TAKE ONE TABLET BY MOUTH ONCE DAILY. 12/21/20  Yes Jonelle Sidle, MD  amLODipine (NORVASC) 5 MG tablet TAKE ONE TABLET BY MOUTH ONCE DAILY. Patient not taking: Reported on 04/21/2022 09/04/20   Jonelle Sidle, MD  aspirin EC 81 MG tablet Take 1 tablet (81 mg total) by mouth daily. Patient not taking: Reported on 04/21/2022 08/16/19   Jonelle Sidle, MD  metoprolol succinate (TOPROL XL) 25 MG 24 hr tablet Take 1 tablet (25 mg total) by mouth daily. Patient not taking: Reported on 04/21/2022 08/16/19   Jonelle Sidle, MD  omeprazole (PRILOSEC) 20 MG capsule Take 1 capsule by mouth daily. Patient not taking: Reported on 04/21/2022 10/04/20   [provider]  ondansetron (ZOFRAN-ODT) 4 MG disintegrating tablet 4mg  ODT q4 hours prn nausea/vomit Patient not taking: Reported on 04/21/2022 11/14/21   Bethann Berkshire, MD  pantoprazole (PROTONIX) 20 MG tablet Take 1 tablet (20 mg total) by mouth daily. Patient not taking: Reported on 04/21/2022 11/14/21   Bethann Berkshire, MD  sertraline (ZOLOFT) 100 MG tablet Take 1 tablet by mouth daily. Patient not taking: Reported on 04/21/2022 09/05/21   [provider]  Vibegron (GEMTESA) 75 MG TABS Take 75 mg by mouth daily. Patient not taking: Reported on 04/21/2022 06/17/21   Milderd Meager., MD    Physical Exam: Vitals:   04/21/22 2309 04/21/22 2330 04/21/22 2345 04/22/22 0034  BP: (!) 152/73 (!) 146/68  (!) 183/70  Pulse: 74 72  75  Resp: (!) 22 18  20   Temp: 97.6 F (36.4 C)     TempSrc: Oral     SpO2: 97%  96% 98%  Weight:    88 kg  Height:    5\' 5"  (1.651 m)   1.  General: Patient lying supine in bed,  no acute distress   2. Psychiatric: Alert and oriented x 3, mood and behavior normal for situation, pleasant and cooperative with exam   3. Neurologic: Speech and language are normal, face is symmetric, moves all 4 extremities voluntarily, sensation intact, Very subtle left grip strength is less than right grip strength, at baseline without acute deficits on limited exam   4. HEENMT:  Head is atraumatic, normocephalic, pupils reactive to light, neck is supple, trachea is midline, mucous membranes are moist   5.  Respiratory : Lungs are clear to auscultation bilaterally without wheezing, rhonchi, rales, no cyanosis, no increase in work of breathing or accessory muscle use   6. Cardiovascular : Heart rate normal, rhythm is regular, no murmurs, rubs or gallops, no peripheral edema, peripheral pulses palpated   7. Gastrointestinal:  Abdomen is soft, nondistended, nontender to palpation bowel sounds active, no masses or organomegaly palpated   8. Skin:  Skin is warm, dry and intact without rashes, acute lesions, or ulcers on limited exam   9.Musculoskeletal:  No acute deformities or trauma, no asymmetry in tone, no peripheral edema, peripheral pulses palpated, no tenderness to palpation in the extremities  Data Reviewed: In the ED Temp 98.4, heart rate 55-76,  blood pressure 117/63-196/102, satting 98% No leukocytosis, hemoglobin stable at 14.8 Chemistry unremarkable Trope 4, 5 UA is not indicative of UTI CT head shows suspected evolving subacute infarct MRI shows 2 small adjacent acute infarcts that neuro thought were some kind of textbook artifact finding Patient was ambulated in the ED and was ataxic.  Patient lives alone and was afraid to go home Admission requested for August PT eval and treat possible placement versus home health  Assessment and Plan: * Ataxia - With subacute evolving infarct on CTA and acute versus subacute infarct on MRI - Continue aspirin and statin - PT eval and treat - Consult neuro - May need placement versus home health  Acute metabolic encephalopathy - Patient reports confusion when his ataxia started a couple of days ago - MRI brain shows acute versus subacute infarcts -Inpatient consult to neuro - Patient was having hallucinations and did not feel safe to go home, could not ambulate and lives by himself - [facility] consult for home health versus SNF - UA is not indicative of UTI - CT head shows sepsis suspected evolving subacute infarct involving the right  middle frontal gyrus, patient did have a headache on the right side - Neuro consult from the ED was not convinced that stroke work-up is required - Continue to monitor  Mixed hyperlipidemia - Continue statin      Advance Care Planning:   Code Status: DNR   Consults: Neurology  Family Communication: None family at bedside  Severity of Illness: The appropriate patient status for this patient is OBSERVATION. Observation status is judged to be reasonable and necessary in order to provide the required intensity of service to ensure the patient's safety. The patient's presenting symptoms, physical exam findings, and initial radiographic and laboratory data in the context of their medical condition is felt to place them at decreased risk for further clinical deterioration. Furthermore, it is anticipated that the patient will be medically stable for discharge from the hospital within 2 midnights of admission.   Author: Lilyan Gilford, DO 04/22/2022 5:39 AM  For on call review [email or web address].

## 2022-04-22 NOTE — Consult Note (Addendum)
I connected with  [name] on 04/22/22 by a video enabled telemedicine application and verified that I am speaking with the correct person using two identifiers.   I discussed the limitations of evaluation and management by telemedicine. The patient expressed understanding and agreed to proceed.  Location of patient: [facility] Location of physician: [facility] hospital  Neurology Consultation Reason for Consult: stroke Referring Physician: Dr Dannielle Huh  CC: hallucinations, ams  History is obtained from: patient, chart review  HPI: [name] is a 76 y.o. male hypertension, hyperlipidemia, coronary artery disease who presented with confusion.  Patient states over the weekend he had right-sided numbness briefly which then resolved.  However on Monday he felt sudden headache with pressure behind his right eye as well as flickering lights.  He also noticed that his gait was unstable.  He talked to his neighbor who is a paramedic and checked his blood pressure which was over 409 systolic.  As his symptoms did not improve, he eventually decided to come to the hospital.  Patient denies any other visual hallucinations specifically seeing people or animals etc.  States he feels significantly improved and almost back to baseline now  Of note, patient was also seen in the emergency room on 04/18/2022 with similar symptoms and was discharged with patient's outpatient follow-up.  Patient states he was on antihypertensives but his PCP told him to stop them because his blood pressure was usually normal.  Also states he has been told about the right carotid mass and was seen about 3 to 4 years ago and recommended follow-up every 5 years.  Last known normal: 04/18/2022 Event happened at home No tPA as outside window No thrombectomy as no large vessel occlusion mRS 0  ROS: All other systems reviewed and negative except as noted in the HPI.   Past Medical History:  Diagnosis Date   CAD (coronary artery  disease)    Depression    Essential hypertension    GERD (gastroesophageal reflux disease)    Mixed hyperlipidemia    PTSD (post-traumatic stress disorder)     Family History  Problem Relation Age of Onset   CVA Father        Cerebral hemorrhage    Social History:  reports that he has quit smoking. His smoking use included cigarettes. He has never used smokeless tobacco. He reports that he does not drink alcohol and does not use drugs.   Medications Prior to Admission  Medication Sig Dispense Refill Last Dose   buPROPion (WELLBUTRIN SR) 150 MG 12 hr tablet Take 150 mg by mouth 2 (two) times daily.   Past Week   cefadroxil (DURICEF) 500 MG capsule Take 1 capsule (500 mg total) by mouth 2 (two) times daily for 7 days. 14 capsule 0 04/21/2022   cetirizine (ZYRTEC) 10 MG tablet Take 10 mg by mouth daily.   Past Week   HYDROcodone-acetaminophen (NORCO/VICODIN) 5-325 MG tablet One tablet every four hours for pain. 30 tablet 0 unknown   lisinopril (ZESTRIL) 40 MG tablet Take 40 mg by mouth daily.   Past Week   metFORMIN (GLUCOPHAGE) 500 MG tablet Take 500 mg by mouth 2 (two) times daily with a meal.   Past Week   Multiple Vitamin (MULTIVITAMIN WITH MINERALS) TABS tablet Take 1 tablet by mouth daily.   Past Week   omeprazole (PRILOSEC) 20 MG capsule Take 1 capsule by mouth daily.   Past Week   pravastatin (PRAVACHOL) 40 MG tablet Take 40 mg  by mouth daily.   Past Week   sertraline (ZOLOFT) 100 MG tablet Take 1 tablet by mouth 2 (two) times daily.   Past Week   amLODipine (NORVASC) 5 MG tablet TAKE ONE TABLET BY MOUTH ONCE DAILY. (Patient not taking: Reported on 04/21/2022) 90 tablet 3 Not Taking   aspirin EC 81 MG tablet Take 1 tablet (81 mg total) by mouth daily. (Patient not taking: Reported on 04/21/2022) 90 tablet 3 Not Taking   metoprolol succinate (TOPROL XL) 25 MG 24 hr tablet Take 1 tablet (25 mg total) by mouth daily. (Patient not taking: Reported on 04/21/2022) 90 tablet 3 Not Taking    ondansetron (ZOFRAN-ODT) 4 MG disintegrating tablet '4mg'$  ODT q4 hours prn nausea/vomit (Patient not taking: Reported on 04/21/2022) 10 tablet 0 Not Taking   pantoprazole (PROTONIX) 20 MG tablet Take 1 tablet (20 mg total) by mouth daily. (Patient not taking: Reported on 04/21/2022) 20 tablet 0 Not Taking   rosuvastatin (CRESTOR) 10 MG tablet TAKE ONE TABLET BY MOUTH ONCE DAILY. (Patient not taking: Reported on 04/22/2022) 90 tablet 3 Not Taking   Vibegron (GEMTESA) 75 MG TABS Take 75 mg by mouth daily. (Patient not taking: Reported on 04/21/2022) 30 tablet 11 Not Taking      Exam: Current vital signs: BP (!) 148/73 (BP Location: Left Arm)   Pulse 73   Temp 98 F (36.7 C) (Oral)   Resp 20   Ht '5\' 5"'$  (1.651 m)   Wt 88 kg   SpO2 94%   BMI 32.30 kg/m  Vital signs in last 24 hours: Temp:  [97.6 F (36.4 C)-98.4 F (36.9 C)] 98 F (36.7 C) (10/03 0500) Pulse Rate:  [55-76] 73 (10/03 0500) Resp:  [16-22] 20 (10/03 0500) BP: (117-196)/(60-102) 148/73 (10/03 0500) SpO2:  [93 %-100 %] 94 % (10/03 0500) Weight:  [88 kg-90.7 kg] 88 kg (10/03 0034)   Physical Exam  Constitutional: Appears well-developed and well-nourished.  Psych: Affect appropriate to situation Neuro: AOx3, cranial nerves II to XII appear grossly intact, antigravity strength without drift in upper extremities, FTN intact bilaterally, normal sensation to light touch  NIHSS 0  I have reviewed labs in epic and the results pertinent to this consultation are: CBC:  Recent Labs  Lab 04/21/22 1354 04/22/22 0629  WBC 6.3 5.9  NEUTROABS 3.9 3.4  HGB 14.8 14.6  HCT 43.7 43.9  MCV 90.7 89.8  PLT 242 664    Basic Metabolic Panel:  Lab Results  Component Value Date   NA 140 04/22/2022   K 3.9 04/22/2022   CO2 25 04/22/2022   GLUCOSE 147 (H) 04/22/2022   BUN 17 04/22/2022   CREATININE 0.91 04/22/2022   CALCIUM 8.8 (L) 04/22/2022   GFRNONAA >60 04/22/2022   GFRAA >60 08/08/2019   Lipid Panel:  Lab Results   Component Value Date   LDLCALC 69 04/22/2022   HgbA1c: No results found for: "HGBA1C" Urine Drug Screen:     Component Value Date/Time   LABOPIA POSITIVE (A) 04/18/2022 0958   COCAINSCRNUR NONE DETECTED 04/18/2022 0958   LABBENZ NONE DETECTED 04/18/2022 0958   AMPHETMU NONE DETECTED 04/18/2022 0958   THCU NONE DETECTED 04/18/2022 0958   LABBARB NONE DETECTED 04/18/2022 0958    Alcohol Level No results found for: "ETH"   I have reviewed the images obtained:  CTA head and neck with and without contrast 04/21/2022: 1. Mixed plaque at the left carotid bifurcation resulting in approximately 50-60% stenosis of the proximal left internal carotid  artery. Mild mixed plaque on the right without hemodynamically significant stenosis. Patent vertebral arteries. 2. 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    MRI brain without contrast 04/21/2022:  1. Two small adjacent acute infarcts within the posterior left frontal lobe white matter (measuring up to 4 mm). Notably, these infarcts involve the left precentral gyrus. 2. Few small subacute appearing infarcts within the left greater than right centrum semiovale. 3. Small chronic cortical/subcortical infarct within the posterior right frontal lobe, new from the prior brain MRI of 02/21/2021 (right MCA territory) 4. Background mild chronic small vessel changes within the cerebral white matter. 5. Mild generalized cerebral atrophy.    TTE 04/22/2022: No thrombus, no PFO  ASSESSMENT/PLAN: 76 year old male with history, hyperlipidemia who presented with confusion, weakness, headache.  Subacute ischemic stroke Hypertensive emergency -Etiology: Likely cardioembolic -It appears that patient's transient right-sided numbness could have been due  to the left precentral gyrus stroke.  The strokes did not have ADC correlate   Recommendations: -Recommend aspirin 81 mg daily as well as Plavix 75 mg daily for 3 weeks followed by aspirin 81 mg daily -Recommend rosuvastatin 10 mg daily -Recommend 30-day event monitor to look for paroxysmal A-fib -We will also check orthostatic blood pressure -Recommend management of stroke risk factors including goal blood pressure: Normotension -Stroke education including BEFAST -Follow-up with neurology in 3 months -Discussed plan with patient and friend at bedside, Dr. Manuella Ghazi via secure chat  Right carotid mass -Recommend vascular surgery consult  Thank you for allowing [place] to participate in the care of this patient. If you have any further questions, please contact  me or neurohospitalist.   Zeb [facility] neurohospitalist

## 2022-04-22 NOTE — Progress Notes (Signed)
*  PRELIMINARY RESULTS* Echocardiogram 2D Echocardiogram has been performed.  [name] 04/22/2022, 10:20 AM

## 2022-04-22 NOTE — Assessment & Plan Note (Signed)
Continue statin. 

## 2022-04-22 NOTE — Evaluation (Addendum)
Physical Therapy Evaluation Patient Details Name: [name] MRN: [id number] DOB: 03-Feb-1946 Today's Date: 04/22/2022  History of Present Illness  [name] is a 76 y.o. male with medical history significant of coronary artery disease, depression, hypertension, GERD, hyperlipidemia, and more presents the ED with a chief complaint of altered mental status and ataxia.  Patient reports that on Friday he could not get dressed, and could not talk.  He had an EMT next-door who checked his blood pressure and it was extremely high he said it was in the 200s over 100s.  Patient was confused.  He had a bad headache that was over the right eye.  Patient reports no change in vision, but that he wanted to call 911 and could not figure out how to use the phone.  He could not get himself coordinated to walk around the house, or to get his legs and his pants.  He felt weak everywhere, but reports that the paramedic next-door told him he had left-sided weakness.  He had slurred speech.  He was not having difficulty swallowing and has been able to eat.  Patient reports she is has not felt well since then.  Today the ataxia got worse.  He can walk around his house without holding onto things.  Someone reported the ED that he was having hallucinations.  Patient did not feel safe going home.  Patient reports he has had a stroke in the past but only affected his left eye.  Neuro was consulted from the ED and reports that there is only subacute strokes and not a new stroke and that he does not need an inpatient stroke work-up.  Patient cannot be discharged home safely as he had no need to help take care of him as his relatives that were with him in the ER older than him.  MRI showed 2 small adjacent acute infarcts within the posterior frontal lobe white matter in the left precentral gyrus.  Few small subacute appearing infarcts within the left greater than right centrum semiovale.  We will get inpatient neuro consult for  further patients.  Patient has normal UA.  No leukocytosis.  Tropes are normal.  Patient has no other complaints at this time.   Clinical Impression  Patient functioning near baseline for functional mobility and gait aside from LLE limp due to previous ankle injury. Patient with good return for ambulation in hallway without loss of balance or need for an AD. Plan: patient discharged from physical therapy to care of nursing for ambulation daily as tolerated for length of stay.        Recommendations for follow up therapy are one component of a multi-disciplinary discharge planning process, led by the attending physician.  Recommendations may be updated based on patient status, additional functional criteria and insurance authorization.  Follow Up Recommendations No PT follow up      Assistance Recommended at Discharge PRN  Patient can return home with the following  Assistance with cooking/housework;Help with stairs or ramp for entrance    Equipment Recommendations None recommended by PT  Recommendations for Other Services       Functional Status Assessment Patient has not had a recent decline in their functional status     Precautions / Restrictions Precautions Precautions: Fall Restrictions Weight Bearing Restrictions: No      Mobility  Bed Mobility Overal bed mobility: Independent             General bed mobility comments: increased time, able to  push through arms w/o use of handrails    Transfers Overall transfer level: Independent Equipment used: None               General transfer comment: able to sit/stand well w/o AD before and after hallway ambulation    Ambulation/Gait Ambulation/Gait assistance: Modified independent (Device/Increase time) Gait Distance (Feet): 165 Feet Assistive device: None Gait Pattern/deviations: Decreased step length - right, Decreased step length - left, Decreased stride length, Antalgic Gait velocity: decreased      General Gait Details: Patient near baseline for gait with slowed cadence and a left limp due to previous ankle injury  Stairs            Wheelchair Mobility    Modified Rankin (Stroke Patients Only)       Balance Overall balance assessment: Mild deficits observed, not formally tested                                           Pertinent Vitals/Pain Pain Assessment Pain Assessment: No/denies pain    Home Living Family/patient expects to be discharged to:: Private residence Living Arrangements: Alone Available Help at Discharge: Available PRN/intermittently;Family Type of Home: House Home Access: Stairs to enter Entrance Stairs-Rails: Left Entrance Stairs-Number of Steps: 4   Home Layout: One level Home Equipment: Grab bars - tub/shower;Wheelchair - Forensic psychologist (2 wheels);Cane - single point;Shower seat - built in [employer] for walk in shower)      Prior Function Prior Level of Function : Independent/Modified Independent             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, drives ADLs Comments: Independent     Hand Dominance        Extremity/Trunk Assessment   Upper Extremity Assessment Upper Extremity Assessment: Overall WFL for tasks assessed    Lower Extremity Assessment Lower Extremity Assessment: Overall WFL for tasks assessed    Cervical / Trunk Assessment Cervical / Trunk Assessment: Normal  Communication   Communication: No difficulties  Cognition Arousal/Alertness: Awake/alert Behavior During Therapy: WFL for tasks assessed/performed Overall Cognitive Status: Within Functional Limits for tasks assessed                                          General Comments      Exercises     Assessment/Plan    PT Assessment Patient does not need any further PT services  PT Problem List         PT Treatment Interventions      PT Goals (Current goals can be found in the Care Plan  section)  Acute Rehab PT Goals Patient Stated Goal: return home PT Goal Formulation: With patient/family Time For Goal Achievement: 04/29/22 Potential to Achieve Goals: Good    Frequency       Co-evaluation               AM-PAC PT "6 Clicks" Mobility  Outcome Measure Help needed turning from your back to your side while in a flat bed without using bedrails?: None Help needed moving from lying on your back to sitting on the side of a flat bed without using bedrails?: None Help needed moving to and from a bed to a chair (including a wheelchair)?: None Help needed standing up from  a chair using your arms (e.g., wheelchair or bedside chair)?: None Help needed to walk in hospital room?: None Help needed climbing 3-5 steps with a railing? : A Little 6 Click Score: 23    End of Session Equipment Utilized During Treatment: Gait belt Activity Tolerance: Patient tolerated treatment well Patient left: in bed;with call bell/phone within reach (seated EOB) Nurse Communication: Mobility status PT Visit Diagnosis: Unsteadiness on feet (R26.81);Other abnormalities of gait and mobility (R26.89);Muscle weakness (generalized) (M62.81)    Time: 3086-5784 PT Time Calculation (min) (ACUTE ONLY): 20 min   Charges:   PT Evaluation $PT Eval Moderate Complexity: 1 Mod PT Treatments $Therapeutic Activity: 8-22 mins        Alan Mulder, SPT

## 2022-04-22 NOTE — TOC Progression Note (Signed)
Transition of Care [facility]) - Progression Note    Patient Details  Name: [name] MRN: [id number] Date of Birth: 23-Dec-1945  [facility] of [facility]) CM/SW Contact  Boneta Lucks, RN Phone Number: 04/22/2022, 3:25 PM  Clinical Narrative:   Screening chart, [facility]. [facility] notification completed # [phone]. PT had no follow up recommendations. DC planning for tomorrow.    Expected Discharge Plan: Home/Self Care Barriers to Discharge: Continued Medical Work up  Expected Discharge Plan and Services Expected Discharge Plan: Home/Self Care

## 2022-04-22 NOTE — Assessment & Plan Note (Signed)
-   With subacute evolving infarct on CTA and acute versus subacute infarct on MRI - Continue aspirin and statin - PT eval and treat - Consult neuro - May need placement versus home health

## 2022-04-22 NOTE — Assessment & Plan Note (Signed)
-   Patient reports confusion when his ataxia started a couple of days ago - MRI brain shows acute versus subacute infarcts -Inpatient consult to neuro - Patient was having hallucinations and did not feel safe to go home, could not ambulate and lives by himself - [facility] consult for home health versus SNF - UA is not indicative of UTI - CT head shows sepsis suspected evolving subacute infarct involving the right middle frontal gyrus, patient did have a headache on the right side - Neuro consult from the ED was not convinced that stroke work-up is required - Continue to monitor

## 2022-04-22 NOTE — Progress Notes (Signed)
[name] is a 76 y.o. male with medical history significant of coronary artery disease, depression, hypertension, GERD, hyperlipidemia, and more presents the ED with a chief complaint of altered mental status and ataxia.  Patient reports that on Friday he could not get dressed, and could not talk.  He had an EMT next-door who checked his blood pressure and it was extremely high he said it was in the 200s over 100s.  Patient was confused.  Patient was admitted for acute encephalopathy associated with ataxia and concern for CVA.  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  His condition has improved and PT evaluation is still pending.  Neurology recommends aspirin and Plavix for 3 weeks followed by aspirin daily and rosuvastatin 10 mg daily.  2D echocardiogram results are pending.  Patient will have outpatient ENT and vascular referral for carotid mass evaluation.  Patient seen and evaluated at bedside and has been admitted after midnight.  Total care time: 30 minutes.

## 2022-04-22 NOTE — Telephone Encounter (Signed)
Instant msg from Dr. Manuella Ghazi requesting a 30 day event monitor for stroke. Patient was enrolled in [employer] and order placed. Monitor to be mailed to pt.

## 2022-04-23 DIAGNOSIS — I63422 Cerebral infarction due to embolism of left anterior cerebral artery: Secondary | ICD-10-CM | POA: Diagnosis not present

## 2022-04-23 DIAGNOSIS — R270 Ataxia, unspecified: Secondary | ICD-10-CM | POA: Diagnosis not present

## 2022-04-23 LAB — BASIC METABOLIC PANEL
Anion gap: 11 (ref 5–15)
BUN: 16 mg/dL (ref 8–23)
CO2: 23 mmol/L (ref 22–32)
Calcium: 9 mg/dL (ref 8.9–10.3)
Chloride: 106 mmol/L (ref 98–111)
Creatinine, Ser: 0.88 mg/dL (ref 0.61–1.24)
GFR, Estimated: >60 mL/min (ref >60)
Glucose, Bld: 141 mg/dL — ABNORMAL HIGH (ref 70–99)
Potassium: 3.9 mmol/L (ref 3.5–5.1)
Sodium: 140 mmol/L (ref 135–145)

## 2022-04-23 LAB — CBC
HCT: 44.8 % (ref 39.0–52.0)
Hemoglobin: 14.6 g/dL (ref 13.0–17.0)
MCH: 29.5 pg (ref 26.0–34.0)
MCHC: 32.6 g/dL (ref 30.0–36.0)
MCV: 90.5 fL (ref 80.0–100.0)
Platelets: 230 10*3/uL (ref 150–400)
RBC: 4.95 MIL/uL (ref 4.22–5.81)
RDW: 12.2 % (ref 11.5–15.5)
WBC: 5.9 10*3/uL (ref 4.0–10.5)
nRBC: 0 % (ref 0.0–0.2)

## 2022-04-23 LAB — MAGNESIUM: Magnesium: 2.2 mg/dL (ref 1.7–2.4)

## 2022-04-23 MED ORDER — CLOPIDOGREL BISULFATE 75 MG PO TABS: 75.0000 mg | ORAL_TABLET | Freq: Every day | ORAL | 0 refills | Status: AC

## 2022-04-23 NOTE — Plan of Care (Signed)
  Problem: Education: Goal: Knowledge of General Education information will improve Description: Including pain rating scale, medication(s)/side effects and non-pharmacologic comfort measures 04/23/2022 0936 by Santa Lighter, RN Outcome: Adequate for Discharge 04/23/2022 0936 by Santa Lighter, RN Outcome: Progressing   Problem: Health Behavior/Discharge Planning: Goal: Ability to manage health-related needs will improve 04/23/2022 0936 by Santa Lighter, RN Outcome: Adequate for Discharge 04/23/2022 0936 by Santa Lighter, RN Outcome: Progressing   Problem: Clinical Measurements: Goal: Ability to maintain clinical measurements within normal limits will improve Outcome: Adequate for Discharge Goal: Will remain free from infection Outcome: Adequate for Discharge Goal: Diagnostic test results will improve Outcome: Adequate for Discharge Goal: Respiratory complications will improve Outcome: Adequate for Discharge Goal: Cardiovascular complication will be avoided Outcome: Adequate for Discharge   Problem: Activity: Goal: Risk for activity intolerance will decrease Outcome: Adequate for Discharge   Problem: Nutrition: Goal: Adequate nutrition will be maintained Outcome: Adequate for Discharge   Problem: Coping: Goal: Level of anxiety will decrease Outcome: Adequate for Discharge   Problem: Elimination: Goal: Will not experience complications related to bowel motility Outcome: Adequate for Discharge Goal: Will not experience complications related to urinary retention Outcome: Adequate for Discharge   Problem: Pain Managment: Goal: General experience of comfort will improve Outcome: Adequate for Discharge   Problem: Safety: Goal: Ability to remain free from injury will improve Outcome: Adequate for Discharge   Problem: Skin Integrity: Goal: Risk for impaired skin integrity will decrease Outcome: Adequate for Discharge

## 2022-04-23 NOTE — Progress Notes (Signed)
Nsg Discharge Note  Admit Date:  04/21/2022 Discharge date: 04/23/2022   [name] to be D/C'd Home per MD order.  AVS completed.   Removed IV-CDI. Reviewed d/c paperwork with patient and answered all questions. NT wheeled stable patient and belongings to main entrance where he was picked up by his friend. Patient/caregiver able to verbalize understanding.  Discharge Medication: Allergies as of 04/23/2022       Reactions   Simvastatin Rash   Other reaction(s): Muscle Pain, Other (See Comments)        Medication List     STOP taking these medications    cefadroxil 500 MG capsule Commonly known as: DURICEF   pravastatin 40 MG tablet Commonly known as: PRAVACHOL       TAKE these medications    amLODipine 5 MG tablet Commonly known as: NORVASC TAKE ONE TABLET BY MOUTH ONCE DAILY.   aspirin EC 81 MG tablet Take 1 tablet (81 mg total) by mouth daily.   buPROPion 150 MG 12 hr tablet Commonly known as: WELLBUTRIN SR Take 150 mg by mouth 2 (two) times daily.   cetirizine 10 MG tablet Commonly known as: ZYRTEC Take 10 mg by mouth daily.   clopidogrel 75 MG tablet Commonly known as: PLAVIX Take 1 tablet (75 mg total) by mouth daily for 21 days.   Gemtesa 75 MG Tabs Generic drug: Vibegron Take 75 mg by mouth daily.   HYDROcodone-acetaminophen 5-325 MG tablet Commonly known as: NORCO/VICODIN One tablet every four hours for pain.   lisinopril 40 MG tablet Commonly known as: ZESTRIL Take 40 mg by mouth daily.   metFORMIN 500 MG tablet Commonly known as: GLUCOPHAGE Take 500 mg by mouth 2 (two) times daily with a meal.   metoprolol succinate 25 MG 24 hr tablet Commonly known as: Toprol XL Take 1 tablet (25 mg total) by mouth daily.   multivitamin with minerals Tabs tablet Take 1 tablet by mouth daily.   omeprazole 20 MG capsule Commonly known as: PRILOSEC Take 1 capsule by mouth daily.   ondansetron 4 MG disintegrating tablet Commonly known as:  ZOFRAN-ODT '4mg'$  ODT q4 hours prn nausea/vomit   pantoprazole 20 MG tablet Commonly known as: PROTONIX Take 1 tablet (20 mg total) by mouth daily.   rosuvastatin 10 MG tablet Commonly known as: CRESTOR TAKE ONE TABLET BY MOUTH ONCE DAILY.   sertraline 100 MG tablet Commonly known as: ZOLOFT Take 1 tablet by mouth 2 (two) times daily.        Discharge Assessment: Vitals:   04/22/22 0500 04/23/22 0540  BP: (!) 148/73 (!) 156/75  Pulse: 73 75  Resp: 20 16  Temp: 98 F (36.7 C) 97.8 F (36.6 C)  SpO2: 94% 96%   Skin clean, dry and intact without evidence of skin break down, no evidence of skin tears noted. IV catheter discontinued intact. Site without signs and symptoms of complications - no redness or edema noted at insertion site, patient denies c/o pain - only slight tenderness at site.  Dressing with slight pressure applied.  D/c Instructions-Education: Discharge instructions given to patient/family with verbalized understanding. D/c education completed with patient/family including follow up instructions, medication list, d/c activities limitations if indicated, with other d/c instructions as indicated by MD - patient able to verbalize understanding, all questions fully answered. Patient instructed to return to ED, call 911, or call MD for any changes in condition.  Patient escorted via [place], and D/C home via private auto.  Santa Lighter, RN  04/23/2022 11:26 AM

## 2022-04-23 NOTE — Discharge Summary (Signed)
mouth daily.   metFORMIN 500 MG tablet Commonly known as: GLUCOPHAGE Take 500 mg by mouth 2 (two) times daily with a meal.   metoprolol succinate 25 MG 24 hr tablet Commonly known as: Toprol XL Take 1 tablet (25 mg total) by mouth daily.   multivitamin with minerals Tabs tablet Take 1 tablet by mouth daily.   omeprazole 20 MG capsule Commonly known as: PRILOSEC Take 1 capsule by mouth daily.   ondansetron 4 MG disintegrating tablet Commonly known as: ZOFRAN-ODT '4mg'$  ODT q4  hours prn nausea/vomit   pantoprazole 20 MG tablet Commonly known as: PROTONIX Take 1 tablet (20 mg total) by mouth daily.   rosuvastatin 10 MG tablet Commonly known as: CRESTOR TAKE ONE TABLET BY MOUTH ONCE DAILY.   sertraline 100 MG tablet Commonly known as: ZOLOFT Take 1 tablet by mouth 2 (two) times daily.        Follow-up Information     Phillips Odor, MD. Schedule an appointment as soon as possible for a visit in 3 month(s).   Specialty: Neurology Contact information: [address] [address] [phone]         Leta Baptist, MD. Go in 1 week(s).   Specialty: Otolaryngology Contact information: [address] [place] [address] [phone]         Celene Squibb, MD. Schedule an appointment as soon as possible for a visit in 2 week(s).   Specialty: Internal Medicine Contact information: [facility] [address] [phone]         Rosetta Posner, MD. Go in 1 week(s).   Specialties: Vascular Surgery, Cardiology Contact information: [place] [address] [phone]                Allergies  Allergen Reactions   Simvastatin Rash    Other reaction(s): Muscle Pain, Other (See Comments)    Consultations: Neurology   Procedures/Studies: MR BRAIN W CONTRAST  Result Date: 04/22/2022 CLINICAL DATA:  Stroke suspected. EXAM: MRI HEAD WITH CONTRAST TECHNIQUE: Multiplanar, multiecho pulse sequences of the brain and surrounding structures were obtained with intravenous contrast. COMPARISON:  MRI of the brain without contrast April 21, 2022. FINDINGS: Small lacunar infarcts are seen in the bilateral centrum semiovale. These are best described on recent noncontrast MRI. Postcontrast images show no evidence of abnormal contrast enhancement. IMPRESSION: No abnormal contrast enhancement. Electronically Signed   By: Pedro Earls M.D.   On: 04/22/2022 13:40   ECHOCARDIOGRAM COMPLETE  Result  Date: 04/22/2022    ECHOCARDIOGRAM REPORT   Patient Name:   [name] Date of Exam: 04/22/2022 Medical Rec #:  [id number]     Height:       65.0 in Accession #:    [id number]    Weight:       194.1 lb Date of Birth:  02-11-1946      BSA:          1.953 m Patient Age:    76 years      BP:           148/73 mmHg Patient Gender: M             HR:           63 bpm. Exam Location:  [facility] Procedure: 2D Echo, Cardiac Doppler and Color Doppler Indications:    Stroke  History:        Patient has no prior history of Echocardiogram  mouth daily.   metFORMIN 500 MG tablet Commonly known as: GLUCOPHAGE Take 500 mg by mouth 2 (two) times daily with a meal.   metoprolol succinate 25 MG 24 hr tablet Commonly known as: Toprol XL Take 1 tablet (25 mg total) by mouth daily.   multivitamin with minerals Tabs tablet Take 1 tablet by mouth daily.   omeprazole 20 MG capsule Commonly known as: PRILOSEC Take 1 capsule by mouth daily.   ondansetron 4 MG disintegrating tablet Commonly known as: ZOFRAN-ODT '4mg'$  ODT q4  hours prn nausea/vomit   pantoprazole 20 MG tablet Commonly known as: PROTONIX Take 1 tablet (20 mg total) by mouth daily.   rosuvastatin 10 MG tablet Commonly known as: CRESTOR TAKE ONE TABLET BY MOUTH ONCE DAILY.   sertraline 100 MG tablet Commonly known as: ZOLOFT Take 1 tablet by mouth 2 (two) times daily.        Follow-up Information     Phillips Odor, MD. Schedule an appointment as soon as possible for a visit in 3 month(s).   Specialty: Neurology Contact information: [address] [address] [phone]         Leta Baptist, MD. Go in 1 week(s).   Specialty: Otolaryngology Contact information: [address] [place] [address] [phone]         Celene Squibb, MD. Schedule an appointment as soon as possible for a visit in 2 week(s).   Specialty: Internal Medicine Contact information: [facility] [address] [phone]         Rosetta Posner, MD. Go in 1 week(s).   Specialties: Vascular Surgery, Cardiology Contact information: [place] [address] [phone]                Allergies  Allergen Reactions   Simvastatin Rash    Other reaction(s): Muscle Pain, Other (See Comments)    Consultations: Neurology   Procedures/Studies: MR BRAIN W CONTRAST  Result Date: 04/22/2022 CLINICAL DATA:  Stroke suspected. EXAM: MRI HEAD WITH CONTRAST TECHNIQUE: Multiplanar, multiecho pulse sequences of the brain and surrounding structures were obtained with intravenous contrast. COMPARISON:  MRI of the brain without contrast April 21, 2022. FINDINGS: Small lacunar infarcts are seen in the bilateral centrum semiovale. These are best described on recent noncontrast MRI. Postcontrast images show no evidence of abnormal contrast enhancement. IMPRESSION: No abnormal contrast enhancement. Electronically Signed   By: Pedro Earls M.D.   On: 04/22/2022 13:40   ECHOCARDIOGRAM COMPLETE  Result  Date: 04/22/2022    ECHOCARDIOGRAM REPORT   Patient Name:   [name] Date of Exam: 04/22/2022 Medical Rec #:  [id number]     Height:       65.0 in Accession #:    [id number]    Weight:       194.1 lb Date of Birth:  02-11-1946      BSA:          1.953 m Patient Age:    76 years      BP:           148/73 mmHg Patient Gender: M             HR:           63 bpm. Exam Location:  [facility] Procedure: 2D Echo, Cardiac Doppler and Color Doppler Indications:    Stroke  History:        Patient has no prior history of Echocardiogram  Physician Discharge Summary  [name]:[id number] DOB: 1945/07/27 DOA: 04/21/2022  PCP: Celene Squibb, MD  Admit date: 04/21/2022  Discharge date: 04/23/2022  Admitted From:Home  Disposition:  Home  Recommendations for Outpatient Follow-up:  Follow up with PCP in 1-2 weeks Follow-up with ENT and vascular surgery as recommended for carotid mass evaluation Follow-up with neurology Dr. Merlene Laughter as recommended in 3 months for further evaluation of CVA Remain on aspirin and Plavix as prescribed for 3 weeks and then aspirin only thereafter Can continue on Crestor 30-day Holter monitor to be sent at home for evaluation of arrhythmias  Home Health: None  Equipment/Devices: None  Discharge Condition:Stable  CODE STATUS: DNR  Diet recommendation: Heart Healthy  Brief/Interim Summary: [name] is a 76 y.o. male with medical history significant of coronary artery disease, depression, hypertension, GERD, hyperlipidemia, and more presents the ED with a chief complaint of altered mental status and ataxia.  Patient reports that on Friday he could not get dressed, and could not talk.  He had an EMT next-door who checked his blood pressure and it was extremely high he said it was in the 200s over 100s.  Patient was confused.  Patient was admitted for acute encephalopathy associated with ataxia and concern for CVA.  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  Neurology recommending aspirin and Plavix for 3 weeks followed by aspirin daily as well as rosuvastatin daily.  2D echocardiogram without any significant findings and PT with no recommendations at home.  ENT and vascular referral arranged for carotid mass incidentally noted on head and neck CT imaging.  He is in stable condition for discharge today.  Discharge Diagnoses:  Principal Problem:   Ataxia Active Problems:   Mixed hyperlipidemia   Acute metabolic encephalopathy  Principal discharge  diagnosis: Subacute ischemic CVA with hypertensive emergency.  Discharge Instructions  Discharge Instructions     Ambulatory referral to ENT   Complete by: As directed    Ambulatory referral to Vascular Surgery   Complete by: As directed    Diet - low sodium heart healthy   Complete by: As directed    Increase activity slowly   Complete by: As directed       Allergies as of 04/23/2022       Reactions   Simvastatin Rash   Other reaction(s): Muscle Pain, Other (See Comments)        Medication List     STOP taking these medications    cefadroxil 500 MG capsule Commonly known as: DURICEF   pravastatin 40 MG tablet Commonly known as: PRAVACHOL       TAKE these medications    amLODipine 5 MG tablet Commonly known as: NORVASC TAKE ONE TABLET BY MOUTH ONCE DAILY.   aspirin EC 81 MG tablet Take 1 tablet (81 mg total) by mouth daily.   buPROPion 150 MG 12 hr tablet Commonly known as: WELLBUTRIN SR Take 150 mg by mouth 2 (two) times daily.   cetirizine 10 MG tablet Commonly known as: ZYRTEC Take 10 mg by mouth daily.   clopidogrel 75 MG tablet Commonly known as: PLAVIX Take 1 tablet (75 mg total) by mouth daily for 21 days.   Gemtesa 75 MG Tabs Generic drug: Vibegron Take 75 mg by mouth daily.   HYDROcodone-acetaminophen 5-325 MG tablet Commonly known as: NORCO/VICODIN One tablet every four hours for pain.   lisinopril 40 MG tablet Commonly known as: ZESTRIL Take 40 mg by  mouth daily.   metFORMIN 500 MG tablet Commonly known as: GLUCOPHAGE Take 500 mg by mouth 2 (two) times daily with a meal.   metoprolol succinate 25 MG 24 hr tablet Commonly known as: Toprol XL Take 1 tablet (25 mg total) by mouth daily.   multivitamin with minerals Tabs tablet Take 1 tablet by mouth daily.   omeprazole 20 MG capsule Commonly known as: PRILOSEC Take 1 capsule by mouth daily.   ondansetron 4 MG disintegrating tablet Commonly known as: ZOFRAN-ODT '4mg'$  ODT q4  hours prn nausea/vomit   pantoprazole 20 MG tablet Commonly known as: PROTONIX Take 1 tablet (20 mg total) by mouth daily.   rosuvastatin 10 MG tablet Commonly known as: CRESTOR TAKE ONE TABLET BY MOUTH ONCE DAILY.   sertraline 100 MG tablet Commonly known as: ZOLOFT Take 1 tablet by mouth 2 (two) times daily.        Follow-up Information     Phillips Odor, MD. Schedule an appointment as soon as possible for a visit in 3 month(s).   Specialty: Neurology Contact information: [address] [address] [phone]         Leta Baptist, MD. Go in 1 week(s).   Specialty: Otolaryngology Contact information: [address] [place] [address] [phone]         Celene Squibb, MD. Schedule an appointment as soon as possible for a visit in 2 week(s).   Specialty: Internal Medicine Contact information: [facility] [address] [phone]         Rosetta Posner, MD. Go in 1 week(s).   Specialties: Vascular Surgery, Cardiology Contact information: [place] [address] [phone]                Allergies  Allergen Reactions   Simvastatin Rash    Other reaction(s): Muscle Pain, Other (See Comments)    Consultations: Neurology   Procedures/Studies: MR BRAIN W CONTRAST  Result Date: 04/22/2022 CLINICAL DATA:  Stroke suspected. EXAM: MRI HEAD WITH CONTRAST TECHNIQUE: Multiplanar, multiecho pulse sequences of the brain and surrounding structures were obtained with intravenous contrast. COMPARISON:  MRI of the brain without contrast April 21, 2022. FINDINGS: Small lacunar infarcts are seen in the bilateral centrum semiovale. These are best described on recent noncontrast MRI. Postcontrast images show no evidence of abnormal contrast enhancement. IMPRESSION: No abnormal contrast enhancement. Electronically Signed   By: Pedro Earls M.D.   On: 04/22/2022 13:40   ECHOCARDIOGRAM COMPLETE  Result  Date: 04/22/2022    ECHOCARDIOGRAM REPORT   Patient Name:   [name] Date of Exam: 04/22/2022 Medical Rec #:  [id number]     Height:       65.0 in Accession #:    [id number]    Weight:       194.1 lb Date of Birth:  02-11-1946      BSA:          1.953 m Patient Age:    76 years      BP:           148/73 mmHg Patient Gender: M             HR:           63 bpm. Exam Location:  [facility] Procedure: 2D Echo, Cardiac Doppler and Color Doppler Indications:    Stroke  History:        Patient has no prior history of Echocardiogram  Physician Discharge Summary  [name]:[id number] DOB: 1945/07/27 DOA: 04/21/2022  PCP: Celene Squibb, MD  Admit date: 04/21/2022  Discharge date: 04/23/2022  Admitted From:Home  Disposition:  Home  Recommendations for Outpatient Follow-up:  Follow up with PCP in 1-2 weeks Follow-up with ENT and vascular surgery as recommended for carotid mass evaluation Follow-up with neurology Dr. Merlene Laughter as recommended in 3 months for further evaluation of CVA Remain on aspirin and Plavix as prescribed for 3 weeks and then aspirin only thereafter Can continue on Crestor 30-day Holter monitor to be sent at home for evaluation of arrhythmias  Home Health: None  Equipment/Devices: None  Discharge Condition:Stable  CODE STATUS: DNR  Diet recommendation: Heart Healthy  Brief/Interim Summary: [name] is a 76 y.o. male with medical history significant of coronary artery disease, depression, hypertension, GERD, hyperlipidemia, and more presents the ED with a chief complaint of altered mental status and ataxia.  Patient reports that on Friday he could not get dressed, and could not talk.  He had an EMT next-door who checked his blood pressure and it was extremely high he said it was in the 200s over 100s.  Patient was confused.  Patient was admitted for acute encephalopathy associated with ataxia and concern for CVA.  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  Neurology recommending aspirin and Plavix for 3 weeks followed by aspirin daily as well as rosuvastatin daily.  2D echocardiogram without any significant findings and PT with no recommendations at home.  ENT and vascular referral arranged for carotid mass incidentally noted on head and neck CT imaging.  He is in stable condition for discharge today.  Discharge Diagnoses:  Principal Problem:   Ataxia Active Problems:   Mixed hyperlipidemia   Acute metabolic encephalopathy  Principal discharge  diagnosis: Subacute ischemic CVA with hypertensive emergency.  Discharge Instructions  Discharge Instructions     Ambulatory referral to ENT   Complete by: As directed    Ambulatory referral to Vascular Surgery   Complete by: As directed    Diet - low sodium heart healthy   Complete by: As directed    Increase activity slowly   Complete by: As directed       Allergies as of 04/23/2022       Reactions   Simvastatin Rash   Other reaction(s): Muscle Pain, Other (See Comments)        Medication List     STOP taking these medications    cefadroxil 500 MG capsule Commonly known as: DURICEF   pravastatin 40 MG tablet Commonly known as: PRAVACHOL       TAKE these medications    amLODipine 5 MG tablet Commonly known as: NORVASC TAKE ONE TABLET BY MOUTH ONCE DAILY.   aspirin EC 81 MG tablet Take 1 tablet (81 mg total) by mouth daily.   buPROPion 150 MG 12 hr tablet Commonly known as: WELLBUTRIN SR Take 150 mg by mouth 2 (two) times daily.   cetirizine 10 MG tablet Commonly known as: ZYRTEC Take 10 mg by mouth daily.   clopidogrel 75 MG tablet Commonly known as: PLAVIX Take 1 tablet (75 mg total) by mouth daily for 21 days.   Gemtesa 75 MG Tabs Generic drug: Vibegron Take 75 mg by mouth daily.   HYDROcodone-acetaminophen 5-325 MG tablet Commonly known as: NORCO/VICODIN One tablet every four hours for pain.   lisinopril 40 MG tablet Commonly known as: ZESTRIL Take 40 mg by  Physician Discharge Summary  [name]:[id number] DOB: 1945/07/27 DOA: 04/21/2022  PCP: Celene Squibb, MD  Admit date: 04/21/2022  Discharge date: 04/23/2022  Admitted From:Home  Disposition:  Home  Recommendations for Outpatient Follow-up:  Follow up with PCP in 1-2 weeks Follow-up with ENT and vascular surgery as recommended for carotid mass evaluation Follow-up with neurology Dr. Merlene Laughter as recommended in 3 months for further evaluation of CVA Remain on aspirin and Plavix as prescribed for 3 weeks and then aspirin only thereafter Can continue on Crestor 30-day Holter monitor to be sent at home for evaluation of arrhythmias  Home Health: None  Equipment/Devices: None  Discharge Condition:Stable  CODE STATUS: DNR  Diet recommendation: Heart Healthy  Brief/Interim Summary: [name] is a 76 y.o. male with medical history significant of coronary artery disease, depression, hypertension, GERD, hyperlipidemia, and more presents the ED with a chief complaint of altered mental status and ataxia.  Patient reports that on Friday he could not get dressed, and could not talk.  He had an EMT next-door who checked his blood pressure and it was extremely high he said it was in the 200s over 100s.  Patient was confused.  Patient was admitted for acute encephalopathy associated with ataxia and concern for CVA.  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  Neurology recommending aspirin and Plavix for 3 weeks followed by aspirin daily as well as rosuvastatin daily.  2D echocardiogram without any significant findings and PT with no recommendations at home.  ENT and vascular referral arranged for carotid mass incidentally noted on head and neck CT imaging.  He is in stable condition for discharge today.  Discharge Diagnoses:  Principal Problem:   Ataxia Active Problems:   Mixed hyperlipidemia   Acute metabolic encephalopathy  Principal discharge  diagnosis: Subacute ischemic CVA with hypertensive emergency.  Discharge Instructions  Discharge Instructions     Ambulatory referral to ENT   Complete by: As directed    Ambulatory referral to Vascular Surgery   Complete by: As directed    Diet - low sodium heart healthy   Complete by: As directed    Increase activity slowly   Complete by: As directed       Allergies as of 04/23/2022       Reactions   Simvastatin Rash   Other reaction(s): Muscle Pain, Other (See Comments)        Medication List     STOP taking these medications    cefadroxil 500 MG capsule Commonly known as: DURICEF   pravastatin 40 MG tablet Commonly known as: PRAVACHOL       TAKE these medications    amLODipine 5 MG tablet Commonly known as: NORVASC TAKE ONE TABLET BY MOUTH ONCE DAILY.   aspirin EC 81 MG tablet Take 1 tablet (81 mg total) by mouth daily.   buPROPion 150 MG 12 hr tablet Commonly known as: WELLBUTRIN SR Take 150 mg by mouth 2 (two) times daily.   cetirizine 10 MG tablet Commonly known as: ZYRTEC Take 10 mg by mouth daily.   clopidogrel 75 MG tablet Commonly known as: PLAVIX Take 1 tablet (75 mg total) by mouth daily for 21 days.   Gemtesa 75 MG Tabs Generic drug: Vibegron Take 75 mg by mouth daily.   HYDROcodone-acetaminophen 5-325 MG tablet Commonly known as: NORCO/VICODIN One tablet every four hours for pain.   lisinopril 40 MG tablet Commonly known as: ZESTRIL Take 40 mg by  Physician Discharge Summary  [name]:[id number] DOB: 1945/07/27 DOA: 04/21/2022  PCP: Celene Squibb, MD  Admit date: 04/21/2022  Discharge date: 04/23/2022  Admitted From:Home  Disposition:  Home  Recommendations for Outpatient Follow-up:  Follow up with PCP in 1-2 weeks Follow-up with ENT and vascular surgery as recommended for carotid mass evaluation Follow-up with neurology Dr. Merlene Laughter as recommended in 3 months for further evaluation of CVA Remain on aspirin and Plavix as prescribed for 3 weeks and then aspirin only thereafter Can continue on Crestor 30-day Holter monitor to be sent at home for evaluation of arrhythmias  Home Health: None  Equipment/Devices: None  Discharge Condition:Stable  CODE STATUS: DNR  Diet recommendation: Heart Healthy  Brief/Interim Summary: [name] is a 76 y.o. male with medical history significant of coronary artery disease, depression, hypertension, GERD, hyperlipidemia, and more presents the ED with a chief complaint of altered mental status and ataxia.  Patient reports that on Friday he could not get dressed, and could not talk.  He had an EMT next-door who checked his blood pressure and it was extremely high he said it was in the 200s over 100s.  Patient was confused.  Patient was admitted for acute encephalopathy associated with ataxia and concern for CVA.  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  Neurology recommending aspirin and Plavix for 3 weeks followed by aspirin daily as well as rosuvastatin daily.  2D echocardiogram without any significant findings and PT with no recommendations at home.  ENT and vascular referral arranged for carotid mass incidentally noted on head and neck CT imaging.  He is in stable condition for discharge today.  Discharge Diagnoses:  Principal Problem:   Ataxia Active Problems:   Mixed hyperlipidemia   Acute metabolic encephalopathy  Principal discharge  diagnosis: Subacute ischemic CVA with hypertensive emergency.  Discharge Instructions  Discharge Instructions     Ambulatory referral to ENT   Complete by: As directed    Ambulatory referral to Vascular Surgery   Complete by: As directed    Diet - low sodium heart healthy   Complete by: As directed    Increase activity slowly   Complete by: As directed       Allergies as of 04/23/2022       Reactions   Simvastatin Rash   Other reaction(s): Muscle Pain, Other (See Comments)        Medication List     STOP taking these medications    cefadroxil 500 MG capsule Commonly known as: DURICEF   pravastatin 40 MG tablet Commonly known as: PRAVACHOL       TAKE these medications    amLODipine 5 MG tablet Commonly known as: NORVASC TAKE ONE TABLET BY MOUTH ONCE DAILY.   aspirin EC 81 MG tablet Take 1 tablet (81 mg total) by mouth daily.   buPROPion 150 MG 12 hr tablet Commonly known as: WELLBUTRIN SR Take 150 mg by mouth 2 (two) times daily.   cetirizine 10 MG tablet Commonly known as: ZYRTEC Take 10 mg by mouth daily.   clopidogrel 75 MG tablet Commonly known as: PLAVIX Take 1 tablet (75 mg total) by mouth daily for 21 days.   Gemtesa 75 MG Tabs Generic drug: Vibegron Take 75 mg by mouth daily.   HYDROcodone-acetaminophen 5-325 MG tablet Commonly known as: NORCO/VICODIN One tablet every four hours for pain.   lisinopril 40 MG tablet Commonly known as: ZESTRIL Take 40 mg by  Physician Discharge Summary  [name]:[id number] DOB: 1945/07/27 DOA: 04/21/2022  PCP: Celene Squibb, MD  Admit date: 04/21/2022  Discharge date: 04/23/2022  Admitted From:Home  Disposition:  Home  Recommendations for Outpatient Follow-up:  Follow up with PCP in 1-2 weeks Follow-up with ENT and vascular surgery as recommended for carotid mass evaluation Follow-up with neurology Dr. Merlene Laughter as recommended in 3 months for further evaluation of CVA Remain on aspirin and Plavix as prescribed for 3 weeks and then aspirin only thereafter Can continue on Crestor 30-day Holter monitor to be sent at home for evaluation of arrhythmias  Home Health: None  Equipment/Devices: None  Discharge Condition:Stable  CODE STATUS: DNR  Diet recommendation: Heart Healthy  Brief/Interim Summary: [name] is a 76 y.o. male with medical history significant of coronary artery disease, depression, hypertension, GERD, hyperlipidemia, and more presents the ED with a chief complaint of altered mental status and ataxia.  Patient reports that on Friday he could not get dressed, and could not talk.  He had an EMT next-door who checked his blood pressure and it was extremely high he said it was in the 200s over 100s.  Patient was confused.  Patient was admitted for acute encephalopathy associated with ataxia and concern for CVA.  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  Neurology recommending aspirin and Plavix for 3 weeks followed by aspirin daily as well as rosuvastatin daily.  2D echocardiogram without any significant findings and PT with no recommendations at home.  ENT and vascular referral arranged for carotid mass incidentally noted on head and neck CT imaging.  He is in stable condition for discharge today.  Discharge Diagnoses:  Principal Problem:   Ataxia Active Problems:   Mixed hyperlipidemia   Acute metabolic encephalopathy  Principal discharge  diagnosis: Subacute ischemic CVA with hypertensive emergency.  Discharge Instructions  Discharge Instructions     Ambulatory referral to ENT   Complete by: As directed    Ambulatory referral to Vascular Surgery   Complete by: As directed    Diet - low sodium heart healthy   Complete by: As directed    Increase activity slowly   Complete by: As directed       Allergies as of 04/23/2022       Reactions   Simvastatin Rash   Other reaction(s): Muscle Pain, Other (See Comments)        Medication List     STOP taking these medications    cefadroxil 500 MG capsule Commonly known as: DURICEF   pravastatin 40 MG tablet Commonly known as: PRAVACHOL       TAKE these medications    amLODipine 5 MG tablet Commonly known as: NORVASC TAKE ONE TABLET BY MOUTH ONCE DAILY.   aspirin EC 81 MG tablet Take 1 tablet (81 mg total) by mouth daily.   buPROPion 150 MG 12 hr tablet Commonly known as: WELLBUTRIN SR Take 150 mg by mouth 2 (two) times daily.   cetirizine 10 MG tablet Commonly known as: ZYRTEC Take 10 mg by mouth daily.   clopidogrel 75 MG tablet Commonly known as: PLAVIX Take 1 tablet (75 mg total) by mouth daily for 21 days.   Gemtesa 75 MG Tabs Generic drug: Vibegron Take 75 mg by mouth daily.   HYDROcodone-acetaminophen 5-325 MG tablet Commonly known as: NORCO/VICODIN One tablet every four hours for pain.   lisinopril 40 MG tablet Commonly known as: ZESTRIL Take 40 mg by  Physician Discharge Summary  [name]:[id number] DOB: 1945/07/27 DOA: 04/21/2022  PCP: Celene Squibb, MD  Admit date: 04/21/2022  Discharge date: 04/23/2022  Admitted From:Home  Disposition:  Home  Recommendations for Outpatient Follow-up:  Follow up with PCP in 1-2 weeks Follow-up with ENT and vascular surgery as recommended for carotid mass evaluation Follow-up with neurology Dr. Merlene Laughter as recommended in 3 months for further evaluation of CVA Remain on aspirin and Plavix as prescribed for 3 weeks and then aspirin only thereafter Can continue on Crestor 30-day Holter monitor to be sent at home for evaluation of arrhythmias  Home Health: None  Equipment/Devices: None  Discharge Condition:Stable  CODE STATUS: DNR  Diet recommendation: Heart Healthy  Brief/Interim Summary: [name] is a 76 y.o. male with medical history significant of coronary artery disease, depression, hypertension, GERD, hyperlipidemia, and more presents the ED with a chief complaint of altered mental status and ataxia.  Patient reports that on Friday he could not get dressed, and could not talk.  He had an EMT next-door who checked his blood pressure and it was extremely high he said it was in the 200s over 100s.  Patient was confused.  Patient was admitted for acute encephalopathy associated with ataxia and concern for CVA.  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  Neurology recommending aspirin and Plavix for 3 weeks followed by aspirin daily as well as rosuvastatin daily.  2D echocardiogram without any significant findings and PT with no recommendations at home.  ENT and vascular referral arranged for carotid mass incidentally noted on head and neck CT imaging.  He is in stable condition for discharge today.  Discharge Diagnoses:  Principal Problem:   Ataxia Active Problems:   Mixed hyperlipidemia   Acute metabolic encephalopathy  Principal discharge  diagnosis: Subacute ischemic CVA with hypertensive emergency.  Discharge Instructions  Discharge Instructions     Ambulatory referral to ENT   Complete by: As directed    Ambulatory referral to Vascular Surgery   Complete by: As directed    Diet - low sodium heart healthy   Complete by: As directed    Increase activity slowly   Complete by: As directed       Allergies as of 04/23/2022       Reactions   Simvastatin Rash   Other reaction(s): Muscle Pain, Other (See Comments)        Medication List     STOP taking these medications    cefadroxil 500 MG capsule Commonly known as: DURICEF   pravastatin 40 MG tablet Commonly known as: PRAVACHOL       TAKE these medications    amLODipine 5 MG tablet Commonly known as: NORVASC TAKE ONE TABLET BY MOUTH ONCE DAILY.   aspirin EC 81 MG tablet Take 1 tablet (81 mg total) by mouth daily.   buPROPion 150 MG 12 hr tablet Commonly known as: WELLBUTRIN SR Take 150 mg by mouth 2 (two) times daily.   cetirizine 10 MG tablet Commonly known as: ZYRTEC Take 10 mg by mouth daily.   clopidogrel 75 MG tablet Commonly known as: PLAVIX Take 1 tablet (75 mg total) by mouth daily for 21 days.   Gemtesa 75 MG Tabs Generic drug: Vibegron Take 75 mg by mouth daily.   HYDROcodone-acetaminophen 5-325 MG tablet Commonly known as: NORCO/VICODIN One tablet every four hours for pain.   lisinopril 40 MG tablet Commonly known as: ZESTRIL Take 40 mg by  mouth daily.   metFORMIN 500 MG tablet Commonly known as: GLUCOPHAGE Take 500 mg by mouth 2 (two) times daily with a meal.   metoprolol succinate 25 MG 24 hr tablet Commonly known as: Toprol XL Take 1 tablet (25 mg total) by mouth daily.   multivitamin with minerals Tabs tablet Take 1 tablet by mouth daily.   omeprazole 20 MG capsule Commonly known as: PRILOSEC Take 1 capsule by mouth daily.   ondansetron 4 MG disintegrating tablet Commonly known as: ZOFRAN-ODT '4mg'$  ODT q4  hours prn nausea/vomit   pantoprazole 20 MG tablet Commonly known as: PROTONIX Take 1 tablet (20 mg total) by mouth daily.   rosuvastatin 10 MG tablet Commonly known as: CRESTOR TAKE ONE TABLET BY MOUTH ONCE DAILY.   sertraline 100 MG tablet Commonly known as: ZOLOFT Take 1 tablet by mouth 2 (two) times daily.        Follow-up Information     Phillips Odor, MD. Schedule an appointment as soon as possible for a visit in 3 month(s).   Specialty: Neurology Contact information: [address] [address] [phone]         Leta Baptist, MD. Go in 1 week(s).   Specialty: Otolaryngology Contact information: [address] [place] [address] [phone]         Celene Squibb, MD. Schedule an appointment as soon as possible for a visit in 2 week(s).   Specialty: Internal Medicine Contact information: [place] [facility] [address] [phone]         Rosetta Posner, MD. Go in 1 week(s).   Specialties: Vascular Surgery, Cardiology Contact information: [place] [address] [phone]                Allergies  Allergen Reactions   Simvastatin Rash    Other reaction(s): Muscle Pain, Other (See Comments)    Consultations: Neurology   Procedures/Studies: MR BRAIN W CONTRAST  Result Date: 04/22/2022 CLINICAL DATA:  Stroke suspected. EXAM: MRI HEAD WITH CONTRAST TECHNIQUE: Multiplanar, multiecho pulse sequences of the brain and surrounding structures were obtained with intravenous contrast. COMPARISON:  MRI of the brain without contrast April 21, 2022. FINDINGS: Small lacunar infarcts are seen in the bilateral centrum semiovale. These are best described on recent noncontrast MRI. Postcontrast images show no evidence of abnormal contrast enhancement. IMPRESSION: No abnormal contrast enhancement. Electronically Signed   By: Pedro Earls M.D.   On: 04/22/2022 13:40   ECHOCARDIOGRAM COMPLETE  Result  Date: 04/22/2022    ECHOCARDIOGRAM REPORT   Patient Name:   [name] Date of Exam: 04/22/2022 Medical Rec #:  [id number]     Height:       65.0 in Accession #:    [id number]    Weight:       194.1 lb Date of Birth:  02-11-1946      BSA:          1.953 m Patient Age:    76 years      BP:           148/73 mmHg Patient Gender: M             HR:           63 bpm. Exam Location:  [facility] Procedure: 2D Echo, Cardiac Doppler and Color Doppler Indications:    Stroke  History:        Patient has no prior history of Echocardiogram

## 2022-04-23 NOTE — Plan of Care (Signed)
  Problem: Education: Goal: Knowledge of General Education information will improve Description Including pain rating scale, medication(s)/side effects and non-pharmacologic comfort measures Outcome: Progressing   Problem: Health Behavior/Discharge Planning: Goal: Ability to manage health-related needs will improve Outcome: Progressing   

## 2022-04-23 NOTE — Progress Notes (Signed)
OT Cancellation Note  Patient Details Name: CURLEE BOGAN MRN: [id number] DOB: September 10, 1945   Cancelled Treatment:    Reason Eval/Treat Not Completed: OT screened, no needs identified, will sign off. Pt demonstrated good B UE strength with reported independence with ADL's and no vision deficits. Able to transfer around room without AD.   Kyandra Mcclaine OT, MOT   Larey Seat 04/23/2022, 8:48 AM

## 2022-04-30 ENCOUNTER — Encounter: Payer: Self-pay | Admitting: Vascular Surgery

## 2022-04-30 ENCOUNTER — Ambulatory Visit (INDEPENDENT_AMBULATORY_CARE_PROVIDER_SITE_OTHER): Payer: No Typology Code available for payment source | Admitting: Vascular Surgery

## 2022-04-30 VITALS — BP 136/85 | HR 73 | Temp 98.6°F | Ht 65.0 in | Wt 202.0 lb

## 2022-04-30 DIAGNOSIS — I10 Essential (primary) hypertension: Secondary | ICD-10-CM | POA: Diagnosis not present

## 2022-04-30 DIAGNOSIS — R5383 Other fatigue: Secondary | ICD-10-CM | POA: Diagnosis not present

## 2022-04-30 DIAGNOSIS — I700 Atherosclerosis of aorta: Secondary | ICD-10-CM | POA: Diagnosis not present

## 2022-04-30 DIAGNOSIS — Z8673 Personal history of transient ischemic attack (TIA), and cerebral infarction without residual deficits: Secondary | ICD-10-CM | POA: Diagnosis not present

## 2022-04-30 DIAGNOSIS — E782 Mixed hyperlipidemia: Secondary | ICD-10-CM | POA: Diagnosis not present

## 2022-04-30 DIAGNOSIS — D446 Neoplasm of uncertain behavior of carotid body: Secondary | ICD-10-CM

## 2022-04-30 DIAGNOSIS — E118 Type 2 diabetes mellitus with unspecified complications: Secondary | ICD-10-CM | POA: Diagnosis not present

## 2022-04-30 DIAGNOSIS — Z634 Disappearance and death of family member: Secondary | ICD-10-CM | POA: Diagnosis not present

## 2022-04-30 NOTE — Progress Notes (Signed)
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 male, who is today for discussion of recent CTA AT [facility] revealing carotid body tumor.  He had an episode of headache and confusion.  He reports that his neighbor is a paramedic and found that his blood pressure was in excess of 200/100.  He presented to the emergency room.  He does not recall any specific focal weakness but apparently did have weakness on the right side.  Evaluation in the hospital revealed MRI and CT evidence of new and old right brain stroke.  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  He is here today for further discussion of this.  Past Medical History:  Diagnosis Date   CAD (coronary artery disease)    Depression    Essential hypertension    GERD (gastroesophageal reflux disease)    Mixed hyperlipidemia    PTSD (post-traumatic stress disorder)     Family History  Problem Relation Age of Onset   CVA Father        Cerebral hemorrhage    SOCIAL HISTORY: Social History   Socioeconomic History   Marital status: Married    Spouse name: Not on file   Number of children: Not on file   Years of education: Not on file   Highest education level: Not on file  Occupational History   Not on file  Tobacco Use   Smoking status: Former    Packs/day: 1.00    Years: 2.00    Total pack years: 2.00    Types: Cigarettes    Quit date: 56    Years since quitting: 48.8   Smokeless tobacco: Never  Vaping Use   Vaping Use: Never used  Substance and Sexual Activity   Alcohol use: Never   Drug use: Never   Sexual activity: Not on file  Other Topics Concern   Not on file  Social History Narrative   Right handed   Lives with wife in one story home   Social Determinants of Health    Financial Resource Strain: Not on file  Food Insecurity: No Food Insecurity (04/22/2022)   Hunger Vital Sign    Worried About Running Out of Food in the Last Year: Never true    Ran Out of Food in the Last Year: Never true  Transportation Needs: No Transportation Needs (04/22/2022)   PRAPARE - Administrator, Civil Service (Medical): No    Lack of Transportation (Non-Medical): No  Physical Activity: Not on file  Stress: Not on file  Social Connections: Not on file  Intimate Partner Violence: Not At Risk (04/22/2022)   Humiliation, Afraid, Rape, and Kick questionnaire    Fear of Current or Ex-Partner: No    Emotionally Abused: No    Physically Abused: No    Sexually Abused: No    Allergies  Allergen Reactions   Simvastatin Rash    Other reaction(s): Muscle Pain, Other (See Comments)    Current Outpatient Medications  Medication Sig Dispense Refill   clopidogrel (PLAVIX) 75 MG tablet Take 1 tablet (75 mg total) by mouth daily for 21 days. 21 tablet 0   lisinopril (ZESTRIL) 40 MG tablet Take 40 mg by mouth daily.     metFORMIN (GLUCOPHAGE) 500 MG tablet Take 500 mg by mouth 2 (  two) times daily with a meal.     rosuvastatin (CRESTOR) 10 MG tablet TAKE ONE TABLET BY MOUTH ONCE DAILY. 90 tablet 3   amLODipine (NORVASC) 5 MG tablet TAKE ONE TABLET BY MOUTH ONCE DAILY. (Patient not taking: Reported on 04/21/2022) 90 tablet 3   aspirin EC 81 MG tablet Take 1 tablet (81 mg total) by mouth daily. (Patient not taking: Reported on 04/21/2022) 90 tablet 3   buPROPion (WELLBUTRIN SR) 150 MG 12 hr tablet Take 150 mg by mouth 2 (two) times daily. (Patient not taking: Reported on 04/30/2022)     cetirizine (ZYRTEC) 10 MG tablet Take 10 mg by mouth daily. (Patient not taking: Reported on 04/30/2022)     HYDROcodone-acetaminophen (NORCO/VICODIN) 5-325 MG tablet One tablet every four hours for pain. (Patient not taking: Reported on 04/30/2022) 30 tablet 0   metoprolol succinate (TOPROL XL)  25 MG 24 hr tablet Take 1 tablet (25 mg total) by mouth daily. (Patient not taking: Reported on 04/21/2022) 90 tablet 3   Multiple Vitamin (MULTIVITAMIN WITH MINERALS) TABS tablet Take 1 tablet by mouth daily. (Patient not taking: Reported on 04/30/2022)     omeprazole (PRILOSEC) 20 MG capsule Take 1 capsule by mouth daily. (Patient not taking: Reported on 04/30/2022)     ondansetron (ZOFRAN-ODT) 4 MG disintegrating tablet 4mg  ODT q4 hours prn nausea/vomit (Patient not taking: Reported on 04/21/2022) 10 tablet 0   pantoprazole (PROTONIX) 20 MG tablet Take 1 tablet (20 mg total) by mouth daily. (Patient not taking: Reported on 04/21/2022) 20 tablet 0   sertraline (ZOLOFT) 100 MG tablet Take 1 tablet by mouth 2 (two) times daily. (Patient not taking: Reported on 04/30/2022)     Vibegron (GEMTESA) 75 MG TABS Take 75 mg by mouth daily. (Patient not taking: Reported on 04/21/2022) 30 tablet 11   No current facility-administered medications for this visit.    REVIEW OF SYSTEMS:  [X]  denotes positive finding, [ ]  denotes negative finding Cardiac  Comments:  Chest pain or chest pressure:    Shortness of breath upon exertion:    Short of breath when lying flat:    Irregular heart rhythm:        Vascular    Pain in calf, thigh, or hip brought on by ambulation:    Pain in feet at night that wakes you up from your sleep:     Blood clot in your veins: x   Leg swelling:         Pulmonary    Oxygen at home:    Productive cough:     Wheezing:         Neurologic    Sudden weakness in arms or legs:     Sudden numbness in arms or legs:     Sudden onset of difficulty speaking or slurred speech:    Temporary loss of vision in one eye:     Problems with dizziness:         Gastrointestinal    Blood in stool:     Vomited blood:         Genitourinary    Burning when urinating:     Blood in urine:        Psychiatric    Major depression:         Hematologic    Bleeding problems:    Problems with  blood clotting too easily:        Skin    Rashes or ulcers:  Constitutional    Fever or chills:      PHYSICAL EXAM: Vitals:   04/30/22 1127 04/30/22 1129  BP: 125/79 136/85  Pulse: 73   Temp: 98.6 F (37 C)   SpO2: 95%   Weight: 202 lb (91.6 kg)   Height: 5\' 5"  (1.651 m)     GENERAL: The patient is a well-nourished male, in no acute distress. The vital signs are documented above. CARDIOVASCULAR: Carotid arteries without bruits bilaterally.  I do not feel a mass in his right carotid body. PULMONARY: There is good air exchange  MUSCULOSKELETAL: There are no major deformities or cyanosis. NEUROLOGIC: No focal weakness or paresthesias are detected. SKIN: There are no ulcers or rashes noted. PSYCHIATRIC: The patient has a normal affect.  DATA:  CT angiogram images were reviewed with the patient.  This this does show a less than 1-1/2 cm carotid body tumor.  It does not appear to encase the carotid arteries.  MEDICAL ISSUES: I discussed the significance of this at length with the patient.  In looking at his old records, he did have this carotid body tumor in his CT scan from 2015.  At that time maximal size was 12 mm.  There is been minimal change since that time.  He also reports that this has been followed at the [facility] and at one point was being seen with ultrasound at 29-month intervals.  He reports that that practitioner moves on and the different practitioners that he should have it imaged every 5 years.  Certainly do not think there is any indication for treatment.  He has nearly 10-year documentation of no change.  I would recommend ultrasound in 2 years to rule out any changes well.  He was referred to ear nose and throat as well.  I feel that this is not necessary since I feel it would be very unlikely that there would be any different recommendation ENT.  We will see him again in 2 years for continued follow-up   Larina Earthly, MD [facility] Vascular and Vein  Specialists of [facility] Tel [phone] Pager [phone]  Note: Portions of this report may have been transcribed using voice recognition software.  Every effort has been made to ensure accuracy; however, inadvertent computerized transcription errors may still be present.

## 2022-05-07 DIAGNOSIS — I10 Essential (primary) hypertension: Secondary | ICD-10-CM | POA: Diagnosis not present

## 2022-05-14 DIAGNOSIS — H6123 Impacted cerumen, bilateral: Secondary | ICD-10-CM | POA: Diagnosis not present

## 2022-05-19 DIAGNOSIS — M25572 Pain in left ankle and joints of left foot: Secondary | ICD-10-CM | POA: Diagnosis not present

## 2022-05-19 DIAGNOSIS — S8292XS Unspecified fracture of left lower leg, sequela: Secondary | ICD-10-CM | POA: Diagnosis not present

## 2022-05-19 DIAGNOSIS — M19172 Post-traumatic osteoarthritis, left ankle and foot: Secondary | ICD-10-CM | POA: Diagnosis not present

## 2022-05-23 DIAGNOSIS — E782 Mixed hyperlipidemia: Secondary | ICD-10-CM | POA: Diagnosis not present

## 2022-05-23 DIAGNOSIS — E039 Hypothyroidism, unspecified: Secondary | ICD-10-CM | POA: Diagnosis not present

## 2022-05-23 DIAGNOSIS — E1169 Type 2 diabetes mellitus with other specified complication: Secondary | ICD-10-CM | POA: Diagnosis not present

## 2022-05-30 DIAGNOSIS — I10 Essential (primary) hypertension: Secondary | ICD-10-CM | POA: Diagnosis not present

## 2022-05-30 DIAGNOSIS — E782 Mixed hyperlipidemia: Secondary | ICD-10-CM | POA: Diagnosis not present

## 2022-05-30 DIAGNOSIS — D649 Anemia, unspecified: Secondary | ICD-10-CM | POA: Diagnosis not present

## 2022-05-30 DIAGNOSIS — K219 Gastro-esophageal reflux disease without esophagitis: Secondary | ICD-10-CM | POA: Diagnosis not present

## 2022-05-30 DIAGNOSIS — Z634 Disappearance and death of family member: Secondary | ICD-10-CM | POA: Diagnosis not present

## 2022-05-30 DIAGNOSIS — E039 Hypothyroidism, unspecified: Secondary | ICD-10-CM | POA: Diagnosis not present

## 2022-05-30 DIAGNOSIS — I700 Atherosclerosis of aorta: Secondary | ICD-10-CM | POA: Diagnosis not present

## 2022-05-30 DIAGNOSIS — E1169 Type 2 diabetes mellitus with other specified complication: Secondary | ICD-10-CM | POA: Diagnosis not present

## 2022-05-30 DIAGNOSIS — E559 Vitamin D deficiency, unspecified: Secondary | ICD-10-CM | POA: Diagnosis not present

## 2022-05-30 DIAGNOSIS — M25551 Pain in right hip: Secondary | ICD-10-CM | POA: Diagnosis not present

## 2022-07-04 DIAGNOSIS — E782 Mixed hyperlipidemia: Secondary | ICD-10-CM | POA: Diagnosis not present

## 2022-07-04 DIAGNOSIS — I25119 Atherosclerotic heart disease of native coronary artery with unspecified angina pectoris: Secondary | ICD-10-CM | POA: Diagnosis not present

## 2022-07-04 DIAGNOSIS — Z01818 Encounter for other preprocedural examination: Secondary | ICD-10-CM | POA: Diagnosis not present

## 2022-07-04 DIAGNOSIS — I10 Essential (primary) hypertension: Secondary | ICD-10-CM | POA: Diagnosis not present

## 2022-07-04 DIAGNOSIS — R7301 Impaired fasting glucose: Secondary | ICD-10-CM | POA: Diagnosis not present

## 2022-07-04 DIAGNOSIS — E039 Hypothyroidism, unspecified: Secondary | ICD-10-CM | POA: Diagnosis not present

## 2022-07-04 DIAGNOSIS — Z9182 Personal history of military deployment: Secondary | ICD-10-CM | POA: Diagnosis not present

## 2022-07-04 DIAGNOSIS — F32A Depression, unspecified: Secondary | ICD-10-CM | POA: Diagnosis not present

## 2022-07-04 DIAGNOSIS — K219 Gastro-esophageal reflux disease without esophagitis: Secondary | ICD-10-CM | POA: Diagnosis not present

## 2022-07-11 ENCOUNTER — Emergency Department (HOSPITAL_COMMUNITY): Payer: No Typology Code available for payment source

## 2022-07-11 ENCOUNTER — Encounter (HOSPITAL_COMMUNITY): Payer: Self-pay

## 2022-07-11 ENCOUNTER — Emergency Department (HOSPITAL_COMMUNITY)
Admission: EM | Admit: 2022-07-11 | Discharge: 2022-07-11 | Disposition: A | Discharge status: Home / Self Care | Payer: No Typology Code available for payment source | Attending: Emergency Medicine | Admitting: Emergency Medicine

## 2022-07-11 DIAGNOSIS — I2510 Atherosclerotic heart disease of native coronary artery without angina pectoris: Secondary | ICD-10-CM | POA: Diagnosis not present

## 2022-07-11 DIAGNOSIS — R197 Diarrhea, unspecified: Secondary | ICD-10-CM | POA: Diagnosis present

## 2022-07-11 DIAGNOSIS — I10 Essential (primary) hypertension: Secondary | ICD-10-CM | POA: Diagnosis not present

## 2022-07-11 DIAGNOSIS — Z7982 Long term (current) use of aspirin: Secondary | ICD-10-CM | POA: Insufficient documentation

## 2022-07-11 DIAGNOSIS — Z79899 Other long term (current) drug therapy: Secondary | ICD-10-CM | POA: Diagnosis not present

## 2022-07-11 LAB — COMPREHENSIVE METABOLIC PANEL
ALT: 20 U/L (ref 0–44)
AST: 26 U/L (ref 15–41)
Albumin: 4.1 g/dL (ref 3.5–5.0)
Alkaline Phosphatase: 64 U/L (ref 38–126)
Anion gap: 8 (ref 5–15)
BUN: 10 mg/dL (ref 8–23)
CO2: 26 mmol/L (ref 22–32)
Calcium: 9.1 mg/dL (ref 8.9–10.3)
Chloride: 107 mmol/L (ref 98–111)
Creatinine, Ser: 1.06 mg/dL (ref 0.61–1.24)
GFR, Estimated: >60 mL/min (ref >60)
Glucose, Bld: 132 mg/dL — ABNORMAL HIGH (ref 70–99)
Potassium: 3.9 mmol/L (ref 3.5–5.1)
Sodium: 141 mmol/L (ref 135–145)
Total Bilirubin: 0.6 mg/dL (ref 0.3–1.2)
Total Protein: 6.9 g/dL (ref 6.5–8.1)

## 2022-07-11 LAB — URINALYSIS, ROUTINE W REFLEX MICROSCOPIC
Bacteria, UA: NONE SEEN
Bilirubin Urine: NEGATIVE
Glucose, UA: NEGATIVE mg/dL
Hgb urine dipstick: NEGATIVE
Ketones, ur: 5 mg/dL — AB
Nitrite: NEGATIVE
Protein, ur: 30 mg/dL — AB
Specific Gravity, Urine: 1.025 (ref 1.005–1.030)
pH: 5 (ref 5.0–8.0)

## 2022-07-11 LAB — CBC
HCT: 42.6 % (ref 39.0–52.0)
Hemoglobin: 14 g/dL (ref 13.0–17.0)
MCH: 29 pg (ref 26.0–34.0)
MCHC: 32.9 g/dL (ref 30.0–36.0)
MCV: 88.4 fL (ref 80.0–100.0)
Platelets: 209 10*3/uL (ref 150–400)
RBC: 4.82 MIL/uL (ref 4.22–5.81)
RDW: 12 % (ref 11.5–15.5)
WBC: 6.2 10*3/uL (ref 4.0–10.5)
nRBC: 0 % (ref 0.0–0.2)

## 2022-07-11 LAB — LIPASE, BLOOD: Lipase: 27 U/L (ref 11–51)

## 2022-07-11 MED ORDER — LOPERAMIDE HCL 2 MG PO CAPS: 2.0000 mg | ORAL_CAPSULE | Freq: Four times a day (QID) | ORAL | 0 refills | Status: DC | PRN

## 2022-07-11 MED ORDER — IOHEXOL 300 MG/ML  SOLN
100.0000 mL | Freq: Once | INTRAMUSCULAR | Status: AC | PRN
  Administered 2022-07-11: 100 mL via INTRAVENOUS

## 2022-07-11 NOTE — ED Triage Notes (Signed)
Pt states he was recently dx with diverticulitis. Pt states he has had diarrhea all week. Pt states the [place] sent him here for CT. Denies pain

## 2022-07-11 NOTE — ED Provider Triage Note (Signed)
Emergency Medicine Provider Triage Evaluation Note  MAVERIK FOOT , a 76 y.o. male  was evaluated in triage.  Pt complains of left lower abdominal pain.  Pt reports he has had diverticultis in the past.    Review of Systems  Positive: Abdominal pain and diarrhea  Negative: fever  Physical Exam  BP (!) 144/90 (BP Location: Right Arm)   Pulse 85   Temp 98.1 F (36.7 C) (Oral)   Resp 16   Ht '5\' 5"'$  (1.651 m)   Wt 88.9 kg   SpO2 99%   BMI 32.62 kg/m  Gen:   Awake, no distress   Resp:  Normal effort  MSK:   Moves extremities without difficulty  Other:    Medical Decision Making  Medically screening exam initiated at 4:00 PM.  Appropriate orders placed.  Justin Mend Campisi was informed that the remainder of the evaluation will be completed by another provider, this initial triage assessment does not replace that evaluation, and the importance of remaining in the ED until their evaluation is complete.     Fransico Meadow, [place] 07/11/22 1605

## 2022-07-11 NOTE — Discharge Instructions (Addendum)
Your blood tests and CT scan today were reassuring.  The CAT scan did not show any evidence of diverticulitis.  Take the medications as needed to help with your loose stools.  Follow up with the GI doctor for further evaluation.

## 2022-07-11 NOTE — ED Provider Notes (Signed)
[facility] EMERGENCY DEPARTMENT Provider Note   CSN: [id number] Arrival date & time: 07/11/22  1505     History  Chief Complaint  Patient presents with   Diarrhea    [name] is a 76 y.o. male.   Diarrhea    Patient has a history of reflux, hypertension, depression, coronary artery disease.  Patient states he has been having some trouble with intermittent loose stools.  Patient states he has been having increasing frequency in the last week with a couple times per day.  Patient states he feels like he will have some gas moving in his lower abdomen.  He did have some discomfort in his left lower abdomen earlier.  Patient states he also has had some episodes of fecal incontinence associated with loose stool.  Home Medications Prior to Admission medications   Medication Sig Start Date End Date Taking? Authorizing Provider  loperamide (IMODIUM) 2 MG capsule Take 1 capsule (2 mg total) by mouth 4 (four) times daily as needed for diarrhea or loose stools. 07/11/22  Yes Dorie Rank, MD  loperamide (IMODIUM) 2 MG capsule Take 1 capsule (2 mg total) by mouth 4 (four) times daily as needed for diarrhea or loose stools. 07/11/22  Yes Dorie Rank, MD  amLODipine (NORVASC) 5 MG tablet TAKE ONE TABLET BY MOUTH ONCE DAILY. Patient not taking: Reported on 04/21/2022 09/04/20   Satira Sark, MD  aspirin EC 81 MG tablet Take 1 tablet (81 mg total) by mouth daily. Patient not taking: Reported on 04/21/2022 08/16/19   Satira Sark, MD  buPROPion [facility] SR) 150 MG 12 hr tablet Take 150 mg by mouth 2 (two) times daily. Patient not taking: Reported on 04/30/2022    [provider]  cetirizine (ZYRTEC) 10 MG tablet Take 10 mg by mouth daily. Patient not taking: Reported on 04/30/2022    [provider]  HYDROcodone-acetaminophen (NORCO/VICODIN) 5-325 MG tablet One tablet every four hours for pain. Patient not taking: Reported on 04/30/2022 03/19/22   Sanjuana Kava, MD   lisinopril (ZESTRIL) 40 MG tablet Take 40 mg by mouth daily.    [provider]  metFORMIN (GLUCOPHAGE) 500 MG tablet Take 500 mg by mouth 2 (two) times daily with a meal. 01/14/21   [provider]  metoprolol succinate (TOPROL XL) 25 MG 24 hr tablet Take 1 tablet (25 mg total) by mouth daily. Patient not taking: Reported on 04/21/2022 08/16/19   Satira Sark, MD  Multiple Vitamin (MULTIVITAMIN WITH MINERALS) TABS tablet Take 1 tablet by mouth daily. Patient not taking: Reported on 04/30/2022    [provider]  omeprazole (PRILOSEC) 20 MG capsule Take 1 capsule by mouth daily. Patient not taking: Reported on 04/30/2022 10/04/20   [provider]  ondansetron (ZOFRAN-ODT) 4 MG disintegrating tablet '4mg'$  ODT q4 hours prn nausea/vomit Patient not taking: Reported on 04/21/2022 11/14/21   Milton Ferguson, MD  pantoprazole (PROTONIX) 20 MG tablet Take 1 tablet (20 mg total) by mouth daily. Patient not taking: Reported on 04/21/2022 11/14/21   Milton Ferguson, MD  rosuvastatin (CRESTOR) 10 MG tablet TAKE ONE TABLET BY MOUTH ONCE DAILY. 12/21/20   Satira Sark, MD  sertraline (ZOLOFT) 100 MG tablet Take 1 tablet by mouth 2 (two) times daily. Patient not taking: Reported on 04/30/2022 09/05/21   [provider]  Vibegron (GEMTESA) 75 MG TABS Take 75 mg by mouth daily. Patient not taking: Reported on 04/21/2022 06/17/21   Stoneking, Reece Leader., MD  Allergies    Simvastatin    Review of Systems   Review of Systems  Gastrointestinal:  Positive for diarrhea.    Physical Exam Updated Vital Signs BP (!) 149/69   Pulse 75   Temp 98.1 F (36.7 C) (Oral)   Resp 17   Ht 1.651 m ('5\' 5"'$ )   Wt 88.9 kg   SpO2 99%   BMI 32.62 kg/m  Physical Exam Vitals and nursing note reviewed.  Constitutional:      General: He is not in acute distress.    Appearance: He is well-developed.  HENT:     Head: Normocephalic and atraumatic.     Right Ear: External  ear normal.     Left Ear: External ear normal.  Eyes:     General: No scleral icterus.       Right eye: No discharge.        Left eye: No discharge.     Conjunctiva/sclera: Conjunctivae normal.  Neck:     Trachea: No tracheal deviation.  Cardiovascular:     Rate and Rhythm: Normal rate and regular rhythm.  Pulmonary:     Effort: Pulmonary effort is normal. No respiratory distress.     Breath sounds: Normal breath sounds. No stridor. No wheezing or rales.  Abdominal:     General: Bowel sounds are normal. There is no distension.     Palpations: Abdomen is soft.     Tenderness: There is no abdominal tenderness. There is no guarding or rebound.  Musculoskeletal:        General: No tenderness or deformity.     Cervical back: Neck supple.  Skin:    General: Skin is warm and dry.     Findings: No rash.  Neurological:     General: No focal deficit present.     Mental Status: He is alert.     Cranial Nerves: No cranial nerve deficit, dysarthria or facial asymmetry.     Sensory: No sensory deficit.     Motor: No abnormal muscle tone or seizure activity.     Coordination: Coordination normal.  Psychiatric:        Mood and Affect: Mood normal.     ED Results / Procedures / Treatments   Labs (all labs ordered are listed, but only abnormal results are displayed) Labs Reviewed  COMPREHENSIVE METABOLIC PANEL - Abnormal; Notable for the following components:      Result Value   Glucose, Bld 132 (*)    All other components within normal limits  URINALYSIS, ROUTINE W REFLEX MICROSCOPIC - Abnormal; Notable for the following components:   APPearance HAZY (*)    Ketones, ur 5 (*)    Protein, ur 30 (*)    Leukocytes,Ua LARGE (*)    All other components within normal limits  LIPASE, BLOOD  CBC    EKG None  Radiology CT ABDOMEN PELVIS W CONTRAST  Result Date: 07/11/2022 CLINICAL DATA:  Left lower quadrant abdominal pain, diarrhea for 1 month EXAM: CT ABDOMEN AND PELVIS WITH  CONTRAST TECHNIQUE: Multidetector CT imaging of the abdomen and pelvis was performed using the standard protocol following bolus administration of intravenous contrast. RADIATION DOSE REDUCTION: This exam was performed according to the departmental dose-optimization program which includes automated exposure control, adjustment of the mA and/or kV according to patient size and/or use of iterative reconstruction technique. CONTRAST:  160m OMNIPAQUE IOHEXOL 300 MG/ML  SOLN COMPARISON:  04/18/2022 FINDINGS: Lower chest: No acute pleural or parenchymal lung disease. Hepatobiliary: No focal  liver abnormality is seen. No gallstones, gallbladder wall thickening, or biliary dilatation. Pancreas: Unremarkable. No pancreatic ductal dilatation or surrounding inflammatory changes. Spleen: Normal in size without focal abnormality. Adrenals/Urinary Tract: Nonobstructing 8 mm right renal calculus unchanged. No left-sided calculi. The kidneys enhance normally and symmetrically. The adrenals and bladder are unremarkable. Stomach/Bowel: No bowel obstruction or ileus. Normal appendix right lower quadrant. Diffuse colonic diverticulosis without evidence of diverticulitis. No bowel wall thickening or inflammatory change. Vascular/Lymphatic: Aortic atherosclerosis. No enlarged abdominal or pelvic lymph nodes. Reproductive: Prostate is unremarkable. Other: No free fluid or free intraperitoneal gas. No abdominal wall hernia. Musculoskeletal: No acute or destructive bony lesions. Reconstructed images demonstrate no additional findings. IMPRESSION: 1. Diffuse colonic diverticulosis without evidence of diverticulitis. 2. Nonobstructing 8 mm right renal calculus. 3.  Aortic Atherosclerosis (ICD10-I70.0). Electronically Signed   By: Randa Ngo M.D.   On: 07/11/2022 18:30    Procedures Procedures    Medications Ordered in ED Medications  iohexol (OMNIPAQUE) 300 MG/ML solution 100 mL (100 mLs Intravenous Contrast Given 07/11/22 1756)     ED Course/ Medical Decision Making/ A&P Clinical Course as of 07/11/22 1857  Fri Jul 11, 2022  1837 Urinalysis, Routine w reflex microscopic(!) Urinalysis does show large leukocyte esterase and white blood cells [JK]  1838 Comprehensive metabolic panel(!) Metabolic panel normal [JK]  1838 CBC CBC normal [JK]  1838 Lipase, blood Lipase normal [JK]  1838 CT scan shows evidence of diverticulosis without diverticulitis.  Nonobstructing renal stone noted [JK]    Clinical Course User Index [JK] Dorie Rank, MD                           Medical Decision Making Differential diagnosis includes but not limited to colitis, diverticulitis, viral illness, encopresis  Amount and/or Complexity of Data Reviewed Labs: ordered. Decision-making details documented in ED Course.  Risk Prescription drug management.   Patient's ED workup is reassuring.  Labs are otherwise unremarkable with the exception of his urinalysis however he is not having any urinary symptoms.  Doubt UTI..  CT scan does not show any signs of diverticulitis.  Incidental atherosclerosis and kidney stone noted.  Will try him on a course of Imodium.  Outpatient follow-up with GI and his PCP.        Final Clinical Impression(s) / ED Diagnoses Final diagnoses:  Diarrhea, unspecified type    Rx / DC Orders ED Discharge Orders          Ordered    loperamide (IMODIUM) 2 MG capsule  4 times daily PRN        07/11/22 1850    loperamide (IMODIUM) 2 MG capsule  4 times daily PRN        07/11/22 1854              Dorie Rank, MD 07/11/22 1857

## 2022-07-18 ENCOUNTER — Ambulatory Visit (INDEPENDENT_AMBULATORY_CARE_PROVIDER_SITE_OTHER): Payer: Medicare Other | Admitting: Gastroenterology

## 2022-07-18 ENCOUNTER — Encounter: Payer: Self-pay | Admitting: Gastroenterology

## 2022-07-18 VITALS — BP 136/81 | HR 64 | Temp 97.6°F | Ht 65.0 in | Wt 196.2 lb

## 2022-07-18 DIAGNOSIS — E118 Type 2 diabetes mellitus with unspecified complications: Secondary | ICD-10-CM | POA: Diagnosis not present

## 2022-07-18 DIAGNOSIS — R197 Diarrhea, unspecified: Secondary | ICD-10-CM | POA: Insufficient documentation

## 2022-07-18 DIAGNOSIS — K219 Gastro-esophageal reflux disease without esophagitis: Secondary | ICD-10-CM | POA: Diagnosis not present

## 2022-07-18 DIAGNOSIS — A09 Infectious gastroenteritis and colitis, unspecified: Secondary | ICD-10-CM | POA: Diagnosis not present

## 2022-07-18 DIAGNOSIS — K2270 Barrett's esophagus without dysplasia: Secondary | ICD-10-CM | POA: Insufficient documentation

## 2022-07-18 DIAGNOSIS — Z713 Dietary counseling and surveillance: Secondary | ICD-10-CM | POA: Diagnosis not present

## 2022-07-18 DIAGNOSIS — D446 Neoplasm of uncertain behavior of carotid body: Secondary | ICD-10-CM | POA: Diagnosis not present

## 2022-07-18 DIAGNOSIS — M19079 Primary osteoarthritis, unspecified ankle and foot: Secondary | ICD-10-CM | POA: Diagnosis not present

## 2022-07-18 DIAGNOSIS — I10 Essential (primary) hypertension: Secondary | ICD-10-CM | POA: Diagnosis not present

## 2022-07-18 DIAGNOSIS — I700 Atherosclerosis of aorta: Secondary | ICD-10-CM | POA: Diagnosis not present

## 2022-07-18 DIAGNOSIS — Z7182 Exercise counseling: Secondary | ICD-10-CM | POA: Diagnosis not present

## 2022-07-18 NOTE — Progress Notes (Addendum)
GI Office Note    Referring Provider: Celene Squibb, MD Primary Care Physician:  Celene Squibb, MD  Primary Gastroenterologist: Garfield Cornea, MD   Chief Complaint   Chief Complaint  Patient presents with   Follow-up    ER follow up for diverticulitis.      History of Present Illness   [name] is a 76 y.o. male presenting today at the request of the ED for further evaluation of diarrhea and abdominal pain. Patient is a difficult historian.  Patient seen in ED 07/11/22 reporting one week of watery stools. He had went to [facility] that day for evaluation but was advised to go to ER for evaluation and potentially CT scan to rule out diverticulitis.   At baseline for the past year or so, he has had issues with loose stools, sometimes associated with fecal incontinence due to urgency. However, usually only have 1-2 stools per day. He states symptoms started before he started metformin but it appears he has been on metformin longer than he recalls because it was listed on medication list at least back in 12/2020. Recently had acute onset watery stools, loss of appetite. Mild LLQ pain before BM. Denies ill contacts. He has taken antibiotics recently for UTI. He was hospitalized in 04/2022 for stroke. Denies heartburn, dysphagia. He has had some nausea.   In the ED, CT showed diverticulosis but no diverticulitis. CBC, LFTs, renal labs, lipase unremarkable. He was given loperamide '2mg'$  to take every 6 hours as needed.   Patient states the first day he took loperamide four times. He did not have a stool for 6 days, finally has small solid stool today. Denies abdominal pain. Biggest complaint is poor appetite. Eating only bites at a time. Drinking liquids. Still taking metformin bid.   He has a history of Barrett's esophagus dating back at least until 1998 with last pathology available in 2004 showing no dysplasia. He is not sure if he had EGD at [facility] but denies recent EGD.    Recent  admission October 2023 for ataxia and concern for stroke.  MRI brain confirmed 2 small adjacent acute infarcts within the posterior left frontal lobe white matter, few small subacute appearing infarcts within the left greater then right centrum semiovale, small chronic cortical/subcortical infarct within the posterior right frontal lobe new from 2022, subacute ischemic stroke felt to be cardioembolic.  Neurology recommended aspirin and Plavix for 3 weeks followed by aspirin daily as well as rosuvastatin daily.  2D echo without significant findings.  ENT and vascular referral arranged for carotid mass incidentally noted on head and neck CT imaging. Notably carotid body tumor noted on CT 2015. Vascular does not advise any treatment, will monitor with plan for follow up in 2 years.    Colonoscopy 2004 diverticulosis, external hemorrhoids, 1 cm smooth soft rectal lesion question etiology referred to Dr. Lennie Hummer.  EGD 2004: Barrett's esophagus with no dysplasia   Medications   Current Outpatient Medications  Medication Sig Dispense Refill   amLODipine (NORVASC) 5 MG tablet TAKE ONE TABLET BY MOUTH ONCE DAILY. 90 tablet 3   aspirin EC 81 MG tablet Take 1 tablet (81 mg total) by mouth daily. 90 tablet 3   clopidogrel (PLAVIX) 75 MG tablet Take 75 mg by mouth daily.     lisinopril (ZESTRIL) 20 MG tablet Take 20 mg by mouth daily.     loperamide (IMODIUM) 2 MG capsule Take 1 capsule (2 mg total) by  mouth 4 (four) times daily as needed for diarrhea or loose stools. 12 capsule 0   metFORMIN (GLUCOPHAGE) 500 MG tablet Take 500 mg by mouth 2 (two) times daily with a meal.     omeprazole (PRILOSEC) 20 MG capsule Take 1 capsule by mouth daily.     rosuvastatin (CRESTOR) 20 MG tablet Take 20 mg by mouth daily.     No current facility-administered medications for this visit.    Allergies   Allergies as of 07/18/2022 - Review Complete 07/18/2022  Allergen Reaction Noted   Simvastatin Rash 10/08/2010     Past Medical History   Past Medical History:  Diagnosis Date   Barrett esophagus    CAD (coronary artery disease)    Carotid body tumor (HCC)    Depression    Essential hypertension    GERD (gastroesophageal reflux disease)    Mixed hyperlipidemia    PTSD (post-traumatic stress disorder)     Past Surgical History   Past Surgical History:  Procedure Laterality Date   ORTHOPEDIC SURGERY     left knee arthroplasty    Past Family History   Family History  Problem Relation Age of Onset   CVA Father        Cerebral hemorrhage    Past Social History   Social History   Socioeconomic History   Marital status: Married    Spouse name: Not on file   Number of children: Not on file   Years of education: Not on file   Highest education level: Not on file  Occupational History   Not on file  Tobacco Use   Smoking status: Former    Packs/day: 1.00    Years: 2.00    Total pack years: 2.00    Types: Cigarettes    Quit date: 42    Years since quitting: 49.0   Smokeless tobacco: Never  Vaping Use   Vaping Use: Never used  Substance and Sexual Activity   Alcohol use: Never   Drug use: Never   Sexual activity: Not Currently  Other Topics Concern   Not on file  Social History Narrative   Right handed   Lives with wife in one story home   Social Determinants of Health   Financial Resource Strain: Not on file  Food Insecurity: No Food Insecurity (04/22/2022)   Hunger Vital Sign    Worried About Running Out of Food in the Last Year: Never true    Ran Out of Food in the Last Year: Never true  Transportation Needs: No Transportation Needs (04/22/2022)   PRAPARE - Hydrologist (Medical): No    Lack of Transportation (Non-Medical): No  Physical Activity: Not on file  Stress: Not on file  Social Connections: Not on file  Intimate Partner Violence: Not At Risk (04/22/2022)   Humiliation, Afraid, Rape, and Kick questionnaire    Fear of  Current or Ex-Partner: No    Emotionally Abused: No    Physically Abused: No    Sexually Abused: No    Review of Systems   General: Negative for  weight loss, fever, chills, fatigue.see hpi Eyes: Negative for vision changes.  ENT: Negative for hoarseness, difficulty swallowing , nasal congestion. CV: Negative for chest pain, angina, palpitations, dyspnea on exertion, peripheral edema.  Respiratory: Negative for dyspnea at rest, dyspnea on exertion, cough, sputum, wheezing.  GI: See history of present illness. GU:  Negative for dysuria, hematuria, urinary incontinence, urinary frequency, nocturnal urination.  MS:  Negative for low back pain. Left ankle pain scheduled for joint replacement Derm: Negative for rash or itching.  Neuro: Negative for weakness, abnormal sensation, seizure, frequent headaches, memory loss,  confusion.  Psych: Negative for anxiety, depression, suicidal ideation, hallucinations.  Endo: Negative for unusual weight change.  Heme: Negative for bruising or bleeding. Allergy: Negative for rash or hives.  Physical Exam   BP 136/81 (BP Location: Right Arm, Patient Position: Sitting, Cuff Size: Large)   Pulse 64   Temp 97.6 F (36.4 C) (Oral)   Ht '5\' 5"'$  (1.651 m)   Wt 196 lb 3.2 oz (89 kg)   SpO2 97%   BMI 32.65 kg/m    General: Well-nourished, well-developed in no acute distress.  Head: Normocephalic, atraumatic.   Eyes: Conjunctiva pink, no icterus. Mouth: Oropharyngeal mucosa moist and pink   Neck: Supple without thyromegaly, masses, or lymphadenopathy.  Lungs: Clear to auscultation bilaterally.  Heart: Regular rate and rhythm, no murmurs rubs or gallops.  Abdomen: Bowel sounds are normal, nontender, nondistended, no hepatosplenomegaly or masses,  no abdominal bruits or hernia, no rebound or guarding.   Rectal: not performed Extremities: No lower extremity edema. No clubbing or deformities.  Neuro: Alert and oriented x 4 , grossly normal neurologically.   Skin: Warm and dry, no rash or jaundice.   Psych: Alert and cooperative, normal mood and affect.  Labs   Lab Results  Component Value Date   HGBA1C 6.6 (H) 04/22/2022   Lab Results  Component Value Date   CREATININE 1.06 07/11/2022   BUN 10 07/11/2022   NA 141 07/11/2022   K 3.9 07/11/2022   CL 107 07/11/2022   CO2 26 07/11/2022   Lab Results  Component Value Date   ALT 20 07/11/2022   AST 26 07/11/2022   ALKPHOS 64 07/11/2022   BILITOT 0.6 07/11/2022   Lab Results  Component Value Date   WBC 6.2 07/11/2022   HGB 14.0 07/11/2022   HCT 42.6 07/11/2022   MCV 88.4 07/11/2022   PLT 209 07/11/2022   Lab Results  Component Value Date   LIPASE 27 07/11/2022    Imaging Studies   CT ABDOMEN PELVIS W CONTRAST  Result Date: 07/11/2022 CLINICAL DATA:  Left lower quadrant abdominal pain, diarrhea for 1 month EXAM: CT ABDOMEN AND PELVIS WITH CONTRAST TECHNIQUE: Multidetector CT imaging of the abdomen and pelvis was performed using the standard protocol following bolus administration of intravenous contrast. RADIATION DOSE REDUCTION: This exam was performed according to the departmental dose-optimization program which includes automated exposure control, adjustment of the mA and/or kV according to patient size and/or use of iterative reconstruction technique. CONTRAST:  147m OMNIPAQUE IOHEXOL 300 MG/ML  SOLN COMPARISON:  04/18/2022 FINDINGS: Lower chest: No acute pleural or parenchymal lung disease. Hepatobiliary: No focal liver abnormality is seen. No gallstones, gallbladder wall thickening, or biliary dilatation. Pancreas: Unremarkable. No pancreatic ductal dilatation or surrounding inflammatory changes. Spleen: Normal in size without focal abnormality. Adrenals/Urinary Tract: Nonobstructing 8 mm right renal calculus unchanged. No left-sided calculi. The kidneys enhance normally and symmetrically. The adrenals and bladder are unremarkable. Stomach/Bowel: No bowel obstruction or  ileus. Normal appendix right lower quadrant. Diffuse colonic diverticulosis without evidence of diverticulitis. No bowel wall thickening or inflammatory change. Vascular/Lymphatic: Aortic atherosclerosis. No enlarged abdominal or pelvic lymph nodes. Reproductive: Prostate is unremarkable. Other: No free fluid or free intraperitoneal gas. No abdominal wall hernia. Musculoskeletal: No acute or destructive bony lesions. Reconstructed images demonstrate no additional findings. IMPRESSION: 1. Diffuse colonic  diverticulosis without evidence of diverticulitis. 2. Nonobstructing 8 mm right renal calculus. 3.  Aortic Atherosclerosis (ICD10-I70.0). Electronically Signed   By: Randa Ngo M.D.   On: 07/11/2022 18:30    Assessment/Plan:   Diarrhea: recent acute on chronic symptoms. Patient somewhat poor historian. Timeline does not always match up. States he has had loose stools for about six months initially but sounds like going on for longer. States it started before metformin, but he has been on that possibly since 12/2020. Typically having 1-2 loose stools daily at baseline sometimes associated with urgency and incontinence. Recently with watery frequent stools resolved after loperamide (took for 6 days ago) and in fact no stool in six days until today. No melena, brbpr. Really no significant abdominal pain. CT without colitis, diverticulitis. Last colonoscopy remote. Initially will rule out infectious etiology especially Cdiff given patient has been hospitalized and had recent antibiotics. Discuss that he may need colonoscopy at some point to sort out but before then would consider trial of metformin if stool test are negative. Would like to wait before invasive testing given recent strokes.   Poor oral intake: poor oral intake for 1-2 weeks. Encouraged boost or ensure to supplement for now. Consider decreasing metformin to once daily while appetite is not good.   Barrett's esophagus: no prior history of  dysplasia. No recent EGD. Given age and comorbidities, it may be appropriate to not pursue ongoing surveillance. Will discuss with Dr. Gala Romney.      Laureen Ochs. Bobby Rumpf Pearson, [facility]   Addendum 08/08/22: spoke to Dr. Gala Romney, would consider one last EGD for Barrett's surveillance if patient in reasonably good health. Fairly recent stroke, we will have patient follow up in 4-6 weeks to discuss ongoing GI symptoms.

## 2022-07-18 NOTE — Patient Instructions (Signed)
With next loose stools, please collect stool for testing.  Please cut back on metformin to one tablet a day with a meal since you are not eating well. Once your appetite returns to normal, then you can increase metformin back to twice daily.  Try to increase your food and liquid intake, eat small meals throughout the day, even if only a few bites. Consider adding Ensure or Boost to supplement diet right now until you are feeling better.  Stop loperamide.

## 2022-07-23 LAB — GI PROFILE, STOOL, PCR

## 2022-08-21 ENCOUNTER — Ambulatory Visit: Payer: No Typology Code available for payment source | Admitting: Gastroenterology

## 2022-08-28 DIAGNOSIS — J069 Acute upper respiratory infection, unspecified: Secondary | ICD-10-CM | POA: Diagnosis not present

## 2022-08-28 DIAGNOSIS — R030 Elevated blood-pressure reading, without diagnosis of hypertension: Secondary | ICD-10-CM | POA: Diagnosis not present

## 2022-09-01 DIAGNOSIS — M25572 Pain in left ankle and joints of left foot: Secondary | ICD-10-CM | POA: Diagnosis not present

## 2022-09-01 DIAGNOSIS — I25119 Atherosclerotic heart disease of native coronary artery with unspecified angina pectoris: Secondary | ICD-10-CM | POA: Diagnosis not present

## 2022-09-01 DIAGNOSIS — E039 Hypothyroidism, unspecified: Secondary | ICD-10-CM | POA: Diagnosis not present

## 2022-09-01 DIAGNOSIS — Z9182 Personal history of military deployment: Secondary | ICD-10-CM | POA: Diagnosis not present

## 2022-09-01 DIAGNOSIS — E119 Type 2 diabetes mellitus without complications: Secondary | ICD-10-CM | POA: Diagnosis not present

## 2022-09-01 DIAGNOSIS — I10 Essential (primary) hypertension: Secondary | ICD-10-CM | POA: Diagnosis not present

## 2022-09-01 DIAGNOSIS — G8929 Other chronic pain: Secondary | ICD-10-CM | POA: Diagnosis not present

## 2022-09-01 DIAGNOSIS — E559 Vitamin D deficiency, unspecified: Secondary | ICD-10-CM | POA: Diagnosis not present

## 2022-09-01 DIAGNOSIS — K219 Gastro-esophageal reflux disease without esophagitis: Secondary | ICD-10-CM | POA: Diagnosis not present

## 2022-09-01 DIAGNOSIS — Z01818 Encounter for other preprocedural examination: Secondary | ICD-10-CM | POA: Diagnosis not present

## 2022-09-03 DIAGNOSIS — E1169 Type 2 diabetes mellitus with other specified complication: Secondary | ICD-10-CM | POA: Diagnosis not present

## 2022-09-03 DIAGNOSIS — E782 Mixed hyperlipidemia: Secondary | ICD-10-CM | POA: Diagnosis not present

## 2022-09-03 DIAGNOSIS — E039 Hypothyroidism, unspecified: Secondary | ICD-10-CM | POA: Diagnosis not present

## 2022-09-09 DIAGNOSIS — Z0000 Encounter for general adult medical examination without abnormal findings: Secondary | ICD-10-CM | POA: Diagnosis not present

## 2022-09-09 DIAGNOSIS — E1169 Type 2 diabetes mellitus with other specified complication: Secondary | ICD-10-CM | POA: Diagnosis not present

## 2022-09-09 DIAGNOSIS — D446 Neoplasm of uncertain behavior of carotid body: Secondary | ICD-10-CM | POA: Diagnosis not present

## 2022-09-09 DIAGNOSIS — E559 Vitamin D deficiency, unspecified: Secondary | ICD-10-CM | POA: Diagnosis not present

## 2022-09-09 DIAGNOSIS — E039 Hypothyroidism, unspecified: Secondary | ICD-10-CM | POA: Diagnosis not present

## 2022-09-09 DIAGNOSIS — E782 Mixed hyperlipidemia: Secondary | ICD-10-CM | POA: Diagnosis not present

## 2022-09-09 DIAGNOSIS — M25551 Pain in right hip: Secondary | ICD-10-CM | POA: Diagnosis not present

## 2022-09-09 DIAGNOSIS — I10 Essential (primary) hypertension: Secondary | ICD-10-CM | POA: Diagnosis not present

## 2022-09-09 DIAGNOSIS — I700 Atherosclerosis of aorta: Secondary | ICD-10-CM | POA: Diagnosis not present

## 2022-09-09 DIAGNOSIS — K219 Gastro-esophageal reflux disease without esophagitis: Secondary | ICD-10-CM | POA: Diagnosis not present

## 2022-09-09 DIAGNOSIS — D649 Anemia, unspecified: Secondary | ICD-10-CM | POA: Diagnosis not present

## 2022-09-15 ENCOUNTER — Encounter: Payer: Self-pay | Admitting: Urology

## 2022-09-15 ENCOUNTER — Ambulatory Visit (INDEPENDENT_AMBULATORY_CARE_PROVIDER_SITE_OTHER): Payer: Medicare Other | Admitting: Urology

## 2022-09-15 VITALS — BP 106/70 | HR 63

## 2022-09-15 DIAGNOSIS — N3941 Urge incontinence: Secondary | ICD-10-CM | POA: Diagnosis not present

## 2022-09-15 DIAGNOSIS — N3000 Acute cystitis without hematuria: Secondary | ICD-10-CM

## 2022-09-15 LAB — BLADDER SCAN AMB NON-IMAGING: Scan Result: 5

## 2022-09-15 MED ORDER — SULFAMETHOXAZOLE-TRIMETHOPRIM 800-160 MG PO TABS: 1.0000 | ORAL_TABLET | Freq: Two times a day (BID) | ORAL | 0 refills | Status: DC

## 2022-09-15 NOTE — Progress Notes (Unsigned)
GI Office Note    Referring Provider: Celene Squibb, MD Primary Care Physician:  Celene Squibb, MD  Primary Gastroenterologist: Garfield Cornea, MD   Chief Complaint   No chief complaint on file.   History of Present Illness   [name] is a 77 y.o. male presenting today for follow-up.  Last seen in December 2023.  At that time seen for ED follow-up for diarrhea and abdominal pain.  ED evaluation showing diverticulosis, diverticulitis.  CBC, LFTs, renal labs, lipase unremarkable.  Patient completed GI profile July 22, 2022 which was negative.  He had reported resolution of diarrhea.  Presents today for follow-up.   Review of extensive records showed history of Barrett's esophagus dating back to at least 1998 with last pathology available in 2004 showing no dysplasia.  Patient was not sure if he had a EGD at [facility] in the interim.  Did not do any recent EGDs.   Recent admission October 2023 for ataxia and concern for stroke.  MRI brain confirmed 2 small adjacent acute infarcts within the posterior left frontal lobe white matter, few small subacute appearing infarcts within the left greater then right centrum semiovale, small chronic cortical/subcortical infarct within the posterior right frontal lobe new from 2022, subacute ischemic stroke felt to be cardioembolic.  Neurology recommended aspirin and Plavix for 3 weeks followed by aspirin daily as well as rosuvastatin daily.  2D echo without significant findings.  ENT and vascular referral arranged for carotid mass incidentally noted on head and neck CT imaging. Notably carotid body tumor noted on CT 2015. Vascular does not advise any treatment, will monitor with plan for follow up in 2 years.    Colonoscopy 2004 diverticulosis, external hemorrhoids, 1 cm smooth soft rectal lesion question etiology referred to Dr. Lennie Hummer.   EGD 2004: Barrett's esophagus with no dysplasia         Medications   Current Outpatient Medications   Medication Sig Dispense Refill   amLODipine (NORVASC) 5 MG tablet TAKE ONE TABLET BY MOUTH ONCE DAILY. 90 tablet 3   aspirin EC 81 MG tablet Take 1 tablet (81 mg total) by mouth daily. 90 tablet 3   clopidogrel (PLAVIX) 75 MG tablet Take 75 mg by mouth daily.     lisinopril (ZESTRIL) 20 MG tablet Take 20 mg by mouth daily.     loperamide (IMODIUM) 2 MG capsule Take 1 capsule (2 mg total) by mouth 4 (four) times daily as needed for diarrhea or loose stools. 12 capsule 0   metFORMIN (GLUCOPHAGE) 500 MG tablet Take 500 mg by mouth 2 (two) times daily with a meal.     omeprazole (PRILOSEC) 20 MG capsule Take 1 capsule by mouth daily.     rosuvastatin (CRESTOR) 20 MG tablet Take 20 mg by mouth daily.     No current facility-administered medications for this visit.    Allergies   Allergies as of 09/16/2022 - Review Complete 09/15/2022  Allergen Reaction Noted   Simvastatin Rash 10/08/2010     Past Medical History   Past Medical History:  Diagnosis Date   Barrett esophagus    CAD (coronary artery disease)    Carotid body tumor ([place])    Depression    Essential hypertension    GERD (gastroesophageal reflux disease)    Mixed hyperlipidemia    PTSD (post-traumatic stress disorder)     Past Surgical History   Past Surgical History:  Procedure Laterality Date   ORTHOPEDIC SURGERY  left knee arthroplasty    Past Family History   Family History  Problem Relation Age of Onset   CVA Father        Cerebral hemorrhage    Past Social History   Social History   Socioeconomic History   Marital status: Married    Spouse name: Not on file   Number of children: Not on file   Years of education: Not on file   Highest education level: Not on file  Occupational History   Not on file  Tobacco Use   Smoking status: Former    Packs/day: 1.00    Years: 2.00    Total pack years: 2.00    Types: Cigarettes    Quit date: 58    Years since quitting: 49.1   Smokeless tobacco:  Never  Vaping Use   Vaping Use: Never used  Substance and Sexual Activity   Alcohol use: Never   Drug use: Never   Sexual activity: Not Currently  Other Topics Concern   Not on file  Social History Narrative   Right handed   Lives with wife in one story home   Social Determinants of Health   Financial Resource Strain: Not on file  Food Insecurity: No Food Insecurity (04/22/2022)   Hunger Vital Sign    Worried About Running Out of Food in the Last Year: Never true    Ran Out of Food in the Last Year: Never true  Transportation Needs: No Transportation Needs (04/22/2022)   PRAPARE - Hydrologist (Medical): No    Lack of Transportation (Non-Medical): No  Physical Activity: Not on file  Stress: Not on file  Social Connections: Not on file  Intimate Partner Violence: Not At Risk (04/22/2022)   Humiliation, Afraid, Rape, and Kick questionnaire    Fear of Current or Ex-Partner: No    Emotionally Abused: No    Physically Abused: No    Sexually Abused: No    Review of Systems   General: Negative for anorexia, weight loss, fever, chills, fatigue, weakness. ENT: Negative for hoarseness, difficulty swallowing , nasal congestion. CV: Negative for chest pain, angina, palpitations, dyspnea on exertion, peripheral edema.  Respiratory: Negative for dyspnea at rest, dyspnea on exertion, cough, sputum, wheezing.  GI: See history of present illness. GU:  Negative for dysuria, hematuria, urinary incontinence, urinary frequency, nocturnal urination.  Endo: Negative for unusual weight change.     Physical Exam   There were no vitals taken for this visit.   General: Well-nourished, well-developed in no acute distress.  Eyes: No icterus. Mouth: Oropharyngeal mucosa moist and pink , no lesions erythema or exudate. Lungs: Clear to auscultation bilaterally.  Heart: Regular rate and rhythm, no murmurs rubs or gallops.  Abdomen: Bowel sounds are normal, nontender,  nondistended, no hepatosplenomegaly or masses,  no abdominal bruits or hernia , no rebound or guarding.  Rectal: ***  Extremities: No lower extremity edema. No clubbing or deformities. Neuro: Alert and oriented x 4   Skin: Warm and dry, no jaundice.   Psych: Alert and cooperative, normal mood and affect.  Labs   *** Imaging Studies   No results found.  Assessment       PLAN   ***   Laureen Ochs. Bobby Rumpf, [place], [facility]

## 2022-09-15 NOTE — Progress Notes (Signed)
post void residual=5

## 2022-09-15 NOTE — Patient Instructions (Signed)
Urinary Tract Infection, Adult  A urinary tract infection (UTI) is an infection of any part of the urinary tract. The urinary tract includes the kidneys, ureters, bladder, and urethra. These organs make, store, and get rid of urine in the body. An upper UTI affects the ureters and kidneys. A lower UTI affects the bladder and urethra. What are the causes? Most urinary tract infections are caused by bacteria in your genital area around your urethra, where urine leaves your body. These bacteria grow and cause inflammation of your urinary tract. What increases the risk? You are more likely to develop this condition if: You have a urinary catheter that stays in place. You are not able to control when you urinate or have a bowel movement (incontinence). You are male and you: Use a spermicide or diaphragm for birth control. Have low estrogen levels. Are pregnant. You have certain genes that increase your risk. You are sexually active. You take antibiotic medicines. You have a condition that causes your flow of urine to slow down, such as: An enlarged prostate, if you are male. Blockage in your urethra. A kidney stone. A nerve condition that affects your bladder control (neurogenic bladder). Not getting enough to drink, or not urinating often. You have certain medical conditions, such as: Diabetes. A weak disease-fighting system (immunesystem). Sickle cell disease. Gout. Spinal cord injury. What are the signs or symptoms? Symptoms of this condition include: Needing to urinate right away (urgency). Frequent urination. This may include small amounts of urine each time you urinate. Pain or burning with urination. Blood in the urine. Urine that smells bad or unusual. Trouble urinating. Cloudy urine. Vaginal discharge, if you are male. Pain in the abdomen or the lower back. You may also have: Vomiting or a decreased appetite. Confusion. Irritability or tiredness. A fever or  chills. Diarrhea. The first symptom in older adults may be confusion. In some cases, they may not have any symptoms until the infection has worsened. How is this diagnosed? This condition is diagnosed based on your medical history and a physical exam. You may also have other tests, including: Urine tests. Blood tests. Tests for STIs (sexually transmitted infections). If you have had more than one UTI, a cystoscopy or imaging studies may be done to determine the cause of the infections. How is this treated? Treatment for this condition includes: Antibiotic medicine. Over-the-counter medicines to treat discomfort. Drinking enough water to stay hydrated. If you have frequent infections or have other conditions such as a kidney stone, you may need to see a health care provider who specializes in the urinary tract (urologist). In rare cases, urinary tract infections can cause sepsis. Sepsis is a life-threatening condition that occurs when the body responds to an infection. Sepsis is treated in the hospital with IV antibiotics, fluids, and other medicines. Follow these instructions at home:  Medicines Take over-the-counter and prescription medicines only as told by your health care provider. If you were prescribed an antibiotic medicine, take it as told by your health care provider. Do not stop using the antibiotic even if you start to feel better. General instructions Make sure you: Empty your bladder often and completely. Do not hold urine for long periods of time. Empty your bladder after sex. Wipe from front to back after urinating or having a bowel movement if you are male. Use each tissue only one time when you wipe. Drink enough fluid to keep your urine pale yellow. Keep all follow-up visits. This is important. Contact a health   care provider if: Your symptoms do not get better after 1-2 days. Your symptoms go away and then return. Get help right away if: You have severe pain in  your back or your lower abdomen. You have a fever or chills. You have nausea or vomiting. Summary A urinary tract infection (UTI) is an infection of any part of the urinary tract, which includes the kidneys, ureters, bladder, and urethra. Most urinary tract infections are caused by bacteria in your genital area. Treatment for this condition often includes antibiotic medicines. If you were prescribed an antibiotic medicine, take it as told by your health care provider. Do not stop using the antibiotic even if you start to feel better. Keep all follow-up visits. This is important. This information is not intended to replace advice given to you by your health care provider. Make sure you discuss any questions you have with your health care provider. Document Revised: 02/17/2020 Document Reviewed: 02/17/2020 Elsevier Patient Education  2023 Elsevier Inc.  

## 2022-09-15 NOTE — Progress Notes (Signed)
09/15/2022 1:52 PM   [name] May 06, 1946 [id number]  Referring provider: Benita Stabile, MD [address] [place],  [place] [address]  Followup urge incontinence   HPI: [name] is a 77yo here for followup for urge incontinence and with new onset dysuria. He noted his last UTI 1 year ago was treated with IV antibiotics and he noted improvement in his left knee pain at his joint replacement. His joint pain has returned and he notes dysuria for the past 2 weeks. IPSS 10 QOL 4. He is off [place]. He has rare urge incontinence episodes.    PMH: Past Medical History:  Diagnosis Date   Barrett esophagus    CAD (coronary artery disease)    Carotid body tumor ([facility])    Depression    Essential hypertension    GERD (gastroesophageal reflux disease)    Mixed hyperlipidemia    PTSD (post-traumatic stress disorder)     Surgical History: Past Surgical History:  Procedure Laterality Date   ORTHOPEDIC SURGERY     left knee arthroplasty    Home Medications:  Allergies as of 09/15/2022       Reactions   Simvastatin Rash   Other reaction(s): Muscle Pain, Other (See Comments)        Medication List        Accurate as of September 15, 2022  1:52 PM. If you have any questions, ask your nurse or doctor.          amLODipine 5 MG tablet Commonly known as: NORVASC TAKE ONE TABLET BY MOUTH ONCE DAILY.   aspirin EC 81 MG tablet Take 1 tablet (81 mg total) by mouth daily.   clopidogrel 75 MG tablet Commonly known as: PLAVIX Take 75 mg by mouth daily.   lisinopril 20 MG tablet Commonly known as: ZESTRIL Take 20 mg by mouth daily.   loperamide 2 MG capsule Commonly known as: IMODIUM Take 1 capsule (2 mg total) by mouth 4 (four) times daily as needed for diarrhea or loose stools.   metFORMIN 500 MG tablet Commonly known as: GLUCOPHAGE Take 500 mg by mouth 2 (two) times daily with a meal.   omeprazole 20 MG capsule Commonly known as: PRILOSEC Take 1 capsule by mouth  daily.   rosuvastatin 20 MG tablet Commonly known as: CRESTOR Take 20 mg by mouth daily.        Allergies:  Allergies  Allergen Reactions   Simvastatin Rash    Other reaction(s): Muscle Pain, Other (See Comments)    Family History: Family History  Problem Relation Age of Onset   CVA Father        Cerebral hemorrhage    Social History:  reports that he quit smoking about 49 years ago. His smoking use included cigarettes. He has a 2.00 pack-year smoking history. He has never used smokeless tobacco. He reports that he does not drink alcohol and does not use drugs.  ROS: All other review of systems were reviewed and are negative except what is noted above in HPI  Physical Exam: BP 106/70   Pulse 63   Constitutional:  Alert and oriented, No acute distress. HEENT: [place] AT, moist mucus membranes.  Trachea midline, no masses. Cardiovascular: No clubbing, cyanosis, or edema. Respiratory: Normal respiratory effort, no increased work of breathing. GI: Abdomen is soft, nontender, nondistended, no abdominal masses GU: No CVA tenderness.  Lymph: No cervical or inguinal lymphadenopathy. Skin: No rashes, bruises or suspicious lesions. Neurologic: Grossly intact, no focal  deficits, moving all 4 extremities. Psychiatric: Normal mood and affect.  Laboratory Data: Lab Results  Component Value Date   WBC 6.2 07/11/2022   HGB 14.0 07/11/2022   HCT 42.6 07/11/2022   MCV 88.4 07/11/2022   PLT 209 07/11/2022    Lab Results  Component Value Date   CREATININE 1.06 07/11/2022    No results found for: "PSA"  No results found for: "TESTOSTERONE"  Lab Results  Component Value Date   HGBA1C 6.6 (H) 04/22/2022    Urinalysis    Component Value Date/Time   COLORURINE YELLOW 07/11/2022 1528   APPEARANCEUR HAZY (A) 07/11/2022 1528   APPEARANCEUR Clear 06/17/2021 1502   LABSPEC 1.025 07/11/2022 1528   PHURINE 5.0 07/11/2022 1528   GLUCOSEU NEGATIVE 07/11/2022 1528   HGBUR NEGATIVE  07/11/2022 1528   BILIRUBINUR NEGATIVE 07/11/2022 1528   BILIRUBINUR Negative 06/17/2021 1502   KETONESUR 5 (A) 07/11/2022 1528   PROTEINUR 30 (A) 07/11/2022 1528   UROBILINOGEN 1.0 08/11/2008 1421   NITRITE NEGATIVE 07/11/2022 1528   LEUKOCYTESUR LARGE (A) 07/11/2022 1528    Lab Results  Component Value Date   LABMICR See below: 06/17/2021   WBCUA 0-5 06/17/2021   LABEPIT 0-10 06/17/2021   BACTERIA NONE SEEN 07/11/2022    Pertinent Imaging:  No results found for this or any previous visit.  No results found for this or any previous visit.  No results found for this or any previous visit.  No results found for this or any previous visit.  No results found for this or any previous visit.  No valid procedures specified. No results found for this or any previous visit.  No results found for this or any previous visit.   Assessment & Plan:    1. Urge incontinence of urine -patient defers therapy at this time - Urinalysis, Routine w reflex microscopic - BLADDER SCAN AMB NON-IMAGING  2. Acute cystitis without hematuria -urine for culture -bactrim DS BID for 7 days   No follow-ups on file.  Russell Aye, MD  [name]

## 2022-09-15 NOTE — H&P (View-Only) (Signed)
GI Office Note    Referring Provider: Celene Squibb, MD Primary Care Physician:  Celene Squibb, MD  Primary Gastroenterologist: Garfield Cornea, MD   Chief Complaint   Chief Complaint  Patient presents with   Follow-up    Doing well.     History of Present Illness   [name] is a 77 y.o. male presenting today for follow-up.  Last seen in December 2023.  At that time seen for ED follow-up for diarrhea and abdominal pain.  ED evaluation showing diverticulosis.  CBC, LFTs, renal labs, lipase unremarkable.  Patient completed GI profile July 22, 2022 which was negative.  He had reported resolution of diarrhea.  Presents today for follow-up. Review of extensive records showed history of Barrett's esophagus dating back to at least 1998 with last pathology available in 2004 showing no dysplasia.  Patient was not sure if he had a EGD at [facility] in the interim.  Did not do any recent EGDs.  Patient has upcoming ankle arthroplasty scheduled for 09/25/22.  Patient's weight continues to drop, down 7 pounds since last office visit in December.  However in October his weight fluctuated quite a bit anywhere from 191 pounds to 202 pounds.  Today he weighs 189 pounds.  Feels gas pain. Feels it in lower abdominal before bowel movement. Can go a week without a BM. Not taking anything for constipation. Appetite remains poor. No nausea/vomiting. Has had no issues swallowing since remote esophageal dilation. No heartburn on omeprazole. Some early satiety.   Previous history:  Recent admission October 2023 for ataxia and concern for stroke.  MRI brain confirmed 2 small adjacent acute infarcts within the posterior left frontal lobe white matter, few small subacute appearing infarcts within the left greater then right centrum semiovale, small chronic cortical/subcortical infarct within the posterior right frontal lobe new from 2022, subacute ischemic stroke felt to be cardioembolic.  Neurology recommended  aspirin and Plavix for 3 weeks followed by aspirin daily as well as rosuvastatin daily.  2D echo without significant findings.  ENT and vascular referral arranged for carotid mass incidentally noted on head and neck CT imaging. Notably carotid body tumor noted on CT 2015. Vascular does not advise any treatment, will monitor with plan for follow up in 2 years.    Colonoscopy 2004 diverticulosis, external hemorrhoids, 1 cm smooth soft rectal lesion question etiology referred to Dr. Lennie Hummer.   EGD 2004: Barrett's esophagus with no dysplasia    Medications   Current Outpatient Medications  Medication Sig Dispense Refill   amLODipine (NORVASC) 5 MG tablet TAKE ONE TABLET BY MOUTH ONCE DAILY. 90 tablet 3   aspirin EC 81 MG tablet Take 1 tablet (81 mg total) by mouth daily. 90 tablet 3   clopidogrel (PLAVIX) 75 MG tablet Take 75 mg by mouth daily.     lisinopril (ZESTRIL) 20 MG tablet Take 20 mg by mouth daily.     loperamide (IMODIUM) 2 MG capsule Take 1 capsule (2 mg total) by mouth 4 (four) times daily as needed for diarrhea or loose stools. 12 capsule 0   metFORMIN (GLUCOPHAGE) 500 MG tablet Take 500 mg by mouth 2 (two) times daily with a meal.     omeprazole (PRILOSEC) 20 MG capsule Take 1 capsule by mouth daily.     rosuvastatin (CRESTOR) 20 MG tablet Take 20 mg by mouth daily.     sulfamethoxazole-trimethoprim (BACTRIM DS) 800-160 MG tablet Take 1 tablet by mouth every 12 (twelve)  hours. 14 tablet 0   No current facility-administered medications for this visit.    Allergies   Allergies as of 09/16/2022 - Review Complete 09/16/2022  Allergen Reaction Noted   Simvastatin Rash 10/08/2010     Past Medical History   Past Medical History:  Diagnosis Date   Barrett esophagus    CAD (coronary artery disease)    Carotid body tumor (HCC)    Depression    Essential hypertension    GERD (gastroesophageal reflux disease)    Mixed hyperlipidemia    PTSD (post-traumatic stress disorder)      Past Surgical History   Past Surgical History:  Procedure Laterality Date   ORTHOPEDIC SURGERY     left knee arthroplasty    Past Family History   Family History  Problem Relation Age of Onset   CVA Father        Cerebral hemorrhage    Past Social History   Social History   Socioeconomic History   Marital status: Married    Spouse name: Not on file   Number of children: Not on file   Years of education: Not on file   Highest education level: Not on file  Occupational History   Not on file  Tobacco Use   Smoking status: Former    Packs/day: 1.00    Years: 2.00    Total pack years: 2.00    Types: Cigarettes    Quit date: 13    Years since quitting: 49.1   Smokeless tobacco: Never  Vaping Use   Vaping Use: Never used  Substance and Sexual Activity   Alcohol use: Never   Drug use: Never   Sexual activity: Not Currently  Other Topics Concern   Not on file  Social History Narrative   Right handed   Lives with wife in one story home   Social Determinants of Health   Financial Resource Strain: Not on file  Food Insecurity: No Food Insecurity (04/22/2022)   Hunger Vital Sign    Worried About Running Out of Food in the Last Year: Never true    Ran Out of Food in the Last Year: Never true  Transportation Needs: No Transportation Needs (04/22/2022)   PRAPARE - Hydrologist (Medical): No    Lack of Transportation (Non-Medical): No  Physical Activity: Not on file  Stress: Not on file  Social Connections: Not on file  Intimate Partner Violence: Not At Risk (04/22/2022)   Humiliation, Afraid, Rape, and Kick questionnaire    Fear of Current or Ex-Partner: No    Emotionally Abused: No    Physically Abused: No    Sexually Abused: No    Review of Systems   General: Negative for fever, chills, fatigue, weakness. ENT: Negative for hoarseness, difficulty swallowing , nasal congestion. CV: Negative for chest pain, angina,  palpitations, dyspnea on exertion, peripheral edema.  Respiratory: Negative for dyspnea at rest, dyspnea on exertion, cough, sputum, wheezing.  GI: See history of present illness. GU:  Negative for dysuria, hematuria, urinary incontinence, urinary frequency, nocturnal urination.  Endo: Negative for unusual weight change.     Physical Exam   BP 123/74 (BP Location: Right Arm, Patient Position: Sitting, Cuff Size: Large)   Pulse 66   Temp (!) 97.3 F (36.3 C) (Oral)   Ht '5\' 5"'$  (1.651 m)   Wt 189 lb 9.6 oz (86 kg)   SpO2 100%   BMI 31.55 kg/m    General: Well-nourished, well-developed in  no acute distress.  Eyes: No icterus. Mouth: Oropharyngeal mucosa moist and pink   Lungs: Clear to auscultation bilaterally.  Heart: Regular rate and rhythm, no murmurs rubs or gallops.  Abdomen: Bowel sounds are normal, nontender, nondistended, no hepatosplenomegaly or masses,  no abdominal bruits or hernia , no rebound or guarding.  Rectal: not performed Extremities: No lower extremity edema. No clubbing or deformities. Neuro: Alert and oriented x 4   Skin: Warm and dry, no jaundice.   Psych: Alert and cooperative, normal mood and affect.  Labs   Lab Results  Component Value Date   CREATININE 1.06 07/11/2022   BUN 10 07/11/2022   NA 141 07/11/2022   K 3.9 07/11/2022   CL 107 07/11/2022   CO2 26 07/11/2022   Lab Results  Component Value Date   ALT 20 07/11/2022   AST 26 07/11/2022   ALKPHOS 64 07/11/2022   BILITOT 0.6 07/11/2022   Lab Results  Component Value Date   WBC 6.2 07/11/2022   HGB 14.0 07/11/2022   HCT 42.6 07/11/2022   MCV 88.4 07/11/2022   PLT 209 07/11/2022   Lab Results  Component Value Date   LIPASE 27 07/11/2022   Lab Results  Component Value Date   HGBA1C 6.6 (H) 04/22/2022    Imaging Studies   No results found.  Assessment   Early satiety/weight loss/Barrett's esophagus: ongoing symptoms with previous diagnosis of Barrett's esophagus. Recommend  EGD to rule out gastritis/ulcer/malignancy. He denies dysphagia but will consent for possible esophageal dilation given his prior history of esophageal strictures.   Constipation: back to baseline constipation. Worse with decreased oral intake.   PLAN   EGD+/-ED with Dr. Gala Romney. ASA 3.  I have discussed the risks, alternatives, benefits with regards to but not limited to the risk of reaction to medication, bleeding, infection, perforation and the patient is agreeable to proceed. Written consent to be obtained. Continue omeprazole 20 mg daily Start MiraLAX 1 capful twice daily until soft stool, then continue once daily.   Laureen Ochs. Bobby Rumpf, [place], [facility]

## 2022-09-16 ENCOUNTER — Telehealth: Payer: Self-pay | Admitting: *Deleted

## 2022-09-16 ENCOUNTER — Encounter: Payer: Self-pay | Admitting: *Deleted

## 2022-09-16 ENCOUNTER — Ambulatory Visit (INDEPENDENT_AMBULATORY_CARE_PROVIDER_SITE_OTHER): Payer: Medicare Other | Admitting: Gastroenterology

## 2022-09-16 ENCOUNTER — Encounter: Payer: Self-pay | Admitting: Gastroenterology

## 2022-09-16 VITALS — BP 123/74 | HR 66 | Temp 97.3°F | Ht 65.0 in | Wt 189.6 lb

## 2022-09-16 DIAGNOSIS — R6881 Early satiety: Secondary | ICD-10-CM

## 2022-09-16 DIAGNOSIS — R634 Abnormal weight loss: Secondary | ICD-10-CM | POA: Diagnosis not present

## 2022-09-16 DIAGNOSIS — K2270 Barrett's esophagus without dysplasia: Secondary | ICD-10-CM | POA: Diagnosis not present

## 2022-09-16 LAB — URINALYSIS, ROUTINE W REFLEX MICROSCOPIC
Bilirubin, UA: NEGATIVE
Glucose, UA: NEGATIVE
Ketones, UA: NEGATIVE
Nitrite, UA: NEGATIVE
RBC, UA: NEGATIVE
Specific Gravity, UA: 1.025 (ref 1.005–1.030)
Urobilinogen, Ur: 1 mg/dL (ref 0.2–1.0)
pH, UA: 5.5 (ref 5.0–7.5)

## 2022-09-16 LAB — MICROSCOPIC EXAMINATION

## 2022-09-16 MED ORDER — POLYETHYLENE GLYCOL 3350 17 G PO PACK: PACK | ORAL | 0 refills | Status: DC

## 2022-09-16 NOTE — Telephone Encounter (Signed)
[employer] PA: APPROVED Authorization #: [id number] DOS: 09/18/22-12/17/22

## 2022-09-16 NOTE — Telephone Encounter (Signed)
[facility] on cell # and home #  Pre-op telephone is scheduled for tomorrow 09/17/22.

## 2022-09-16 NOTE — Telephone Encounter (Signed)
Pt informed of pre-op telephone call for tomorrow 09/17/22.

## 2022-09-16 NOTE — Patient Instructions (Signed)
Upper endoscopy in near future. See separate instructions.  Continue omeprazole '20mg'$  daily. For constipation, start Miralax one capful twice daily until soft stool, then continue once daily as needed.

## 2022-09-17 ENCOUNTER — Encounter (HOSPITAL_COMMUNITY)
Admission: RE | Admit: 2022-09-17 | Discharge: 2022-09-17 | Disposition: A | Discharge status: Home / Self Care | Payer: No Typology Code available for payment source | Source: Ambulatory Visit | Attending: Internal Medicine | Admitting: Internal Medicine

## 2022-09-17 ENCOUNTER — Encounter (HOSPITAL_COMMUNITY): Payer: Self-pay

## 2022-09-17 ENCOUNTER — Other Ambulatory Visit: Payer: Self-pay

## 2022-09-17 ENCOUNTER — Telehealth: Payer: Self-pay | Admitting: *Deleted

## 2022-09-17 HISTORY — DX: Type 2 diabetes mellitus without complications: E11.9

## 2022-09-17 LAB — URINE CULTURE: Organism ID, Bacteria: NO GROWTH

## 2022-09-17 NOTE — Telephone Encounter (Signed)
Received email from pre-service center stating pt needs to have PA from [place] for procedure for tomorrow. Spoke with Santiago Glad at [facility] and she says pt needs to contact his primary doctor at [facility] to let provider know what's going on and to get the PA started. She says she will expedite the referral ASAP once that is done.

## 2022-09-18 ENCOUNTER — Encounter (HOSPITAL_COMMUNITY)
Admission: RE | Disposition: A | Discharge status: Home / Self Care | Payer: Self-pay | Source: Home / Self Care | Attending: Internal Medicine

## 2022-09-18 ENCOUNTER — Encounter (HOSPITAL_COMMUNITY): Payer: Self-pay | Admitting: Internal Medicine

## 2022-09-18 ENCOUNTER — Ambulatory Visit (HOSPITAL_COMMUNITY): Payer: Medicare Other | Admitting: Anesthesiology

## 2022-09-18 ENCOUNTER — Ambulatory Visit (HOSPITAL_BASED_OUTPATIENT_CLINIC_OR_DEPARTMENT_OTHER): Payer: Medicare Other | Admitting: Anesthesiology

## 2022-09-18 ENCOUNTER — Ambulatory Visit (HOSPITAL_COMMUNITY)
Admission: RE | Admit: 2022-09-18 | Discharge: 2022-09-18 | Disposition: A | Discharge status: Home / Self Care | Payer: Medicare Other | Attending: Internal Medicine | Admitting: Internal Medicine

## 2022-09-18 DIAGNOSIS — F32A Depression, unspecified: Secondary | ICD-10-CM | POA: Insufficient documentation

## 2022-09-18 DIAGNOSIS — K449 Diaphragmatic hernia without obstruction or gangrene: Secondary | ICD-10-CM

## 2022-09-18 DIAGNOSIS — E119 Type 2 diabetes mellitus without complications: Secondary | ICD-10-CM | POA: Insufficient documentation

## 2022-09-18 DIAGNOSIS — I25119 Atherosclerotic heart disease of native coronary artery with unspecified angina pectoris: Secondary | ICD-10-CM

## 2022-09-18 DIAGNOSIS — K2270 Barrett's esophagus without dysplasia: Secondary | ICD-10-CM | POA: Diagnosis not present

## 2022-09-18 DIAGNOSIS — I700 Atherosclerosis of aorta: Secondary | ICD-10-CM | POA: Insufficient documentation

## 2022-09-18 DIAGNOSIS — K31A19 Gastric intestinal metaplasia without dysplasia, unspecified site: Secondary | ICD-10-CM | POA: Diagnosis not present

## 2022-09-18 DIAGNOSIS — Z7984 Long term (current) use of oral hypoglycemic drugs: Secondary | ICD-10-CM | POA: Diagnosis not present

## 2022-09-18 DIAGNOSIS — K222 Esophageal obstruction: Secondary | ICD-10-CM

## 2022-09-18 DIAGNOSIS — I119 Hypertensive heart disease without heart failure: Secondary | ICD-10-CM | POA: Diagnosis not present

## 2022-09-18 DIAGNOSIS — R634 Abnormal weight loss: Secondary | ICD-10-CM

## 2022-09-18 DIAGNOSIS — F419 Anxiety disorder, unspecified: Secondary | ICD-10-CM | POA: Diagnosis not present

## 2022-09-18 DIAGNOSIS — I2510 Atherosclerotic heart disease of native coronary artery without angina pectoris: Secondary | ICD-10-CM | POA: Diagnosis not present

## 2022-09-18 DIAGNOSIS — Z87891 Personal history of nicotine dependence: Secondary | ICD-10-CM

## 2022-09-18 DIAGNOSIS — I10 Essential (primary) hypertension: Secondary | ICD-10-CM | POA: Diagnosis not present

## 2022-09-18 DIAGNOSIS — Z79899 Other long term (current) drug therapy: Secondary | ICD-10-CM | POA: Diagnosis not present

## 2022-09-18 DIAGNOSIS — K219 Gastro-esophageal reflux disease without esophagitis: Secondary | ICD-10-CM

## 2022-09-18 DIAGNOSIS — R1310 Dysphagia, unspecified: Secondary | ICD-10-CM | POA: Insufficient documentation

## 2022-09-18 DIAGNOSIS — R6881 Early satiety: Secondary | ICD-10-CM

## 2022-09-18 HISTORY — PX: MALONEY DILATION: SHX5535

## 2022-09-18 HISTORY — PX: ESOPHAGOGASTRODUODENOSCOPY (EGD) WITH PROPOFOL: SHX5813

## 2022-09-18 HISTORY — PX: BIOPSY: SHX5522

## 2022-09-18 LAB — GLUCOSE, CAPILLARY: Glucose-Capillary: 107 mg/dL — ABNORMAL HIGH (ref 70–99)

## 2022-09-18 SURGERY — ESOPHAGOGASTRODUODENOSCOPY (EGD) WITH PROPOFOL
Anesthesia: General

## 2022-09-18 MED ORDER — LIDOCAINE HCL (CARDIAC) PF 100 MG/5ML IV SOSY
PREFILLED_SYRINGE | INTRAVENOUS | Status: DC | PRN
  Administered 2022-09-18: 50 mg via INTRAVENOUS

## 2022-09-18 MED ORDER — LACTATED RINGERS IV SOLN
INTRAVENOUS | Status: DC
  Administered 2022-09-18: 1000 mL via INTRAVENOUS

## 2022-09-18 MED ORDER — PHENYLEPHRINE HCL (PRESSORS) 10 MG/ML IV SOLN
INTRAVENOUS | Status: DC | PRN
  Administered 2022-09-18: 100 ug via INTRAVENOUS

## 2022-09-18 MED ORDER — PROPOFOL 10 MG/ML IV BOLUS
INTRAVENOUS | Status: DC | PRN
  Administered 2022-09-18: 100 mg via INTRAVENOUS

## 2022-09-18 MED ORDER — PROPOFOL 500 MG/50ML IV EMUL
INTRAVENOUS | Status: DC | PRN
  Administered 2022-09-18: 150 ug/kg/min via INTRAVENOUS

## 2022-09-18 NOTE — Op Note (Signed)
[facility] Patient Name: [name] Procedure Date: 09/18/2022 2:09 PM MRN: [id number] Date of Birth: 1945-10-01 Attending MD: Gennette Pac , MD, [id number] CSN: [id number] Age: 77 Admit Type: Outpatient Procedure:                Upper GI endoscopy Indications:              Dysphagia Providers:                Gennette Pac, MD, [place] Scrape RN,                            RN, Lennice Sites Technician, Technician Referring MD:              Medicines:                Propofol per Anesthesia Complications:            No immediate complications. Estimated Blood Loss:     Estimated blood loss was minimal. Procedure:                Pre-Anesthesia Assessment:                           - Prior to the procedure, a History and Physical                            was performed, and patient medications and                            allergies were reviewed. The patient's tolerance of                            previous anesthesia was also reviewed. The risks                            and benefits of the procedure and the sedation                            options and risks were discussed with the patient.                            All questions were answered, and informed consent                            was obtained. Prior Anticoagulants: The patient has                            taken no anticoagulant or antiplatelet agents. ASA                            Grade Assessment: III - A patient with severe                            systemic disease. After reviewing the risks and  benefits, the patient was deemed in satisfactory                            condition to undergo the procedure.                           After obtaining informed consent, the endoscope was                            passed under direct vision. Throughout the                            procedure, the patient's blood pressure, pulse, and                             oxygen saturations were monitored continuously. The                            GIF-H190 ([id number]) scope was introduced through the                            mouth, and advanced to the second part of duodenum.                            The upper GI endoscopy was accomplished without                            difficulty. The patient tolerated the procedure                            well. The upper GI endoscopy was accomplished                            without difficulty. The patient tolerated the                            procedure well. Scope In: 2:28:54 PM Scope Out: 2:39:10 PM Total Procedure Duration: 0 hours 10 minutes 16 seconds  Findings:      A small hiatal hernia was present. 2 small tongues of salmon Colored       epithelium coming up no more than 1 cm above the squamocolumnar       junction. Noncritical Schatzki's ring straddling the squamocolumnar       junction. No nodularity. No esophagitis.      Gastric cavity empty. . The duodenal bulb and second portion of the       duodenum were normal. Scope was withdrawn and a 56 [place] Maloney       dilators passed full insertion easily. A look back revealed no apparent       complication related to this maneuver. Finally the 2 short segments of       salmon-colored epithelium were biopsied. Impression:               - Minimal short segment Barrett's if that. May be  no more than the squamocolumnar junction. I did not                            see Barrett's esophagus extending up into the                            distal esophagus from the squamocolumnar junction.                            Small hiatal hernia.                           -Status post Maloney dilation. Small hiatal hernia.                            Otherwise normal stomach.                           - Normal duodenal bulb and second portion of the                            duodenum.                           - Moderate Sedation:       Moderate (conscious) sedation was personally administered by an       anesthesia professional. The following parameters were monitored: oxygen       saturation, heart rate, blood pressure, respiratory rate, EKG, adequacy       of pulmonary ventilation, and response to care. Recommendation:           - Patient has a contact number available for                            emergencies. The signs and symptoms of potential                            delayed complications were discussed with the                            patient. Return to normal activities tomorrow.                            Written discharge instructions were provided to the                            patient.                           - Advance diet as tolerated.                           - Continue present medications.                           - Return to my office in 1 month. Procedure Code(s):        ---  Professional ---                           [phone], Esophagogastroduodenoscopy, flexible,                            transoral; diagnostic, including collection of                            specimen(s) by brushing or washing, when performed                            (separate procedure) Diagnosis Code(s):        --- Professional ---                           K44.9, Diaphragmatic hernia without obstruction or                            gangrene                           R13.10, Dysphagia, unspecified CPT copyright 2022 American Medical Association. All rights reserved. The codes documented in this report are preliminary and upon coder review may  be revised to meet current compliance requirements. Gerrit Friends. Melissia Lahman, MD Gennette Pac, MD 09/18/2022 2:44:46 PM This report has been signed electronically. Number of Addenda: 0

## 2022-09-18 NOTE — Discharge Instructions (Signed)
EGD Discharge instructions Please read the instructions outlined below and refer to this sheet in the next few weeks. These discharge instructions provide you with general information on caring for yourself after you leave the hospital. Your doctor may also give you specific instructions. While your treatment has been planned according to the most current medical practices available, unavoidable complications occasionally occur. If you have any problems or questions after discharge, please call your doctor. ACTIVITY You may resume your regular activity but move at a slower pace for the next 24 hours.  Take frequent rest periods for the next 24 hours.  Walking will help expel (get rid of) the air and reduce the bloated feeling in your abdomen.  No driving for 24 hours (because of the anesthesia (medicine) used during the test).  You may shower.  Do not sign any important legal documents or operate any machinery for 24 hours (because of the anesthesia used during the test).  NUTRITION Drink plenty of fluids.  You may resume your normal diet.  Begin with a light meal and progress to your normal diet.  Avoid alcoholic beverages for 24 hours or as instructed by your caregiver.  MEDICATIONS You may resume your normal medications unless your caregiver tells you otherwise.  WHAT YOU CAN EXPECT TODAY You may experience abdominal discomfort such as a feeling of fullness or "gas" pains.  FOLLOW-UP Your doctor will discuss the results of your test with you.  SEEK IMMEDIATE MEDICAL ATTENTION IF ANY OF THE FOLLOWING OCCUR: Excessive nausea (feeling sick to your stomach) and/or vomiting.  Severe abdominal pain and distention (swelling).  Trouble swallowing.  Temperature over 101 F (37.8 C).  Rectal bleeding or vomiting of blood.      Your esophagus was stretched today.  Your esophagus was biopsied.  Further recommendations to follow pending review of pathology report   office visit with Neil Crouch in 3 months   at patient request, I called Jeffie Pollock at [phone]

## 2022-09-18 NOTE — Transfer of Care (Signed)
Immediate Anesthesia Transfer of Care Note  Patient: [name]  Procedure(s) Performed: ESOPHAGOGASTRODUODENOSCOPY (EGD) WITH PROPOFOL [place]  Patient Location: PACU  Anesthesia Type:General  Level of Consciousness: drowsy  Airway & Oxygen Therapy: Patient Spontanous Breathing and Patient connected to nasal cannula oxygen  Post-op Assessment: Report given to RN and Post -op Vital signs reviewed and stable  Post vital signs: Reviewed and stable  Last Vitals:  Vitals Value Taken Time  BP    Temp    Pulse    Resp    SpO2      Last Pain:  Vitals:   09/18/22 1427  TempSrc:   PainSc: 0-No pain      Patients Stated Pain Goal: 7 ([id number])  Complications: No notable events documented.

## 2022-09-18 NOTE — Anesthesia Postprocedure Evaluation (Signed)
Anesthesia Post Note  Patient: [name]  Procedure(s) Performed: ESOPHAGOGASTRODUODENOSCOPY (EGD) WITH PROPOFOL [place]  Patient location during evaluation: Phase II Anesthesia Type: General Level of consciousness: awake and alert and oriented Pain management: pain level controlled Vital Signs Assessment: post-procedure vital signs reviewed and stable Respiratory status: spontaneous breathing, nonlabored ventilation and respiratory function stable Cardiovascular status: blood pressure returned to baseline and stable Postop Assessment: no apparent nausea or vomiting Anesthetic complications: no  No notable events documented.   Last Vitals:  Vitals:   09/18/22 1500 09/18/22 1517  BP: 105/70 (!) 98/59  Pulse: 80 78  Resp: 17   Temp:  36.7 C  SpO2: 99% 98%    Last Pain:  Vitals:   09/18/22 1517  TempSrc: Oral  PainSc: 0-No pain                 Shafer Swamy C Andriy Sherk

## 2022-09-18 NOTE — Interval H&P Note (Signed)
History and Physical Interval Note:  09/18/2022 2:22 PM  [name]  has presented today for surgery, with the diagnosis of GERD.Barretts,wt loss,early satiety.  The various methods of treatment have been discussed with the patient and family. After consideration of risks, benefits and other options for treatment, the patient has consented to  Procedure(s) with comments: ESOPHAGOGASTRODUODENOSCOPY (EGD) WITH PROPOFOL (N/A) - 2:15 pm, asa 3 MALONEY DILATION (N/A) as a surgical intervention.  The patient's history has been reviewed, patient examined, no change in status, stable for surgery.  I have reviewed the patient's chart and labs.  Questions were answered to the patient's satisfaction.     Manus Rudd     Addendum patient not taking Plavix.

## 2022-09-18 NOTE — Anesthesia Preprocedure Evaluation (Signed)
Anesthesia Evaluation  Patient identified by MRN, date of birth, ID band Patient awake    Reviewed: Allergy & Precautions, H&P , NPO status , Patient's Chart, lab work & pertinent test results  Airway Mallampati: III  TM Distance: >3 FB Neck ROM: Full  Mouth opening: Limited Mouth Opening  Dental  (+) Dental Advisory Given, Chipped, Missing,    Pulmonary neg pulmonary ROS, former smoker   Pulmonary exam normal breath sounds clear to auscultation       Cardiovascular hypertension, Pt. on medications + angina  + CAD  Normal cardiovascular exam Rhythm:Regular Rate:Normal  1. Left ventricular ejection fraction, by estimation, is 55 to 60%. The  left ventricle has normal function. The left ventricle has no regional  wall motion abnormalities. There is mild left ventricular hypertrophy.  Left ventricular diastolic parameters  are consistent with Grade I diastolic dysfunction (impaired relaxation).   2. Right ventricular systolic function is normal. The right ventricular  size is normal. There is normal pulmonary artery systolic pressure.   3. The mitral valve is normal in structure. No evidence of mitral valve  regurgitation. No evidence of mitral stenosis.   4. The tricuspid valve is abnormal.   5. The aortic valve has an indeterminant number of cusps. There is mild  calcification of the aortic valve. There is mild thickening of the aortic  valve. Aortic valve regurgitation is not visualized. No aortic stenosis is  present.   FINDINGS: FFRct analysis was performed on the original cardiac CT angiogram dataset. Diagrammatic representation of the FFRct analysis is provided in a separate PDF document in PACS. This dictation was created using the PDF document and an interactive 3D model of the results. 3D model is not available in the EMR/PACS. Normal FFR range is >0.80.   1. Left Main: findings 0.98,  0.96   2. LAD: findings 0.95,  0.93 0.88; D2 0.89, 0.82   3. LCX: findings 0.96, 0.93 0.89; OM <0.5, <0.5 4. RCA: findings 0.85, 0.84 0.80   IMPRESSION: Findings suggestive of significant obstructive disease in OM and nonobstructive disease in mid LAD and D2.   Note: These examples are not recommendations of [employer] and only provided as examples of what other customers are doing.     Electronically Signed   By: Kirk Ruths M.D.   On: 08/16/2019 07:30       Neuro/Psych  PSYCHIATRIC DISORDERS Anxiety Depression    negative neurological ROS     GI/Hepatic Neg liver ROS,GERD  Medicated and Controlled,,  Endo/Other  diabetes, Well Controlled, Type 2, Oral Hypoglycemic Agents    Renal/GU negative Renal ROS  negative genitourinary   Musculoskeletal negative musculoskeletal ROS (+)    Abdominal   Peds negative pediatric ROS (+)  Hematology negative hematology ROS (+)   Anesthesia Other Findings Carotid body tumor -  MEDICAL ISSUES: I discussed the significance of this at length with the patient.  In looking at his old records, he did have this carotid body tumor in his CT scan from 2015.  At that time maximal size was 12 mm.  There is been minimal change since that time.  He also reports that this has been followed at the [facility] and at one point was being seen with ultrasound at 72-monthintervals.  He reports that that practitioner moves on and the different practitioners that he should have it imaged every 5 years.   Certainly do not think there is any indication for treatment.  He has nearly  10-year documentation of no change.  I would recommend ultrasound in 2 years to rule out any changes well.  He was referred to ear nose and throat as well.  I feel that this is not necessary since I feel it would be very unlikely that there would be any different recommendation ENT.  We will see him again in 2 years for continued follow-up     Rosetta Posner, MD FACS [facility] Tel [phone] Pager [phone]   Reproductive/Obstetrics negative OB ROS                             Anesthesia Physical Anesthesia Plan  ASA: 3  Anesthesia Plan: General   Post-op Pain Management: Minimal or no pain anticipated   Induction: Intravenous  PONV Risk Score and Plan: Propofol infusion  Airway Management Planned: Nasal Cannula and Natural Airway  Additional Equipment:   Intra-op Plan:   Post-operative Plan:   Informed Consent: I have reviewed the patients History and Physical, chart, labs and discussed the procedure including the risks, benefits and alternatives for the proposed anesthesia with the patient or authorized representative who has indicated his/her understanding and acceptance.     Dental advisory given  Plan Discussed with: CRNA and Surgeon  Anesthesia Plan Comments:         Anesthesia Quick Evaluation

## 2022-09-18 NOTE — Interval H&P Note (Signed)
History and Physical Interval Note:  09/18/2022 2:17 PM  [name]  has presented today for surgery, with the diagnosis of GERD.Barretts,wt loss,early satiety.  The various methods of treatment have been discussed with the patient and family. After consideration of risks, benefits and other options for treatment, the patient has consented to  Procedure(s) with comments: ESOPHAGOGASTRODUODENOSCOPY (EGD) WITH PROPOFOL (N/A) - 2:15 pm, asa 3 MALONEY DILATION (N/A) as a surgical intervention.  The patient's history has been reviewed, patient examined, no change in status, stable for surgery.  I have reviewed the patient's chart and labs.  Questions were answered to the patient's satisfaction.     [name]  \    No change.  5 to 6 cm length of Barrett's biopsied previously no evidence of dysplasia.  Here for further evaluation of dysphagia and surveillance.  I will offer the patient an EGD today.  The risks, benefits, limitations, alternatives and imponderables have been reviewed with the patient. Potential for esophageal dilation, biopsy, etc. have also been reviewed.  Questions have been answered. All parties agreeable.   He remains on Plavix per plan.

## 2022-09-22 LAB — SURGICAL PATHOLOGY

## 2022-09-24 ENCOUNTER — Encounter: Payer: Self-pay | Admitting: Gastroenterology

## 2022-09-26 ENCOUNTER — Encounter: Payer: Self-pay | Admitting: Internal Medicine

## 2022-10-01 ENCOUNTER — Encounter (HOSPITAL_COMMUNITY): Payer: Self-pay | Admitting: Internal Medicine

## 2022-10-02 DIAGNOSIS — Z96652 Presence of left artificial knee joint: Secondary | ICD-10-CM | POA: Diagnosis not present

## 2022-10-07 NOTE — Progress Notes (Unsigned)
GI Office Note    Referring Provider: Celene Squibb, MD Primary Care Physician:  Celene Squibb, MD  Primary Gastroenterologist: Garfield Cornea, MD   Chief Complaint   No chief complaint on file.   History of Present Illness   [name] is a 77 y.o. male presenting today for follow-up.  Last seen September 16, 2022.  He was scheduled for EGD to further evaluate early satiety, weight loss, Barrett's surveillance.  Prescribed MiraLAX for constipation.  EGD September 18, 2022: -Minimal short segment Barrett's if that.  May be no more than the squamocolumnar junction.  Biopsies were consistent for Barrett's esophagus, no dysplasia. -Small hiatal hernia -Status post Maloney dilation -Otherwise normal stomach -Normal duodenal bulb and second portion duodenum. -Return office visit in 4 weeks. -Recommend 1 more EGD in 3 years.     Medications   Current Outpatient Medications  Medication Sig Dispense Refill   amLODipine (NORVASC) 5 MG tablet TAKE ONE TABLET BY MOUTH ONCE DAILY. 90 tablet 3   aspirin EC 81 MG tablet Take 1 tablet (81 mg total) by mouth daily. 90 tablet 3   lisinopril (ZESTRIL) 40 MG tablet Take 40 mg by mouth daily.     loperamide (IMODIUM) 2 MG capsule Take 1 capsule (2 mg total) by mouth 4 (four) times daily as needed for diarrhea or loose stools. 12 capsule 0   metFORMIN (GLUCOPHAGE) 500 MG tablet Take 500 mg by mouth 2 (two) times daily with a meal.     omeprazole (PRILOSEC) 20 MG capsule Take 20 mg by mouth daily.     polyethylene glycol (MIRALAX / GLYCOLAX) 17 g packet Take one capful mixed in 4-6 ounces of water twice daily until soft stool, then continue once daily as needed. 14 each 0   rosuvastatin (CRESTOR) 20 MG tablet Take 20 mg by mouth daily.     sulfamethoxazole-trimethoprim (BACTRIM DS) 800-160 MG tablet Take 1 tablet by mouth every 12 (twelve) hours. 14 tablet 0   No current facility-administered medications for this visit.    Allergies    Allergies as of 10/08/2022 - Review Complete 09/18/2022  Allergen Reaction Noted   Simvastatin Rash 10/08/2010     Past Medical History   Past Medical History:  Diagnosis Date   Barrett esophagus    CAD (coronary artery disease)    Carotid body tumor ([place])    Depression    Diabetes mellitus without complication ([place])    Essential hypertension    GERD (gastroesophageal reflux disease)    Mixed hyperlipidemia    PTSD (post-traumatic stress disorder)     Past Surgical History   Past Surgical History:  Procedure Laterality Date   BIOPSY  09/18/2022   Procedure: BIOPSY;  Surgeon: Daneil Dolin, MD;  Location: [facility];  Service: Endoscopy;;   CARPAL TUNNEL RELEASE Bilateral    ESOPHAGOGASTRODUODENOSCOPY (EGD) WITH PROPOFOL N/A 09/18/2022   Procedure: ESOPHAGOGASTRODUODENOSCOPY (EGD) WITH PROPOFOL;  Surgeon: Daneil Dolin, MD;  Location: [facility];  Service: Endoscopy;  Laterality: N/A;  2:15 pm, asa 3   MALONEY DILATION N/A 09/18/2022   Procedure: MALONEY DILATION;  Surgeon: Daneil Dolin, MD;  Location: [facility];  Service: Endoscopy;  Laterality: N/A;   ORIF ANKLE FRACTURE Left    ORTHOPEDIC SURGERY     left knee arthroplasty    Past Family History   Family History  Problem Relation Age of Onset   CVA Father  Cerebral hemorrhage    Past Social History   Social History   Socioeconomic History   Marital status: Married    Spouse name: Not on file   Number of children: Not on file   Years of education: Not on file   Highest education level: Not on file  Occupational History   Not on file  Tobacco Use   Smoking status: Former    Packs/day: 1.00    Years: 2.00    Additional pack years: 0.00    Total pack years: 2.00    Types: Cigarettes    Quit date: 84    Years since quitting: 49.2   Smokeless tobacco: Never  Vaping Use   Vaping Use: Never used  Substance and Sexual Activity   Alcohol use: Never   Drug use: Never   Sexual  activity: Not Currently  Other Topics Concern   Not on file  Social History Narrative   Right handed   Lives with wife in one story home   Social Determinants of Health   Financial Resource Strain: Not on file  Food Insecurity: No Food Insecurity (04/22/2022)   Hunger Vital Sign    Worried About Running Out of Food in the Last Year: Never true    Ran Out of Food in the Last Year: Never true  Transportation Needs: No Transportation Needs (04/22/2022)   PRAPARE - Hydrologist (Medical): No    Lack of Transportation (Non-Medical): No  Physical Activity: Not on file  Stress: Not on file  Social Connections: Not on file  Intimate Partner Violence: Not At Risk (04/22/2022)   Humiliation, Afraid, Rape, and Kick questionnaire    Fear of Current or Ex-Partner: No    Emotionally Abused: No    Physically Abused: No    Sexually Abused: No    Review of Systems   General: Negative for anorexia, weight loss, fever, chills, fatigue, weakness. ENT: Negative for hoarseness, difficulty swallowing , nasal congestion. CV: Negative for chest pain, angina, palpitations, dyspnea on exertion, peripheral edema.  Respiratory: Negative for dyspnea at rest, dyspnea on exertion, cough, sputum, wheezing.  GI: See history of present illness. GU:  Negative for dysuria, hematuria, urinary incontinence, urinary frequency, nocturnal urination.  Endo: Negative for unusual weight change.     Physical Exam   There were no vitals taken for this visit.   General: Well-nourished, well-developed in no acute distress.  Eyes: No icterus. Mouth: Oropharyngeal mucosa moist and pink , no lesions erythema or exudate. Lungs: Clear to auscultation bilaterally.  Heart: Regular rate and rhythm, no murmurs rubs or gallops.  Abdomen: Bowel sounds are normal, nontender, nondistended, no hepatosplenomegaly or masses,  no abdominal bruits or hernia , no rebound or guarding.  Rectal: ***   Extremities: No lower extremity edema. No clubbing or deformities. Neuro: Alert and oriented x 4   Skin: Warm and dry, no jaundice.   Psych: Alert and cooperative, normal mood and affect.  Labs   *** Imaging Studies   No results found.  Assessment       PLAN   ***   Laureen Ochs. Bobby Rumpf, [place], [facility]

## 2022-10-08 ENCOUNTER — Ambulatory Visit (INDEPENDENT_AMBULATORY_CARE_PROVIDER_SITE_OTHER): Payer: Medicare Other | Admitting: Gastroenterology

## 2022-10-08 ENCOUNTER — Encounter: Payer: Self-pay | Admitting: Gastroenterology

## 2022-10-08 VITALS — BP 113/69 | HR 66 | Temp 98.4°F | Ht 65.0 in | Wt 188.8 lb

## 2022-10-08 DIAGNOSIS — R630 Anorexia: Secondary | ICD-10-CM | POA: Insufficient documentation

## 2022-10-08 DIAGNOSIS — K2270 Barrett's esophagus without dysplasia: Secondary | ICD-10-CM | POA: Diagnosis not present

## 2022-10-08 DIAGNOSIS — R634 Abnormal weight loss: Secondary | ICD-10-CM | POA: Diagnosis not present

## 2022-10-08 NOTE — Patient Instructions (Addendum)
Continue omeprazole 20mg  daily before breakfast. Take Miralax one capful twice daily until soft stool, then continue once daily to keep stools soft. Keep a check on your weight, if you drop more than five pounds please let us know. Dr. Gala Romney advises you to have another upper endoscopy in 3 years to check on the Barrett's esophagus. We will remind you. Please try to eat at least three meals and two snacks each day to maintain weight.  Return to our office as needed.

## 2022-11-28 ENCOUNTER — Telehealth: Payer: Self-pay | Admitting: *Deleted

## 2022-11-28 DIAGNOSIS — K219 Gastro-esophageal reflux disease without esophagitis: Secondary | ICD-10-CM | POA: Diagnosis not present

## 2022-11-28 DIAGNOSIS — I25119 Atherosclerotic heart disease of native coronary artery with unspecified angina pectoris: Secondary | ICD-10-CM | POA: Diagnosis not present

## 2022-11-28 DIAGNOSIS — Z8673 Personal history of transient ischemic attack (TIA), and cerebral infarction without residual deficits: Secondary | ICD-10-CM | POA: Diagnosis not present

## 2022-11-28 DIAGNOSIS — Z9182 Personal history of military deployment: Secondary | ICD-10-CM | POA: Diagnosis not present

## 2022-11-28 DIAGNOSIS — E119 Type 2 diabetes mellitus without complications: Secondary | ICD-10-CM | POA: Diagnosis not present

## 2022-11-28 DIAGNOSIS — I10 Essential (primary) hypertension: Secondary | ICD-10-CM | POA: Diagnosis not present

## 2022-11-28 DIAGNOSIS — E038 Other specified hypothyroidism: Secondary | ICD-10-CM | POA: Diagnosis not present

## 2022-11-28 DIAGNOSIS — Z01818 Encounter for other preprocedural examination: Secondary | ICD-10-CM | POA: Diagnosis not present

## 2022-11-28 DIAGNOSIS — E782 Mixed hyperlipidemia: Secondary | ICD-10-CM | POA: Diagnosis not present

## 2022-11-28 DIAGNOSIS — G8929 Other chronic pain: Secondary | ICD-10-CM | POA: Diagnosis not present

## 2022-11-28 DIAGNOSIS — M25572 Pain in left ankle and joints of left foot: Secondary | ICD-10-CM | POA: Diagnosis not present

## 2022-11-28 NOTE — Telephone Encounter (Signed)
Name: [name]  DOB: 1946/06/22  MRN: [id number]  Primary Cardiologist: Nona Dell, MD  Chart reviewed as part of pre-operative protocol coverage. Because of [name]'s past medical history and time since last visit, he will require a follow-up in-office visit in order to better assess preoperative cardiovascular risk.  Pre-op covering staff: - Please schedule appointment and call patient to inform them. If patient already had an upcoming appointment within acceptable timeframe, please add "pre-op clearance" to the appointment notes so provider is aware. - Please contact requesting surgeon's office via preferred method (i.e, phone, fax) to inform them of need for appointment prior to surgery.  No medications indicated as needing held.   Sharlene Dory, PA-C  11/28/2022, 2:16 PM

## 2022-11-28 NOTE — Telephone Encounter (Signed)
Pt has appt 12/02/22 with Dr. Diona Browner. I will update all parties involved.

## 2022-11-28 NOTE — Telephone Encounter (Signed)
PT ALREADY SCHEDULED TO SEE DR. MCDOWELL 12/02/22, CLEARANCE WILL BE ADDRESSED AT THAT TIME.  APPOINTMENT HAS BEEN UPDATED      Pre-operative Risk Assessment    Patient Name: [name]  DOB: 1946/06/28 MRN: [id number]      Request for Surgical Clearance    Procedure:  LEFT ANKLE ARTHROPLASTY  Date of Surgery:  Clearance 12/11/22                                 Surgeon:  DR. Pernell Dupre Surgeon's Group or Practice Name:  [facility] PRE ANESTHESIA  Phone number:  [phone] Fax number:  [phone]   Type of Clearance Requested:   - Medical    Type of Anesthesia:  Not Indicated   Additional requests/questions:    Wilhemina Cash   11/28/2022, 2:00 PM

## 2022-12-01 NOTE — Progress Notes (Signed)
Cardiology Office Note  Date: 12/02/2022   ID: [name], DOB 1946-06-22, MRN [id number]  History of Present Illness: [name] is a 77 y.o. male last seen in the office back in February 2022 by Ms. Duke PA-C, our last visit was in 2021.  He presents to the office for follow-up, and also preoperative cardiac assessment prior to elective left ankle arthroplasty, type of anesthesia not indicated.  He is under the impression that a "nerve block" with sedation is being considered.  From a cardiac perspective he reports no exertional chest pain, NYHA class II dyspnea with typical activities.  He is limited by ankle pain, uses a cane.  Denies any recent falls.  No palpitations or syncope.  ECG today shows sinus bradycardia with LVH.  I reviewed his medications which include aspirin, Norvasc, lisinopril, and Crestor.  He continues to follow regularly with Dr. Margo Aye.  Last lipid panel showed LDL 69 on Crestor 20 mg daily.  Renal function and hemoglobin normal as of December 2023.  RCRI perioperative cardiac risk index is class II, 0.9% chance of major adverse cardiac event.  Physical Exam: VS:  BP (!) 142/82   Pulse 66   Ht 5\' 5"  (1.651 m)   Wt 191 lb (86.6 kg)   SpO2 99%   BMI 31.78 kg/m , BMI Body mass index is 31.78 kg/m.  Wt Readings from Last 3 Encounters:  12/02/22 191 lb (86.6 kg)  10/08/22 188 lb 12.8 oz (85.6 kg)  09/16/22 189 lb 9.6 oz (86 kg)    General: Patient appears comfortable at rest. HEENT: Conjunctiva and lids normal. Neck: Supple, no elevated JVP or carotid bruits. Lungs: Clear to auscultation, nonlabored breathing at rest. Cardiac: Regular rate and rhythm, no S3, 1/6 systolic murmur, no pericardial rub. Abdomen: Soft, bowel sounds present. Extremities: No ankle edema.  ECG:  An ECG dated 04/18/2022 was personally reviewed today and demonstrated:  Sinus rhythm with Q in lead III.  Labwork: 04/18/2022: B Natriuretic Peptide 60.0 04/22/2022: TSH  3.523 04/23/2022: Magnesium 2.2 07/11/2022: ALT 20; AST 26; BUN 10; Creatinine, Ser 1.06; Hemoglobin 14.0; Platelets 209; Potassium 3.9; Sodium 141     Component Value Date/Time   CHOL 127 04/22/2022 0629   TRIG 130 04/22/2022 0629   HDL 32 (L) 04/22/2022 0629   CHOLHDL 4.0 04/22/2022 0629   VLDL 26 04/22/2022 0629   LDLCALC 69 04/22/2022 0629   Other Studies Reviewed Today:  Echocardiogram 04/22/2022:  1. Left ventricular ejection fraction, by estimation, is 55 to 60%. The  left ventricle has normal function. The left ventricle has no regional  wall motion abnormalities. There is mild left ventricular hypertrophy.  Left ventricular diastolic parameters  are consistent with Grade I diastolic dysfunction (impaired relaxation).   2. Right ventricular systolic function is normal. The right ventricular  size is normal. There is normal pulmonary artery systolic pressure.   3. The mitral valve is normal in structure. No evidence of mitral valve  regurgitation. No evidence of mitral stenosis.   4. The tricuspid valve is abnormal.   5. The aortic valve has an indeterminant number of cusps. There is mild  calcification of the aortic valve. There is mild thickening of the aortic  valve. Aortic valve regurgitation is not visualized. No aortic stenosis is  present.   Assessment and Plan:  1.  Preoperative cardiac evaluation in a 77 year old male with branch vessel CAD that has been symptomatically stable on medical therapy, hypertension, and hyperlipidemia.  He is being considered for left ankle arthroplasty presumably with nerve block and IV sedation rather than general anesthesia per discussion with him today.  RCRI perioperative cardiac risk index is class II, 0.9% chance of major adverse cardiac event.  ECG reviewed and stable.  LVEF 55 to 60% by echocardiogram in October 2023.  I anticipate that he should be able to proceed with planned operation without further cardiac testing.  If necessary,  aspirin may be held 7 days prior.  2.  Coronary calcium score of 433 with obstructive stenosis involving OM branch by FFR imaging but otherwise nonobstructive disease in the major epicardials as of coronary CTA in January 2021.  LVEF 55 to 60% by echocardiogram in October 2023.  He reports no active angina on medical therapy including aspirin, Norvasc, lisinopril, and Crestor.  3.  Mixed hyperlipidemia, LDL 69 in October 2023.  Continue Crestor 20 mg daily.  4.  Essential hypertension.  No changes made to present regimen.  Keep follow-up with Dr. Margo Aye.  Disposition:  Follow up  6 months.  Signed, Jonelle Sidle, M.D., F.A.C.C. [facility] at [facility]

## 2022-12-02 ENCOUNTER — Encounter: Payer: Self-pay | Admitting: Cardiology

## 2022-12-02 ENCOUNTER — Ambulatory Visit: Payer: No Typology Code available for payment source | Attending: Cardiology | Admitting: Cardiology

## 2022-12-02 VITALS — BP 132/82 | HR 66 | Ht 65.0 in | Wt 191.0 lb

## 2022-12-02 DIAGNOSIS — Z01810 Encounter for preprocedural cardiovascular examination: Secondary | ICD-10-CM | POA: Diagnosis not present

## 2022-12-02 DIAGNOSIS — E782 Mixed hyperlipidemia: Secondary | ICD-10-CM | POA: Diagnosis not present

## 2022-12-02 DIAGNOSIS — I25119 Atherosclerotic heart disease of native coronary artery with unspecified angina pectoris: Secondary | ICD-10-CM

## 2022-12-02 NOTE — Patient Instructions (Signed)
Medication Instructions:  Your physician recommends that you continue on your current medications as directed. Please refer to the Current Medication list given to you today.   Labwork: None today  Testing/Procedures: None today  Follow-Up: 6 months  Any Other Special Instructions Will Be Listed Below (If Applicable).  If you need a refill on your cardiac medications before your next appointment, please call your pharmacy.  

## 2022-12-04 NOTE — Telephone Encounter (Signed)
Office is calling for update. Please advise  

## 2022-12-04 NOTE — Telephone Encounter (Signed)
I tried to call the number that was listed on the clearance form the reach the requesting office, though no answer. I then saw notes in the chart from Dr. Pernell Dupre and saw a different ph and fax#.  Called the office and confirmed the fax#. The correct fax# is [phone]. The ph# I called was also [phone]. I thanked the office for their help and I will fax notes the fax # I have been given now.

## 2022-12-04 NOTE — Telephone Encounter (Signed)
I will fax Dr. Ival Bible ov notes reflecting pre op clearance and ASA hold if needed.

## 2022-12-11 DIAGNOSIS — Z743 Need for continuous supervision: Secondary | ICD-10-CM | POA: Diagnosis not present

## 2022-12-11 DIAGNOSIS — M24572 Contracture, left ankle: Secondary | ICD-10-CM | POA: Diagnosis not present

## 2022-12-11 DIAGNOSIS — Z8673 Personal history of transient ischemic attack (TIA), and cerebral infarction without residual deficits: Secondary | ICD-10-CM | POA: Diagnosis not present

## 2022-12-11 DIAGNOSIS — I10 Essential (primary) hypertension: Secondary | ICD-10-CM | POA: Diagnosis not present

## 2022-12-11 DIAGNOSIS — E039 Hypothyroidism, unspecified: Secondary | ICD-10-CM | POA: Diagnosis not present

## 2022-12-11 DIAGNOSIS — Z85828 Personal history of other malignant neoplasm of skin: Secondary | ICD-10-CM | POA: Diagnosis not present

## 2022-12-11 DIAGNOSIS — Z751 Person awaiting admission to adequate facility elsewhere: Secondary | ICD-10-CM | POA: Diagnosis not present

## 2022-12-11 DIAGNOSIS — N189 Chronic kidney disease, unspecified: Secondary | ICD-10-CM | POA: Diagnosis not present

## 2022-12-11 DIAGNOSIS — D62 Acute posthemorrhagic anemia: Secondary | ICD-10-CM | POA: Diagnosis not present

## 2022-12-11 DIAGNOSIS — G8918 Other acute postprocedural pain: Secondary | ICD-10-CM | POA: Diagnosis not present

## 2022-12-11 DIAGNOSIS — Z9182 Personal history of military deployment: Secondary | ICD-10-CM | POA: Diagnosis not present

## 2022-12-11 DIAGNOSIS — E119 Type 2 diabetes mellitus without complications: Secondary | ICD-10-CM | POA: Diagnosis not present

## 2022-12-11 DIAGNOSIS — I25119 Atherosclerotic heart disease of native coronary artery with unspecified angina pectoris: Secondary | ICD-10-CM | POA: Diagnosis not present

## 2022-12-11 DIAGNOSIS — I129 Hypertensive chronic kidney disease with stage 1 through stage 4 chronic kidney disease, or unspecified chronic kidney disease: Secondary | ICD-10-CM | POA: Diagnosis not present

## 2022-12-11 DIAGNOSIS — I69398 Other sequelae of cerebral infarction: Secondary | ICD-10-CM | POA: Diagnosis not present

## 2022-12-11 DIAGNOSIS — R531 Weakness: Secondary | ICD-10-CM | POA: Diagnosis not present

## 2022-12-11 DIAGNOSIS — I2510 Atherosclerotic heart disease of native coronary artery without angina pectoris: Secondary | ICD-10-CM | POA: Diagnosis not present

## 2022-12-11 DIAGNOSIS — E1169 Type 2 diabetes mellitus with other specified complication: Secondary | ICD-10-CM | POA: Diagnosis not present

## 2022-12-11 DIAGNOSIS — K5900 Constipation, unspecified: Secondary | ICD-10-CM | POA: Diagnosis not present

## 2022-12-11 DIAGNOSIS — Z96662 Presence of left artificial ankle joint: Secondary | ICD-10-CM | POA: Diagnosis not present

## 2022-12-11 DIAGNOSIS — J385 Laryngeal spasm: Secondary | ICD-10-CM | POA: Diagnosis not present

## 2022-12-11 DIAGNOSIS — R279 Unspecified lack of coordination: Secondary | ICD-10-CM | POA: Diagnosis not present

## 2022-12-11 DIAGNOSIS — Z96652 Presence of left artificial knee joint: Secondary | ICD-10-CM | POA: Diagnosis not present

## 2022-12-11 DIAGNOSIS — E1122 Type 2 diabetes mellitus with diabetic chronic kidney disease: Secondary | ICD-10-CM | POA: Diagnosis not present

## 2022-12-11 DIAGNOSIS — E782 Mixed hyperlipidemia: Secondary | ICD-10-CM | POA: Diagnosis not present

## 2022-12-11 DIAGNOSIS — M19072 Primary osteoarthritis, left ankle and foot: Secondary | ICD-10-CM | POA: Diagnosis not present

## 2022-12-11 DIAGNOSIS — K219 Gastro-esophageal reflux disease without esophagitis: Secondary | ICD-10-CM | POA: Diagnosis not present

## 2022-12-11 DIAGNOSIS — E559 Vitamin D deficiency, unspecified: Secondary | ICD-10-CM | POA: Diagnosis not present

## 2022-12-11 DIAGNOSIS — F32A Depression, unspecified: Secondary | ICD-10-CM | POA: Diagnosis not present

## 2022-12-11 DIAGNOSIS — S82892D Other fracture of left lower leg, subsequent encounter for closed fracture with routine healing: Secondary | ICD-10-CM | POA: Diagnosis not present

## 2022-12-11 DIAGNOSIS — Z471 Aftercare following joint replacement surgery: Secondary | ICD-10-CM | POA: Diagnosis not present

## 2022-12-11 DIAGNOSIS — Z8616 Personal history of COVID-19: Secondary | ICD-10-CM | POA: Diagnosis not present

## 2022-12-11 DIAGNOSIS — M6281 Muscle weakness (generalized): Secondary | ICD-10-CM | POA: Diagnosis not present

## 2022-12-11 DIAGNOSIS — R2689 Other abnormalities of gait and mobility: Secondary | ICD-10-CM | POA: Diagnosis not present

## 2022-12-11 DIAGNOSIS — E1165 Type 2 diabetes mellitus with hyperglycemia: Secondary | ICD-10-CM | POA: Diagnosis not present

## 2022-12-11 DIAGNOSIS — Z96661 Presence of right artificial ankle joint: Secondary | ICD-10-CM | POA: Diagnosis not present

## 2022-12-12 DIAGNOSIS — Z8673 Personal history of transient ischemic attack (TIA), and cerebral infarction without residual deficits: Secondary | ICD-10-CM | POA: Diagnosis not present

## 2022-12-12 DIAGNOSIS — M19072 Primary osteoarthritis, left ankle and foot: Secondary | ICD-10-CM | POA: Diagnosis not present

## 2022-12-12 DIAGNOSIS — E039 Hypothyroidism, unspecified: Secondary | ICD-10-CM | POA: Diagnosis not present

## 2022-12-12 DIAGNOSIS — I10 Essential (primary) hypertension: Secondary | ICD-10-CM | POA: Diagnosis not present

## 2022-12-16 DIAGNOSIS — E119 Type 2 diabetes mellitus without complications: Secondary | ICD-10-CM | POA: Diagnosis not present

## 2022-12-16 DIAGNOSIS — K5900 Constipation, unspecified: Secondary | ICD-10-CM | POA: Diagnosis not present

## 2022-12-16 DIAGNOSIS — I25119 Atherosclerotic heart disease of native coronary artery with unspecified angina pectoris: Secondary | ICD-10-CM | POA: Diagnosis not present

## 2022-12-16 DIAGNOSIS — I10 Essential (primary) hypertension: Secondary | ICD-10-CM | POA: Diagnosis not present

## 2022-12-17 ENCOUNTER — Ambulatory Visit: Payer: No Typology Code available for payment source | Admitting: Urology

## 2022-12-17 DIAGNOSIS — E119 Type 2 diabetes mellitus without complications: Secondary | ICD-10-CM | POA: Diagnosis not present

## 2022-12-17 DIAGNOSIS — I10 Essential (primary) hypertension: Secondary | ICD-10-CM | POA: Diagnosis not present

## 2022-12-17 DIAGNOSIS — K5900 Constipation, unspecified: Secondary | ICD-10-CM | POA: Diagnosis not present

## 2022-12-17 DIAGNOSIS — I25119 Atherosclerotic heart disease of native coronary artery with unspecified angina pectoris: Secondary | ICD-10-CM | POA: Diagnosis not present

## 2022-12-18 DIAGNOSIS — I25119 Atherosclerotic heart disease of native coronary artery with unspecified angina pectoris: Secondary | ICD-10-CM | POA: Diagnosis not present

## 2022-12-18 DIAGNOSIS — E119 Type 2 diabetes mellitus without complications: Secondary | ICD-10-CM | POA: Diagnosis not present

## 2022-12-18 DIAGNOSIS — I10 Essential (primary) hypertension: Secondary | ICD-10-CM | POA: Diagnosis not present

## 2022-12-19 DIAGNOSIS — Z743 Need for continuous supervision: Secondary | ICD-10-CM | POA: Diagnosis not present

## 2022-12-19 DIAGNOSIS — R531 Weakness: Secondary | ICD-10-CM | POA: Diagnosis not present

## 2022-12-19 DIAGNOSIS — R279 Unspecified lack of coordination: Secondary | ICD-10-CM | POA: Diagnosis not present

## 2022-12-19 DIAGNOSIS — Z96661 Presence of right artificial ankle joint: Secondary | ICD-10-CM | POA: Diagnosis not present

## 2022-12-19 DIAGNOSIS — E1169 Type 2 diabetes mellitus with other specified complication: Secondary | ICD-10-CM | POA: Diagnosis not present

## 2022-12-19 DIAGNOSIS — E782 Mixed hyperlipidemia: Secondary | ICD-10-CM | POA: Diagnosis not present

## 2022-12-19 DIAGNOSIS — M19172 Post-traumatic osteoarthritis, left ankle and foot: Secondary | ICD-10-CM | POA: Diagnosis not present

## 2022-12-19 DIAGNOSIS — M6281 Muscle weakness (generalized): Secondary | ICD-10-CM | POA: Diagnosis not present

## 2022-12-19 DIAGNOSIS — Z96662 Presence of left artificial ankle joint: Secondary | ICD-10-CM | POA: Diagnosis not present

## 2022-12-19 DIAGNOSIS — E119 Type 2 diabetes mellitus without complications: Secondary | ICD-10-CM | POA: Diagnosis not present

## 2022-12-19 DIAGNOSIS — I2510 Atherosclerotic heart disease of native coronary artery without angina pectoris: Secondary | ICD-10-CM | POA: Diagnosis not present

## 2022-12-19 DIAGNOSIS — F32A Depression, unspecified: Secondary | ICD-10-CM | POA: Diagnosis not present

## 2022-12-19 DIAGNOSIS — I10 Essential (primary) hypertension: Secondary | ICD-10-CM | POA: Diagnosis not present

## 2022-12-19 DIAGNOSIS — R2689 Other abnormalities of gait and mobility: Secondary | ICD-10-CM | POA: Diagnosis not present

## 2022-12-19 DIAGNOSIS — E039 Hypothyroidism, unspecified: Secondary | ICD-10-CM | POA: Diagnosis not present

## 2022-12-19 DIAGNOSIS — Z471 Aftercare following joint replacement surgery: Secondary | ICD-10-CM | POA: Diagnosis not present

## 2022-12-19 DIAGNOSIS — K219 Gastro-esophageal reflux disease without esophagitis: Secondary | ICD-10-CM | POA: Diagnosis not present

## 2022-12-19 DIAGNOSIS — M19072 Primary osteoarthritis, left ankle and foot: Secondary | ICD-10-CM | POA: Diagnosis not present

## 2023-01-12 DIAGNOSIS — Z602 Problems related to living alone: Secondary | ICD-10-CM | POA: Diagnosis not present

## 2023-01-12 DIAGNOSIS — Z7982 Long term (current) use of aspirin: Secondary | ICD-10-CM | POA: Diagnosis not present

## 2023-01-12 DIAGNOSIS — I10 Essential (primary) hypertension: Secondary | ICD-10-CM | POA: Diagnosis not present

## 2023-01-12 DIAGNOSIS — Z7901 Long term (current) use of anticoagulants: Secondary | ICD-10-CM | POA: Diagnosis not present

## 2023-01-12 DIAGNOSIS — K219 Gastro-esophageal reflux disease without esophagitis: Secondary | ICD-10-CM | POA: Diagnosis not present

## 2023-01-12 DIAGNOSIS — Z741 Need for assistance with personal care: Secondary | ICD-10-CM | POA: Diagnosis not present

## 2023-01-12 DIAGNOSIS — E785 Hyperlipidemia, unspecified: Secondary | ICD-10-CM | POA: Diagnosis not present

## 2023-01-12 DIAGNOSIS — Z471 Aftercare following joint replacement surgery: Secondary | ICD-10-CM | POA: Diagnosis not present

## 2023-01-12 DIAGNOSIS — Z96662 Presence of left artificial ankle joint: Secondary | ICD-10-CM | POA: Diagnosis not present

## 2023-01-12 DIAGNOSIS — Z9181 History of falling: Secondary | ICD-10-CM | POA: Diagnosis not present

## 2023-01-12 DIAGNOSIS — Z7984 Long term (current) use of oral hypoglycemic drugs: Secondary | ICD-10-CM | POA: Diagnosis not present

## 2023-01-12 DIAGNOSIS — I2510 Atherosclerotic heart disease of native coronary artery without angina pectoris: Secondary | ICD-10-CM | POA: Diagnosis not present

## 2023-01-12 DIAGNOSIS — E119 Type 2 diabetes mellitus without complications: Secondary | ICD-10-CM | POA: Diagnosis not present

## 2023-01-12 DIAGNOSIS — F32A Depression, unspecified: Secondary | ICD-10-CM | POA: Diagnosis not present

## 2023-01-14 DIAGNOSIS — I2510 Atherosclerotic heart disease of native coronary artery without angina pectoris: Secondary | ICD-10-CM | POA: Diagnosis not present

## 2023-01-14 DIAGNOSIS — Z602 Problems related to living alone: Secondary | ICD-10-CM | POA: Diagnosis not present

## 2023-01-14 DIAGNOSIS — I10 Essential (primary) hypertension: Secondary | ICD-10-CM | POA: Diagnosis not present

## 2023-01-14 DIAGNOSIS — E119 Type 2 diabetes mellitus without complications: Secondary | ICD-10-CM | POA: Diagnosis not present

## 2023-01-14 DIAGNOSIS — Z7982 Long term (current) use of aspirin: Secondary | ICD-10-CM | POA: Diagnosis not present

## 2023-01-14 DIAGNOSIS — Z741 Need for assistance with personal care: Secondary | ICD-10-CM | POA: Diagnosis not present

## 2023-01-14 DIAGNOSIS — Z9181 History of falling: Secondary | ICD-10-CM | POA: Diagnosis not present

## 2023-01-14 DIAGNOSIS — Z471 Aftercare following joint replacement surgery: Secondary | ICD-10-CM | POA: Diagnosis not present

## 2023-01-14 DIAGNOSIS — Z7901 Long term (current) use of anticoagulants: Secondary | ICD-10-CM | POA: Diagnosis not present

## 2023-01-14 DIAGNOSIS — Z7984 Long term (current) use of oral hypoglycemic drugs: Secondary | ICD-10-CM | POA: Diagnosis not present

## 2023-01-14 DIAGNOSIS — K219 Gastro-esophageal reflux disease without esophagitis: Secondary | ICD-10-CM | POA: Diagnosis not present

## 2023-01-14 DIAGNOSIS — E785 Hyperlipidemia, unspecified: Secondary | ICD-10-CM | POA: Diagnosis not present

## 2023-01-14 DIAGNOSIS — Z96662 Presence of left artificial ankle joint: Secondary | ICD-10-CM | POA: Diagnosis not present

## 2023-01-14 DIAGNOSIS — F32A Depression, unspecified: Secondary | ICD-10-CM | POA: Diagnosis not present

## 2023-01-15 DIAGNOSIS — Z9181 History of falling: Secondary | ICD-10-CM | POA: Diagnosis not present

## 2023-01-15 DIAGNOSIS — E785 Hyperlipidemia, unspecified: Secondary | ICD-10-CM | POA: Diagnosis not present

## 2023-01-15 DIAGNOSIS — Z7984 Long term (current) use of oral hypoglycemic drugs: Secondary | ICD-10-CM | POA: Diagnosis not present

## 2023-01-15 DIAGNOSIS — K219 Gastro-esophageal reflux disease without esophagitis: Secondary | ICD-10-CM | POA: Diagnosis not present

## 2023-01-15 DIAGNOSIS — I2510 Atherosclerotic heart disease of native coronary artery without angina pectoris: Secondary | ICD-10-CM | POA: Diagnosis not present

## 2023-01-15 DIAGNOSIS — F32A Depression, unspecified: Secondary | ICD-10-CM | POA: Diagnosis not present

## 2023-01-15 DIAGNOSIS — Z7982 Long term (current) use of aspirin: Secondary | ICD-10-CM | POA: Diagnosis not present

## 2023-01-15 DIAGNOSIS — Z741 Need for assistance with personal care: Secondary | ICD-10-CM | POA: Diagnosis not present

## 2023-01-15 DIAGNOSIS — I10 Essential (primary) hypertension: Secondary | ICD-10-CM | POA: Diagnosis not present

## 2023-01-15 DIAGNOSIS — Z96662 Presence of left artificial ankle joint: Secondary | ICD-10-CM | POA: Diagnosis not present

## 2023-01-15 DIAGNOSIS — Z7901 Long term (current) use of anticoagulants: Secondary | ICD-10-CM | POA: Diagnosis not present

## 2023-01-15 DIAGNOSIS — Z471 Aftercare following joint replacement surgery: Secondary | ICD-10-CM | POA: Diagnosis not present

## 2023-01-15 DIAGNOSIS — E119 Type 2 diabetes mellitus without complications: Secondary | ICD-10-CM | POA: Diagnosis not present

## 2023-01-15 DIAGNOSIS — Z602 Problems related to living alone: Secondary | ICD-10-CM | POA: Diagnosis not present

## 2023-01-15 DIAGNOSIS — Z8673 Personal history of transient ischemic attack (TIA), and cerebral infarction without residual deficits: Secondary | ICD-10-CM | POA: Diagnosis not present

## 2023-01-16 DIAGNOSIS — F32A Depression, unspecified: Secondary | ICD-10-CM | POA: Diagnosis not present

## 2023-01-16 DIAGNOSIS — Z9181 History of falling: Secondary | ICD-10-CM | POA: Diagnosis not present

## 2023-01-16 DIAGNOSIS — Z602 Problems related to living alone: Secondary | ICD-10-CM | POA: Diagnosis not present

## 2023-01-16 DIAGNOSIS — K219 Gastro-esophageal reflux disease without esophagitis: Secondary | ICD-10-CM | POA: Diagnosis not present

## 2023-01-16 DIAGNOSIS — I2510 Atherosclerotic heart disease of native coronary artery without angina pectoris: Secondary | ICD-10-CM | POA: Diagnosis not present

## 2023-01-16 DIAGNOSIS — Z96662 Presence of left artificial ankle joint: Secondary | ICD-10-CM | POA: Diagnosis not present

## 2023-01-16 DIAGNOSIS — Z7901 Long term (current) use of anticoagulants: Secondary | ICD-10-CM | POA: Diagnosis not present

## 2023-01-16 DIAGNOSIS — Z7982 Long term (current) use of aspirin: Secondary | ICD-10-CM | POA: Diagnosis not present

## 2023-01-16 DIAGNOSIS — E785 Hyperlipidemia, unspecified: Secondary | ICD-10-CM | POA: Diagnosis not present

## 2023-01-16 DIAGNOSIS — Z741 Need for assistance with personal care: Secondary | ICD-10-CM | POA: Diagnosis not present

## 2023-01-16 DIAGNOSIS — Z471 Aftercare following joint replacement surgery: Secondary | ICD-10-CM | POA: Diagnosis not present

## 2023-01-16 DIAGNOSIS — Z7984 Long term (current) use of oral hypoglycemic drugs: Secondary | ICD-10-CM | POA: Diagnosis not present

## 2023-01-16 DIAGNOSIS — E119 Type 2 diabetes mellitus without complications: Secondary | ICD-10-CM | POA: Diagnosis not present

## 2023-01-16 DIAGNOSIS — I10 Essential (primary) hypertension: Secondary | ICD-10-CM | POA: Diagnosis not present

## 2023-01-19 DIAGNOSIS — Z7901 Long term (current) use of anticoagulants: Secondary | ICD-10-CM | POA: Diagnosis not present

## 2023-01-19 DIAGNOSIS — Z741 Need for assistance with personal care: Secondary | ICD-10-CM | POA: Diagnosis not present

## 2023-01-19 DIAGNOSIS — Z7984 Long term (current) use of oral hypoglycemic drugs: Secondary | ICD-10-CM | POA: Diagnosis not present

## 2023-01-19 DIAGNOSIS — F32A Depression, unspecified: Secondary | ICD-10-CM | POA: Diagnosis not present

## 2023-01-19 DIAGNOSIS — E119 Type 2 diabetes mellitus without complications: Secondary | ICD-10-CM | POA: Diagnosis not present

## 2023-01-19 DIAGNOSIS — I2510 Atherosclerotic heart disease of native coronary artery without angina pectoris: Secondary | ICD-10-CM | POA: Diagnosis not present

## 2023-01-19 DIAGNOSIS — Z7982 Long term (current) use of aspirin: Secondary | ICD-10-CM | POA: Diagnosis not present

## 2023-01-19 DIAGNOSIS — K219 Gastro-esophageal reflux disease without esophagitis: Secondary | ICD-10-CM | POA: Diagnosis not present

## 2023-01-19 DIAGNOSIS — Z96662 Presence of left artificial ankle joint: Secondary | ICD-10-CM | POA: Diagnosis not present

## 2023-01-19 DIAGNOSIS — E785 Hyperlipidemia, unspecified: Secondary | ICD-10-CM | POA: Diagnosis not present

## 2023-01-19 DIAGNOSIS — Z9181 History of falling: Secondary | ICD-10-CM | POA: Diagnosis not present

## 2023-01-19 DIAGNOSIS — Z471 Aftercare following joint replacement surgery: Secondary | ICD-10-CM | POA: Diagnosis not present

## 2023-01-19 DIAGNOSIS — I10 Essential (primary) hypertension: Secondary | ICD-10-CM | POA: Diagnosis not present

## 2023-01-19 DIAGNOSIS — Z602 Problems related to living alone: Secondary | ICD-10-CM | POA: Diagnosis not present

## 2023-01-21 DIAGNOSIS — Z602 Problems related to living alone: Secondary | ICD-10-CM | POA: Diagnosis not present

## 2023-01-21 DIAGNOSIS — F32A Depression, unspecified: Secondary | ICD-10-CM | POA: Diagnosis not present

## 2023-01-21 DIAGNOSIS — Z7982 Long term (current) use of aspirin: Secondary | ICD-10-CM | POA: Diagnosis not present

## 2023-01-21 DIAGNOSIS — Z7901 Long term (current) use of anticoagulants: Secondary | ICD-10-CM | POA: Diagnosis not present

## 2023-01-21 DIAGNOSIS — I2510 Atherosclerotic heart disease of native coronary artery without angina pectoris: Secondary | ICD-10-CM | POA: Diagnosis not present

## 2023-01-21 DIAGNOSIS — I10 Essential (primary) hypertension: Secondary | ICD-10-CM | POA: Diagnosis not present

## 2023-01-21 DIAGNOSIS — K219 Gastro-esophageal reflux disease without esophagitis: Secondary | ICD-10-CM | POA: Diagnosis not present

## 2023-01-21 DIAGNOSIS — Z741 Need for assistance with personal care: Secondary | ICD-10-CM | POA: Diagnosis not present

## 2023-01-21 DIAGNOSIS — E785 Hyperlipidemia, unspecified: Secondary | ICD-10-CM | POA: Diagnosis not present

## 2023-01-21 DIAGNOSIS — E119 Type 2 diabetes mellitus without complications: Secondary | ICD-10-CM | POA: Diagnosis not present

## 2023-01-21 DIAGNOSIS — Z471 Aftercare following joint replacement surgery: Secondary | ICD-10-CM | POA: Diagnosis not present

## 2023-01-21 DIAGNOSIS — Z9181 History of falling: Secondary | ICD-10-CM | POA: Diagnosis not present

## 2023-01-21 DIAGNOSIS — Z96662 Presence of left artificial ankle joint: Secondary | ICD-10-CM | POA: Diagnosis not present

## 2023-01-21 DIAGNOSIS — Z7984 Long term (current) use of oral hypoglycemic drugs: Secondary | ICD-10-CM | POA: Diagnosis not present

## 2023-01-22 DIAGNOSIS — T8131XA Disruption of external operation (surgical) wound, not elsewhere classified, initial encounter: Secondary | ICD-10-CM | POA: Diagnosis not present

## 2023-01-22 DIAGNOSIS — L97529 Non-pressure chronic ulcer of other part of left foot with unspecified severity: Secondary | ICD-10-CM | POA: Diagnosis not present

## 2023-01-23 IMAGING — MR MR HEAD W/O CM
13 of 14 series · 37 of 48 positions shown · non-contrast
Comparison: None.

CLINICAL DATA: Off balance for year

EXAM:
MRI HEAD WITHOUT CONTRAST
TECHNIQUE: Multiplanar, multiecho pulse sequences of the brain and surrounding
structures were obtained without intravenous contrast.

[Series 5: DWI · axial · 3.0mm · 0.77mm/px · z∈[-67,+74]mm · 4 of 48 slices shown (1 of 6)]
[im 1/48]
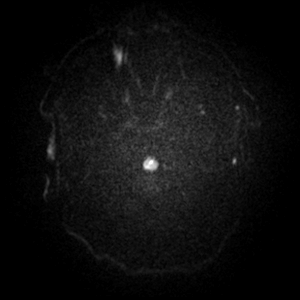
[im 16/48]
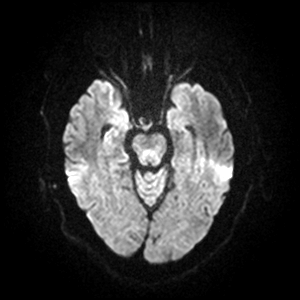
[im 32/48]
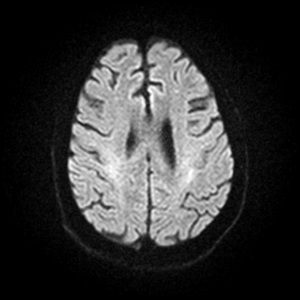
[im 48/48]
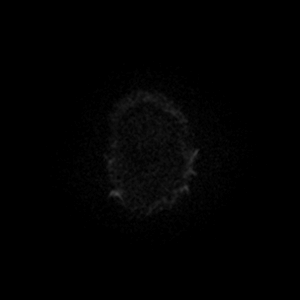

[Series 5: DWI · axial · 3.0mm · 0.77mm/px · z∈[-67,+74]mm · 4 of 48 slices shown (2 of 6)]
[im 1/48]
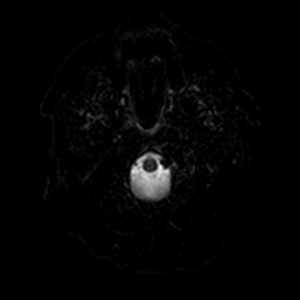
[im 16/48]
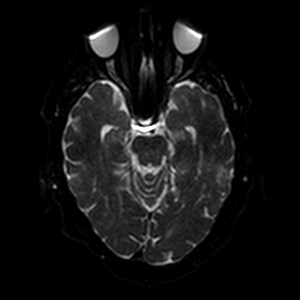
[im 32/48]
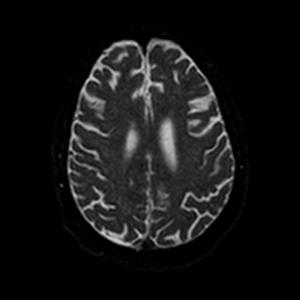
[im 48/48]
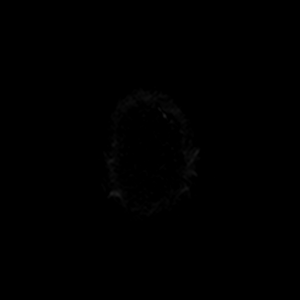

[Series 6: DWI · axial · 3.0mm · 0.77mm/px · z∈[-67,+74]mm · 4 of 48 slices shown (3 of 6)]
[im 1/48]
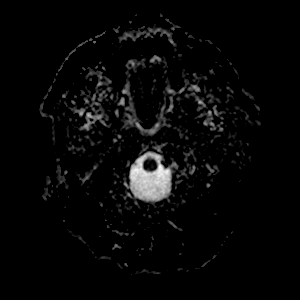
[im 16/48]
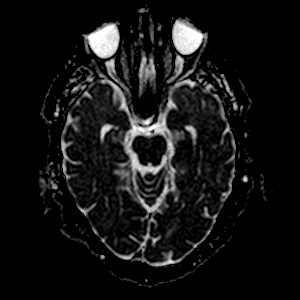
[im 32/48]
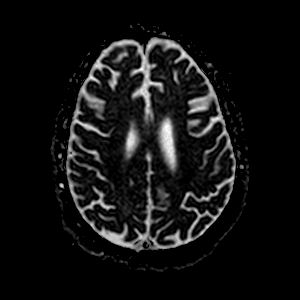
[im 48/48]
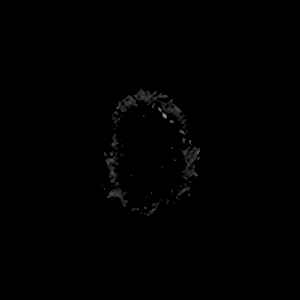

[Series 7: DWI · coronal · 5.0mm · 0.88mm/px · 2 of 28 slices shown (4 of 6)]
[im 1/28]
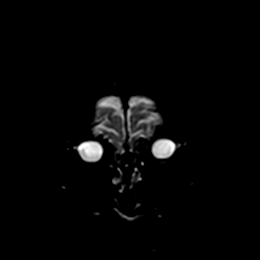
[im 28/28]
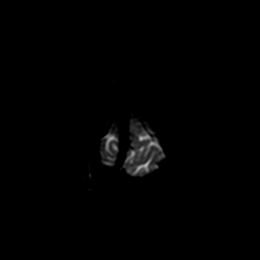

[Series 7: DWI · coronal · 5.0mm · 0.88mm/px · 2 of 28 slices shown (5 of 6)]
[im 1/28]
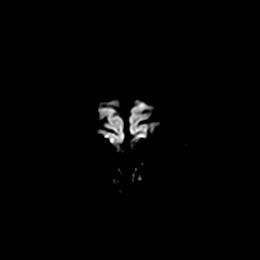
[im 28/28]
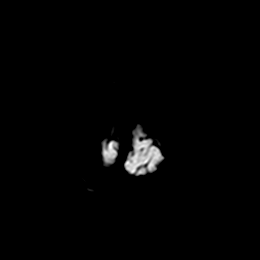

[Series 8: DWI · coronal · 5.0mm · 0.88mm/px · 2 of 28 slices shown (6 of 6)]
[im 1/28]
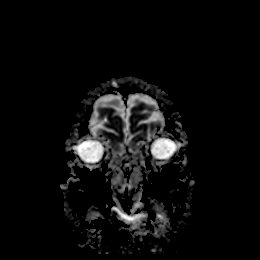
[im 28/28]
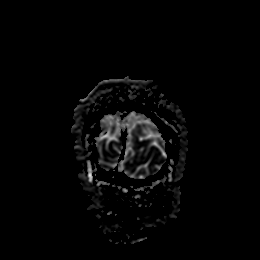

[Series 9: T1 · sagittal · 5.0mm · 0.75mm/px · 1 of 19 slices shown]
[im 1/19]
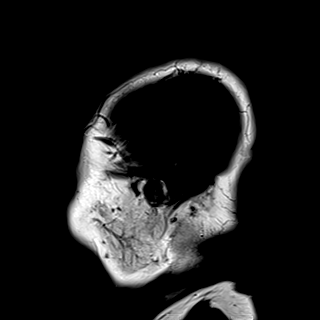

[Series 10: T2 · axial · 5.0mm · 0.72mm/px · 1 of 20 slices shown (1 of 2)]
[im 1/20]
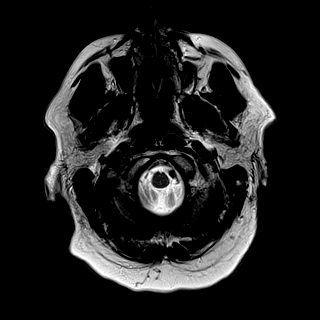

[Series 11: mag_images · axial · 3.0mm · 0.90mm/px · z∈[-79,+96]mm · 4 of 60 slices shown]
[im 1/60]
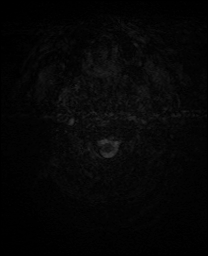
[im 20/60]
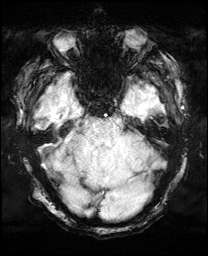
[im 40/60]
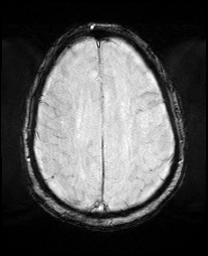
[im 60/60]
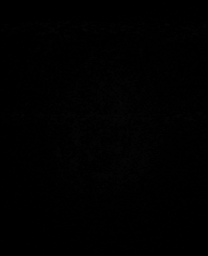

[Series 12: pha_images · axial · 3.0mm · 0.90mm/px · z∈[-76,+93]mm · 4 of 57 slices shown]
[im 1/57]
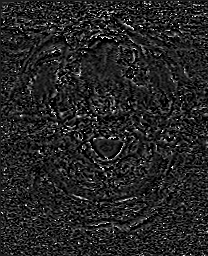
[im 19/57]
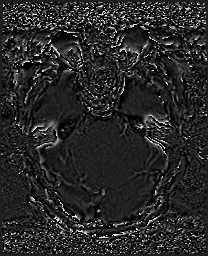
[im 38/57]
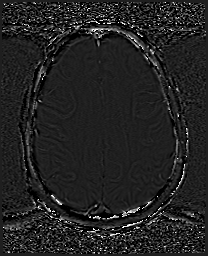
[im 57/57]
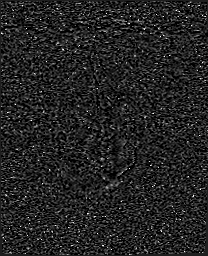

[Series 13: swi_images · axial · 3.0mm · 0.90mm/px · z∈[-79,+96]mm · 4 of 60 slices shown]
[im 1/60]
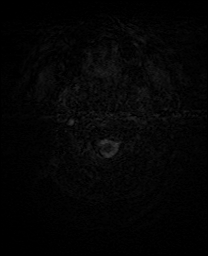
[im 20/60]
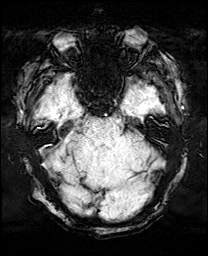
[im 40/60]
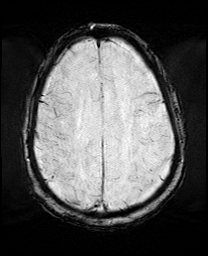
[im 60/60]
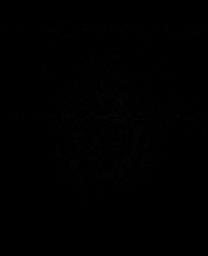

[Series 15: FLAIR · axial · 3.0mm · 0.45mm/px · z∈[-48,+68]mm · 3 of 40 slices shown]
[im 1/40]
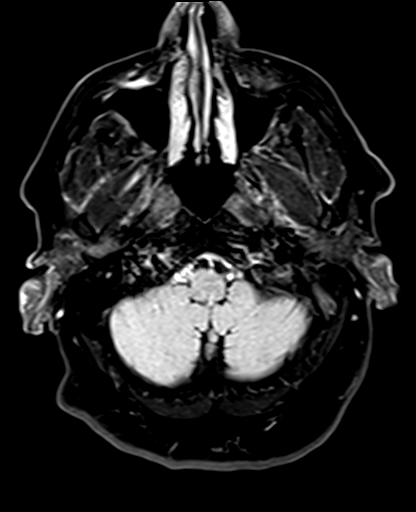
[im 20/40]
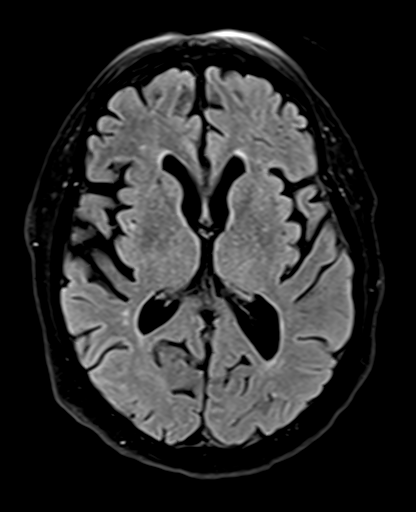
[im 40/40]
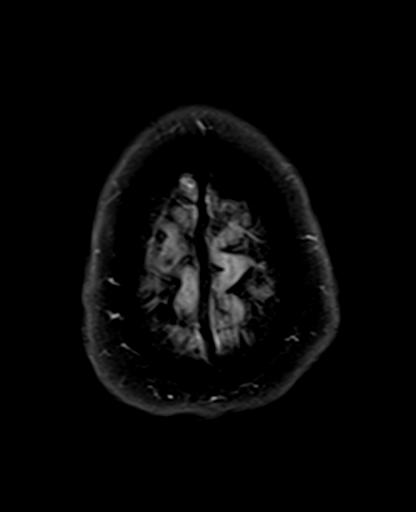

[Series 17: T2 · coronal · 5.0mm · 0.72mm/px · 2 of 28 slices shown (2 of 2)]
[im 1/28]
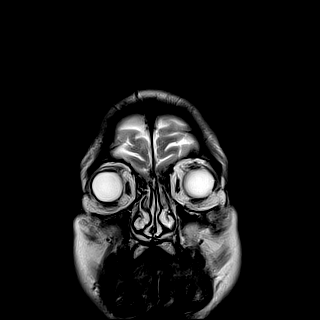
[im 28/28]
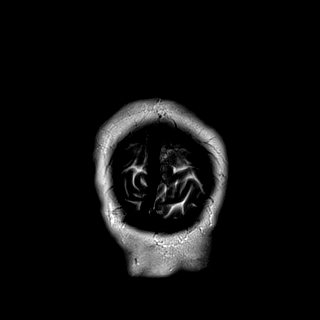

[37 of 48 positions shown; findings below may reference images not displayed]

FINDINGS: Brain: There is no evidence of acute intracranial hemorrhage,
extra-axial fluid collection, or infarct. There is mild parenchymal
volume loss with commensurate enlargement of the ventricular system.
There are scattered foci of FLAIR signal abnormality in the
subcortical and periventricular white matter, nonspecific but likely
reflecting sequela of mild chronic white matter microangiopathy. No
mass lesion is identified. There is no midline shift.

Vascular: Normal flow voids.

Skull and upper cervical spine: Normal marrow signal.

Sinuses/Orbits: The paranasal sinuses are clear. The mastoid air
cells are clear. Bilateral lens implants are noted. The orbits are
otherwise unremarkable.

Other: None.
IMPRESSION: No acute intracranial pathology.

Mild parenchymal volume loss and chronic white matter
microangiopathy.

## 2023-01-24 DIAGNOSIS — Z741 Need for assistance with personal care: Secondary | ICD-10-CM | POA: Diagnosis not present

## 2023-01-24 DIAGNOSIS — Z602 Problems related to living alone: Secondary | ICD-10-CM | POA: Diagnosis not present

## 2023-01-24 DIAGNOSIS — E119 Type 2 diabetes mellitus without complications: Secondary | ICD-10-CM | POA: Diagnosis not present

## 2023-01-24 DIAGNOSIS — Z7901 Long term (current) use of anticoagulants: Secondary | ICD-10-CM | POA: Diagnosis not present

## 2023-01-24 DIAGNOSIS — E785 Hyperlipidemia, unspecified: Secondary | ICD-10-CM | POA: Diagnosis not present

## 2023-01-24 DIAGNOSIS — I10 Essential (primary) hypertension: Secondary | ICD-10-CM | POA: Diagnosis not present

## 2023-01-24 DIAGNOSIS — I2510 Atherosclerotic heart disease of native coronary artery without angina pectoris: Secondary | ICD-10-CM | POA: Diagnosis not present

## 2023-01-24 DIAGNOSIS — F32A Depression, unspecified: Secondary | ICD-10-CM | POA: Diagnosis not present

## 2023-01-24 DIAGNOSIS — Z7984 Long term (current) use of oral hypoglycemic drugs: Secondary | ICD-10-CM | POA: Diagnosis not present

## 2023-01-24 DIAGNOSIS — Z96662 Presence of left artificial ankle joint: Secondary | ICD-10-CM | POA: Diagnosis not present

## 2023-01-24 DIAGNOSIS — Z7982 Long term (current) use of aspirin: Secondary | ICD-10-CM | POA: Diagnosis not present

## 2023-01-24 DIAGNOSIS — Z471 Aftercare following joint replacement surgery: Secondary | ICD-10-CM | POA: Diagnosis not present

## 2023-01-24 DIAGNOSIS — Z9181 History of falling: Secondary | ICD-10-CM | POA: Diagnosis not present

## 2023-01-24 DIAGNOSIS — K219 Gastro-esophageal reflux disease without esophagitis: Secondary | ICD-10-CM | POA: Diagnosis not present

## 2023-01-26 DIAGNOSIS — I2510 Atherosclerotic heart disease of native coronary artery without angina pectoris: Secondary | ICD-10-CM | POA: Diagnosis not present

## 2023-01-26 DIAGNOSIS — Z7984 Long term (current) use of oral hypoglycemic drugs: Secondary | ICD-10-CM | POA: Diagnosis not present

## 2023-01-26 DIAGNOSIS — Z9181 History of falling: Secondary | ICD-10-CM | POA: Diagnosis not present

## 2023-01-26 DIAGNOSIS — E119 Type 2 diabetes mellitus without complications: Secondary | ICD-10-CM | POA: Diagnosis not present

## 2023-01-26 DIAGNOSIS — Z96662 Presence of left artificial ankle joint: Secondary | ICD-10-CM | POA: Diagnosis not present

## 2023-01-26 DIAGNOSIS — Z741 Need for assistance with personal care: Secondary | ICD-10-CM | POA: Diagnosis not present

## 2023-01-26 DIAGNOSIS — K219 Gastro-esophageal reflux disease without esophagitis: Secondary | ICD-10-CM | POA: Diagnosis not present

## 2023-01-26 DIAGNOSIS — Z7901 Long term (current) use of anticoagulants: Secondary | ICD-10-CM | POA: Diagnosis not present

## 2023-01-26 DIAGNOSIS — F32A Depression, unspecified: Secondary | ICD-10-CM | POA: Diagnosis not present

## 2023-01-26 DIAGNOSIS — E785 Hyperlipidemia, unspecified: Secondary | ICD-10-CM | POA: Diagnosis not present

## 2023-01-26 DIAGNOSIS — Z7982 Long term (current) use of aspirin: Secondary | ICD-10-CM | POA: Diagnosis not present

## 2023-01-26 DIAGNOSIS — Z471 Aftercare following joint replacement surgery: Secondary | ICD-10-CM | POA: Diagnosis not present

## 2023-01-26 DIAGNOSIS — Z602 Problems related to living alone: Secondary | ICD-10-CM | POA: Diagnosis not present

## 2023-01-26 DIAGNOSIS — I10 Essential (primary) hypertension: Secondary | ICD-10-CM | POA: Diagnosis not present

## 2023-01-29 DIAGNOSIS — S82832D Other fracture of upper and lower end of left fibula, subsequent encounter for closed fracture with routine healing: Secondary | ICD-10-CM | POA: Diagnosis not present

## 2023-01-29 DIAGNOSIS — S82302D Unspecified fracture of lower end of left tibia, subsequent encounter for closed fracture with routine healing: Secondary | ICD-10-CM | POA: Diagnosis not present

## 2023-01-29 DIAGNOSIS — X58XXXD Exposure to other specified factors, subsequent encounter: Secondary | ICD-10-CM | POA: Diagnosis not present

## 2023-01-30 DIAGNOSIS — Z602 Problems related to living alone: Secondary | ICD-10-CM | POA: Diagnosis not present

## 2023-01-30 DIAGNOSIS — E119 Type 2 diabetes mellitus without complications: Secondary | ICD-10-CM | POA: Diagnosis not present

## 2023-01-30 DIAGNOSIS — Z7984 Long term (current) use of oral hypoglycemic drugs: Secondary | ICD-10-CM | POA: Diagnosis not present

## 2023-01-30 DIAGNOSIS — I2510 Atherosclerotic heart disease of native coronary artery without angina pectoris: Secondary | ICD-10-CM | POA: Diagnosis not present

## 2023-01-30 DIAGNOSIS — K219 Gastro-esophageal reflux disease without esophagitis: Secondary | ICD-10-CM | POA: Diagnosis not present

## 2023-01-30 DIAGNOSIS — Z471 Aftercare following joint replacement surgery: Secondary | ICD-10-CM | POA: Diagnosis not present

## 2023-01-30 DIAGNOSIS — E785 Hyperlipidemia, unspecified: Secondary | ICD-10-CM | POA: Diagnosis not present

## 2023-01-30 DIAGNOSIS — Z7901 Long term (current) use of anticoagulants: Secondary | ICD-10-CM | POA: Diagnosis not present

## 2023-01-30 DIAGNOSIS — F32A Depression, unspecified: Secondary | ICD-10-CM | POA: Diagnosis not present

## 2023-01-30 DIAGNOSIS — Z741 Need for assistance with personal care: Secondary | ICD-10-CM | POA: Diagnosis not present

## 2023-01-30 DIAGNOSIS — Z7982 Long term (current) use of aspirin: Secondary | ICD-10-CM | POA: Diagnosis not present

## 2023-01-30 DIAGNOSIS — I10 Essential (primary) hypertension: Secondary | ICD-10-CM | POA: Diagnosis not present

## 2023-01-30 DIAGNOSIS — Z96662 Presence of left artificial ankle joint: Secondary | ICD-10-CM | POA: Diagnosis not present

## 2023-01-30 DIAGNOSIS — Z9181 History of falling: Secondary | ICD-10-CM | POA: Diagnosis not present

## 2023-02-04 DIAGNOSIS — Z741 Need for assistance with personal care: Secondary | ICD-10-CM | POA: Diagnosis not present

## 2023-02-04 DIAGNOSIS — Z602 Problems related to living alone: Secondary | ICD-10-CM | POA: Diagnosis not present

## 2023-02-04 DIAGNOSIS — Z7901 Long term (current) use of anticoagulants: Secondary | ICD-10-CM | POA: Diagnosis not present

## 2023-02-04 DIAGNOSIS — K219 Gastro-esophageal reflux disease without esophagitis: Secondary | ICD-10-CM | POA: Diagnosis not present

## 2023-02-04 DIAGNOSIS — E785 Hyperlipidemia, unspecified: Secondary | ICD-10-CM | POA: Diagnosis not present

## 2023-02-04 DIAGNOSIS — I10 Essential (primary) hypertension: Secondary | ICD-10-CM | POA: Diagnosis not present

## 2023-02-04 DIAGNOSIS — F32A Depression, unspecified: Secondary | ICD-10-CM | POA: Diagnosis not present

## 2023-02-04 DIAGNOSIS — Z9181 History of falling: Secondary | ICD-10-CM | POA: Diagnosis not present

## 2023-02-04 DIAGNOSIS — Z7984 Long term (current) use of oral hypoglycemic drugs: Secondary | ICD-10-CM | POA: Diagnosis not present

## 2023-02-04 DIAGNOSIS — I2510 Atherosclerotic heart disease of native coronary artery without angina pectoris: Secondary | ICD-10-CM | POA: Diagnosis not present

## 2023-02-04 DIAGNOSIS — Z7982 Long term (current) use of aspirin: Secondary | ICD-10-CM | POA: Diagnosis not present

## 2023-02-04 DIAGNOSIS — Z471 Aftercare following joint replacement surgery: Secondary | ICD-10-CM | POA: Diagnosis not present

## 2023-02-04 DIAGNOSIS — E119 Type 2 diabetes mellitus without complications: Secondary | ICD-10-CM | POA: Diagnosis not present

## 2023-02-04 DIAGNOSIS — Z96662 Presence of left artificial ankle joint: Secondary | ICD-10-CM | POA: Diagnosis not present

## 2023-02-06 DIAGNOSIS — Z471 Aftercare following joint replacement surgery: Secondary | ICD-10-CM | POA: Diagnosis not present

## 2023-02-06 DIAGNOSIS — Z602 Problems related to living alone: Secondary | ICD-10-CM | POA: Diagnosis not present

## 2023-02-06 DIAGNOSIS — Z7901 Long term (current) use of anticoagulants: Secondary | ICD-10-CM | POA: Diagnosis not present

## 2023-02-06 DIAGNOSIS — Z96662 Presence of left artificial ankle joint: Secondary | ICD-10-CM | POA: Diagnosis not present

## 2023-02-06 DIAGNOSIS — Z7982 Long term (current) use of aspirin: Secondary | ICD-10-CM | POA: Diagnosis not present

## 2023-02-06 DIAGNOSIS — E785 Hyperlipidemia, unspecified: Secondary | ICD-10-CM | POA: Diagnosis not present

## 2023-02-06 DIAGNOSIS — I2510 Atherosclerotic heart disease of native coronary artery without angina pectoris: Secondary | ICD-10-CM | POA: Diagnosis not present

## 2023-02-06 DIAGNOSIS — Z741 Need for assistance with personal care: Secondary | ICD-10-CM | POA: Diagnosis not present

## 2023-02-06 DIAGNOSIS — F32A Depression, unspecified: Secondary | ICD-10-CM | POA: Diagnosis not present

## 2023-02-06 DIAGNOSIS — Z7984 Long term (current) use of oral hypoglycemic drugs: Secondary | ICD-10-CM | POA: Diagnosis not present

## 2023-02-06 DIAGNOSIS — E119 Type 2 diabetes mellitus without complications: Secondary | ICD-10-CM | POA: Diagnosis not present

## 2023-02-06 DIAGNOSIS — I10 Essential (primary) hypertension: Secondary | ICD-10-CM | POA: Diagnosis not present

## 2023-02-06 DIAGNOSIS — K219 Gastro-esophageal reflux disease without esophagitis: Secondary | ICD-10-CM | POA: Diagnosis not present

## 2023-02-06 DIAGNOSIS — Z9181 History of falling: Secondary | ICD-10-CM | POA: Diagnosis not present

## 2023-02-12 DIAGNOSIS — Z9181 History of falling: Secondary | ICD-10-CM | POA: Diagnosis not present

## 2023-02-12 DIAGNOSIS — Z602 Problems related to living alone: Secondary | ICD-10-CM | POA: Diagnosis not present

## 2023-02-12 DIAGNOSIS — E785 Hyperlipidemia, unspecified: Secondary | ICD-10-CM | POA: Diagnosis not present

## 2023-02-12 DIAGNOSIS — Z741 Need for assistance with personal care: Secondary | ICD-10-CM | POA: Diagnosis not present

## 2023-02-12 DIAGNOSIS — Z7901 Long term (current) use of anticoagulants: Secondary | ICD-10-CM | POA: Diagnosis not present

## 2023-02-12 DIAGNOSIS — Z96662 Presence of left artificial ankle joint: Secondary | ICD-10-CM | POA: Diagnosis not present

## 2023-02-12 DIAGNOSIS — F32A Depression, unspecified: Secondary | ICD-10-CM | POA: Diagnosis not present

## 2023-02-12 DIAGNOSIS — Z7984 Long term (current) use of oral hypoglycemic drugs: Secondary | ICD-10-CM | POA: Diagnosis not present

## 2023-02-12 DIAGNOSIS — I2510 Atherosclerotic heart disease of native coronary artery without angina pectoris: Secondary | ICD-10-CM | POA: Diagnosis not present

## 2023-02-12 DIAGNOSIS — K219 Gastro-esophageal reflux disease without esophagitis: Secondary | ICD-10-CM | POA: Diagnosis not present

## 2023-02-12 DIAGNOSIS — I10 Essential (primary) hypertension: Secondary | ICD-10-CM | POA: Diagnosis not present

## 2023-02-12 DIAGNOSIS — E119 Type 2 diabetes mellitus without complications: Secondary | ICD-10-CM | POA: Diagnosis not present

## 2023-02-12 DIAGNOSIS — Z471 Aftercare following joint replacement surgery: Secondary | ICD-10-CM | POA: Diagnosis not present

## 2023-02-12 DIAGNOSIS — Z7982 Long term (current) use of aspirin: Secondary | ICD-10-CM | POA: Diagnosis not present

## 2023-02-13 DIAGNOSIS — K219 Gastro-esophageal reflux disease without esophagitis: Secondary | ICD-10-CM | POA: Diagnosis not present

## 2023-02-13 DIAGNOSIS — Z471 Aftercare following joint replacement surgery: Secondary | ICD-10-CM | POA: Diagnosis not present

## 2023-02-13 DIAGNOSIS — Z9181 History of falling: Secondary | ICD-10-CM | POA: Diagnosis not present

## 2023-02-13 DIAGNOSIS — Z602 Problems related to living alone: Secondary | ICD-10-CM | POA: Diagnosis not present

## 2023-02-13 DIAGNOSIS — I2510 Atherosclerotic heart disease of native coronary artery without angina pectoris: Secondary | ICD-10-CM | POA: Diagnosis not present

## 2023-02-13 DIAGNOSIS — I10 Essential (primary) hypertension: Secondary | ICD-10-CM | POA: Diagnosis not present

## 2023-02-13 DIAGNOSIS — Z741 Need for assistance with personal care: Secondary | ICD-10-CM | POA: Diagnosis not present

## 2023-02-13 DIAGNOSIS — F32A Depression, unspecified: Secondary | ICD-10-CM | POA: Diagnosis not present

## 2023-02-13 DIAGNOSIS — Z7984 Long term (current) use of oral hypoglycemic drugs: Secondary | ICD-10-CM | POA: Diagnosis not present

## 2023-02-13 DIAGNOSIS — Z7982 Long term (current) use of aspirin: Secondary | ICD-10-CM | POA: Diagnosis not present

## 2023-02-13 DIAGNOSIS — E119 Type 2 diabetes mellitus without complications: Secondary | ICD-10-CM | POA: Diagnosis not present

## 2023-02-13 DIAGNOSIS — E785 Hyperlipidemia, unspecified: Secondary | ICD-10-CM | POA: Diagnosis not present

## 2023-02-13 DIAGNOSIS — Z96662 Presence of left artificial ankle joint: Secondary | ICD-10-CM | POA: Diagnosis not present

## 2023-02-13 DIAGNOSIS — Z7901 Long term (current) use of anticoagulants: Secondary | ICD-10-CM | POA: Diagnosis not present

## 2023-02-18 DIAGNOSIS — Z471 Aftercare following joint replacement surgery: Secondary | ICD-10-CM | POA: Diagnosis not present

## 2023-02-18 DIAGNOSIS — F32A Depression, unspecified: Secondary | ICD-10-CM | POA: Diagnosis not present

## 2023-02-18 DIAGNOSIS — K219 Gastro-esophageal reflux disease without esophagitis: Secondary | ICD-10-CM | POA: Diagnosis not present

## 2023-02-18 DIAGNOSIS — Z7982 Long term (current) use of aspirin: Secondary | ICD-10-CM | POA: Diagnosis not present

## 2023-02-18 DIAGNOSIS — Z741 Need for assistance with personal care: Secondary | ICD-10-CM | POA: Diagnosis not present

## 2023-02-18 DIAGNOSIS — Z9181 History of falling: Secondary | ICD-10-CM | POA: Diagnosis not present

## 2023-02-18 DIAGNOSIS — Z7901 Long term (current) use of anticoagulants: Secondary | ICD-10-CM | POA: Diagnosis not present

## 2023-02-18 DIAGNOSIS — I10 Essential (primary) hypertension: Secondary | ICD-10-CM | POA: Diagnosis not present

## 2023-02-18 DIAGNOSIS — E785 Hyperlipidemia, unspecified: Secondary | ICD-10-CM | POA: Diagnosis not present

## 2023-02-18 DIAGNOSIS — Z96662 Presence of left artificial ankle joint: Secondary | ICD-10-CM | POA: Diagnosis not present

## 2023-02-18 DIAGNOSIS — Z602 Problems related to living alone: Secondary | ICD-10-CM | POA: Diagnosis not present

## 2023-02-18 DIAGNOSIS — I2510 Atherosclerotic heart disease of native coronary artery without angina pectoris: Secondary | ICD-10-CM | POA: Diagnosis not present

## 2023-02-18 DIAGNOSIS — Z7984 Long term (current) use of oral hypoglycemic drugs: Secondary | ICD-10-CM | POA: Diagnosis not present

## 2023-02-18 DIAGNOSIS — E119 Type 2 diabetes mellitus without complications: Secondary | ICD-10-CM | POA: Diagnosis not present

## 2023-02-26 DIAGNOSIS — M25572 Pain in left ankle and joints of left foot: Secondary | ICD-10-CM | POA: Diagnosis not present

## 2023-02-26 DIAGNOSIS — R262 Difficulty in walking, not elsewhere classified: Secondary | ICD-10-CM | POA: Diagnosis not present

## 2023-02-26 DIAGNOSIS — Z96662 Presence of left artificial ankle joint: Secondary | ICD-10-CM | POA: Diagnosis not present

## 2023-02-26 DIAGNOSIS — Z4789 Encounter for other orthopedic aftercare: Secondary | ICD-10-CM | POA: Diagnosis not present

## 2023-03-02 DIAGNOSIS — Z96662 Presence of left artificial ankle joint: Secondary | ICD-10-CM | POA: Diagnosis not present

## 2023-03-02 DIAGNOSIS — M25572 Pain in left ankle and joints of left foot: Secondary | ICD-10-CM | POA: Diagnosis not present

## 2023-03-02 DIAGNOSIS — R262 Difficulty in walking, not elsewhere classified: Secondary | ICD-10-CM | POA: Diagnosis not present

## 2023-03-02 DIAGNOSIS — Z4789 Encounter for other orthopedic aftercare: Secondary | ICD-10-CM | POA: Diagnosis not present

## 2023-03-09 DIAGNOSIS — Z4789 Encounter for other orthopedic aftercare: Secondary | ICD-10-CM | POA: Diagnosis not present

## 2023-03-09 DIAGNOSIS — R262 Difficulty in walking, not elsewhere classified: Secondary | ICD-10-CM | POA: Diagnosis not present

## 2023-03-09 DIAGNOSIS — M25572 Pain in left ankle and joints of left foot: Secondary | ICD-10-CM | POA: Diagnosis not present

## 2023-03-09 DIAGNOSIS — Z96662 Presence of left artificial ankle joint: Secondary | ICD-10-CM | POA: Diagnosis not present

## 2023-03-12 DIAGNOSIS — M19072 Primary osteoarthritis, left ankle and foot: Secondary | ICD-10-CM | POA: Diagnosis not present

## 2023-03-12 DIAGNOSIS — M7732 Calcaneal spur, left foot: Secondary | ICD-10-CM | POA: Diagnosis not present

## 2023-03-16 DIAGNOSIS — Z96662 Presence of left artificial ankle joint: Secondary | ICD-10-CM | POA: Diagnosis not present

## 2023-03-16 DIAGNOSIS — R262 Difficulty in walking, not elsewhere classified: Secondary | ICD-10-CM | POA: Diagnosis not present

## 2023-03-16 DIAGNOSIS — M25572 Pain in left ankle and joints of left foot: Secondary | ICD-10-CM | POA: Diagnosis not present

## 2023-03-16 DIAGNOSIS — Z4789 Encounter for other orthopedic aftercare: Secondary | ICD-10-CM | POA: Diagnosis not present

## 2023-03-19 DIAGNOSIS — Z96662 Presence of left artificial ankle joint: Secondary | ICD-10-CM | POA: Diagnosis not present

## 2023-03-19 DIAGNOSIS — R262 Difficulty in walking, not elsewhere classified: Secondary | ICD-10-CM | POA: Diagnosis not present

## 2023-03-19 DIAGNOSIS — Z4789 Encounter for other orthopedic aftercare: Secondary | ICD-10-CM | POA: Diagnosis not present

## 2023-03-19 DIAGNOSIS — M25572 Pain in left ankle and joints of left foot: Secondary | ICD-10-CM | POA: Diagnosis not present

## 2023-03-24 DIAGNOSIS — M25572 Pain in left ankle and joints of left foot: Secondary | ICD-10-CM | POA: Diagnosis not present

## 2023-03-24 DIAGNOSIS — R262 Difficulty in walking, not elsewhere classified: Secondary | ICD-10-CM | POA: Diagnosis not present

## 2023-03-24 DIAGNOSIS — Z96662 Presence of left artificial ankle joint: Secondary | ICD-10-CM | POA: Diagnosis not present

## 2023-03-24 DIAGNOSIS — Z4789 Encounter for other orthopedic aftercare: Secondary | ICD-10-CM | POA: Diagnosis not present

## 2023-03-30 DIAGNOSIS — Z96662 Presence of left artificial ankle joint: Secondary | ICD-10-CM | POA: Diagnosis not present

## 2023-03-30 DIAGNOSIS — M25572 Pain in left ankle and joints of left foot: Secondary | ICD-10-CM | POA: Diagnosis not present

## 2023-03-30 DIAGNOSIS — Z4789 Encounter for other orthopedic aftercare: Secondary | ICD-10-CM | POA: Diagnosis not present

## 2023-03-30 DIAGNOSIS — R262 Difficulty in walking, not elsewhere classified: Secondary | ICD-10-CM | POA: Diagnosis not present

## 2023-04-01 DIAGNOSIS — H6123 Impacted cerumen, bilateral: Secondary | ICD-10-CM | POA: Diagnosis not present

## 2023-04-02 DIAGNOSIS — Z96662 Presence of left artificial ankle joint: Secondary | ICD-10-CM | POA: Diagnosis not present

## 2023-04-02 DIAGNOSIS — R262 Difficulty in walking, not elsewhere classified: Secondary | ICD-10-CM | POA: Diagnosis not present

## 2023-04-02 DIAGNOSIS — M25572 Pain in left ankle and joints of left foot: Secondary | ICD-10-CM | POA: Diagnosis not present

## 2023-04-02 DIAGNOSIS — Z4789 Encounter for other orthopedic aftercare: Secondary | ICD-10-CM | POA: Diagnosis not present

## 2023-04-20 DIAGNOSIS — R262 Difficulty in walking, not elsewhere classified: Secondary | ICD-10-CM | POA: Diagnosis not present

## 2023-04-20 DIAGNOSIS — Z4789 Encounter for other orthopedic aftercare: Secondary | ICD-10-CM | POA: Diagnosis not present

## 2023-04-20 DIAGNOSIS — Z96662 Presence of left artificial ankle joint: Secondary | ICD-10-CM | POA: Diagnosis not present

## 2023-04-20 DIAGNOSIS — M25572 Pain in left ankle and joints of left foot: Secondary | ICD-10-CM | POA: Diagnosis not present

## 2023-04-22 DIAGNOSIS — R262 Difficulty in walking, not elsewhere classified: Secondary | ICD-10-CM | POA: Diagnosis not present

## 2023-04-22 DIAGNOSIS — Z4789 Encounter for other orthopedic aftercare: Secondary | ICD-10-CM | POA: Diagnosis not present

## 2023-04-22 DIAGNOSIS — M25572 Pain in left ankle and joints of left foot: Secondary | ICD-10-CM | POA: Diagnosis not present

## 2023-04-22 DIAGNOSIS — Z96662 Presence of left artificial ankle joint: Secondary | ICD-10-CM | POA: Diagnosis not present

## 2023-04-27 DIAGNOSIS — R262 Difficulty in walking, not elsewhere classified: Secondary | ICD-10-CM | POA: Diagnosis not present

## 2023-04-27 DIAGNOSIS — M25572 Pain in left ankle and joints of left foot: Secondary | ICD-10-CM | POA: Diagnosis not present

## 2023-04-27 DIAGNOSIS — Z96662 Presence of left artificial ankle joint: Secondary | ICD-10-CM | POA: Diagnosis not present

## 2023-04-27 DIAGNOSIS — Z4789 Encounter for other orthopedic aftercare: Secondary | ICD-10-CM | POA: Diagnosis not present

## 2023-04-30 DIAGNOSIS — E1169 Type 2 diabetes mellitus with other specified complication: Secondary | ICD-10-CM | POA: Diagnosis not present

## 2023-04-30 DIAGNOSIS — E039 Hypothyroidism, unspecified: Secondary | ICD-10-CM | POA: Diagnosis not present

## 2023-04-30 DIAGNOSIS — E782 Mixed hyperlipidemia: Secondary | ICD-10-CM | POA: Diagnosis not present

## 2023-05-04 DIAGNOSIS — R262 Difficulty in walking, not elsewhere classified: Secondary | ICD-10-CM | POA: Diagnosis not present

## 2023-05-04 DIAGNOSIS — Z96662 Presence of left artificial ankle joint: Secondary | ICD-10-CM | POA: Diagnosis not present

## 2023-05-04 DIAGNOSIS — Z4789 Encounter for other orthopedic aftercare: Secondary | ICD-10-CM | POA: Diagnosis not present

## 2023-05-04 DIAGNOSIS — M25572 Pain in left ankle and joints of left foot: Secondary | ICD-10-CM | POA: Diagnosis not present

## 2023-05-06 DIAGNOSIS — E039 Hypothyroidism, unspecified: Secondary | ICD-10-CM | POA: Diagnosis not present

## 2023-05-06 DIAGNOSIS — D649 Anemia, unspecified: Secondary | ICD-10-CM | POA: Diagnosis not present

## 2023-05-06 DIAGNOSIS — E1169 Type 2 diabetes mellitus with other specified complication: Secondary | ICD-10-CM | POA: Diagnosis not present

## 2023-05-06 DIAGNOSIS — I10 Essential (primary) hypertension: Secondary | ICD-10-CM | POA: Diagnosis not present

## 2023-05-06 DIAGNOSIS — M25551 Pain in right hip: Secondary | ICD-10-CM | POA: Diagnosis not present

## 2023-05-06 DIAGNOSIS — I700 Atherosclerosis of aorta: Secondary | ICD-10-CM | POA: Diagnosis not present

## 2023-05-06 DIAGNOSIS — E559 Vitamin D deficiency, unspecified: Secondary | ICD-10-CM | POA: Diagnosis not present

## 2023-05-06 DIAGNOSIS — D446 Neoplasm of uncertain behavior of carotid body: Secondary | ICD-10-CM | POA: Diagnosis not present

## 2023-05-06 DIAGNOSIS — K219 Gastro-esophageal reflux disease without esophagitis: Secondary | ICD-10-CM | POA: Diagnosis not present

## 2023-05-06 DIAGNOSIS — E782 Mixed hyperlipidemia: Secondary | ICD-10-CM | POA: Diagnosis not present

## 2023-05-18 DIAGNOSIS — Z4789 Encounter for other orthopedic aftercare: Secondary | ICD-10-CM | POA: Diagnosis not present

## 2023-05-18 DIAGNOSIS — Z96662 Presence of left artificial ankle joint: Secondary | ICD-10-CM | POA: Diagnosis not present

## 2023-05-18 DIAGNOSIS — R262 Difficulty in walking, not elsewhere classified: Secondary | ICD-10-CM | POA: Diagnosis not present

## 2023-05-18 DIAGNOSIS — M25572 Pain in left ankle and joints of left foot: Secondary | ICD-10-CM | POA: Diagnosis not present

## 2023-05-20 DIAGNOSIS — Z96662 Presence of left artificial ankle joint: Secondary | ICD-10-CM | POA: Diagnosis not present

## 2023-05-20 DIAGNOSIS — R262 Difficulty in walking, not elsewhere classified: Secondary | ICD-10-CM | POA: Diagnosis not present

## 2023-05-20 DIAGNOSIS — M25572 Pain in left ankle and joints of left foot: Secondary | ICD-10-CM | POA: Diagnosis not present

## 2023-05-20 DIAGNOSIS — Z4789 Encounter for other orthopedic aftercare: Secondary | ICD-10-CM | POA: Diagnosis not present

## 2023-05-25 DIAGNOSIS — Z4789 Encounter for other orthopedic aftercare: Secondary | ICD-10-CM | POA: Diagnosis not present

## 2023-05-25 DIAGNOSIS — Z96662 Presence of left artificial ankle joint: Secondary | ICD-10-CM | POA: Diagnosis not present

## 2023-05-25 DIAGNOSIS — R262 Difficulty in walking, not elsewhere classified: Secondary | ICD-10-CM | POA: Diagnosis not present

## 2023-05-25 DIAGNOSIS — M25572 Pain in left ankle and joints of left foot: Secondary | ICD-10-CM | POA: Diagnosis not present

## 2023-05-27 DIAGNOSIS — M25572 Pain in left ankle and joints of left foot: Secondary | ICD-10-CM | POA: Diagnosis not present

## 2023-05-27 DIAGNOSIS — R262 Difficulty in walking, not elsewhere classified: Secondary | ICD-10-CM | POA: Diagnosis not present

## 2023-05-27 DIAGNOSIS — Z96662 Presence of left artificial ankle joint: Secondary | ICD-10-CM | POA: Diagnosis not present

## 2023-05-27 DIAGNOSIS — Z4789 Encounter for other orthopedic aftercare: Secondary | ICD-10-CM | POA: Diagnosis not present

## 2023-06-01 DIAGNOSIS — Z4789 Encounter for other orthopedic aftercare: Secondary | ICD-10-CM | POA: Diagnosis not present

## 2023-06-01 DIAGNOSIS — Z96662 Presence of left artificial ankle joint: Secondary | ICD-10-CM | POA: Diagnosis not present

## 2023-06-01 DIAGNOSIS — M25572 Pain in left ankle and joints of left foot: Secondary | ICD-10-CM | POA: Diagnosis not present

## 2023-06-01 DIAGNOSIS — R262 Difficulty in walking, not elsewhere classified: Secondary | ICD-10-CM | POA: Diagnosis not present

## 2023-06-23 DIAGNOSIS — Z96662 Presence of left artificial ankle joint: Secondary | ICD-10-CM | POA: Diagnosis not present

## 2023-06-23 DIAGNOSIS — E118 Type 2 diabetes mellitus with unspecified complications: Secondary | ICD-10-CM | POA: Diagnosis not present

## 2023-06-23 DIAGNOSIS — I700 Atherosclerosis of aorta: Secondary | ICD-10-CM | POA: Diagnosis not present

## 2023-06-23 DIAGNOSIS — M19172 Post-traumatic osteoarthritis, left ankle and foot: Secondary | ICD-10-CM | POA: Diagnosis not present

## 2023-07-24 ENCOUNTER — Ambulatory Visit: Payer: Medicare Other | Admitting: Internal Medicine

## 2023-07-24 ENCOUNTER — Encounter: Payer: Self-pay | Admitting: Internal Medicine

## 2023-07-24 VITALS — BP 138/77 | HR 67 | Temp 97.5°F | Ht 64.0 in | Wt 194.2 lb

## 2023-07-24 DIAGNOSIS — R194 Change in bowel habit: Secondary | ICD-10-CM

## 2023-07-24 DIAGNOSIS — K2270 Barrett's esophagus without dysplasia: Secondary | ICD-10-CM

## 2023-07-24 DIAGNOSIS — R197 Diarrhea, unspecified: Secondary | ICD-10-CM

## 2023-07-24 NOTE — Patient Instructions (Signed)
 It was good to see you again today!  You are reporting diarrhea about every 3 to 4 days.  You are not impacted.  This may be related to medication (metformin).  Begin fiber supplement with Benefiber 1 tablespoon daily for 3 weeks; then increase to 2 tablespoons daily thereafter  Will plan to see you back in 3 months.  If bowel function has not returned to your baseline may need to consider stopping metformin.  At this time, no need for colonoscopy or other diagnostic studies.  Should be on omeprazole 20 mg daily.  I do not see that on your medication list.  This is for your Barrett's esophagus.  Plan to look into your esophagus 1 more time in 2 years as long as your overall health permits.

## 2023-07-24 NOTE — Progress Notes (Signed)
 Primary Care Physician:  Shona Norleen PEDLAR, MD Primary Gastroenterologist:  Dr. Shaaron  Pre-Procedure History & Physical: HPI:  [name] is a 78 y.o. male here for follow-up short segment Barrett's esophagus.  Notes for the past 6 months he reports diarrhea.  States his stools are loose however only has a bowel movement about every 3 to 4 days.  No abdominal discomfort no bleeding no nausea or vomiting no dysphagia.  Reflux well-controlled.  He supposed to be on omeprazole 20 mg daily short segment Barrett's esophagus.  Past Medical History:  Diagnosis Date   Barrett esophagus    CAD (coronary artery disease)    Carotid body tumor (HCC)    Depression    Diabetes mellitus without complication (HCC)    Essential hypertension    GERD (gastroesophageal reflux disease)    Mixed hyperlipidemia    PTSD (post-traumatic stress disorder)     Past Surgical History:  Procedure Laterality Date   BIOPSY  09/18/2022   Procedure: BIOPSY;  Surgeon: Shaaron [name], MD;  [facility];  Service: Endoscopy;;   CARPAL TUNNEL RELEASE Bilateral    ESOPHAGOGASTRODUODENOSCOPY (EGD) WITH PROPOFOL  N/A 09/18/2022   Procedure: ESOPHAGOGASTRODUODENOSCOPY (EGD) WITH PROPOFOL ;  Surgeon: Shaaron [name], MD;  [facility]: [facility];  Service: Endoscopy;  Laterality: N/A;  2:15 pm, asa 3   MALONEY DILATION N/A 09/18/2022   Procedure: MALONEY DILATION;  Surgeon: Shaaron [name], MD;  [facility]: [facility];  Service: Endoscopy;  Laterality: N/A;   ORIF ANKLE FRACTURE Left    ORTHOPEDIC SURGERY     left knee arthroplasty    Prior to Admission medications   Medication Sig Start Date End Date Taking? Authorizing Provider  amLODipine  (NORVASC ) 5 MG tablet TAKE ONE TABLET BY MOUTH ONCE DAILY. 09/04/20  Yes Debera Jayson MATSU, MD  aspirin  EC 81 MG tablet Take 1 tablet (81 mg total) by mouth daily. 08/16/19  Yes Debera Jayson MATSU, MD  Cholecalciferol (VITAMIN D-1000 MAX ST) 25 MCG (1000 UT) tablet Take by  mouth. 11/08/21  Yes [provider]  lisinopril (ZESTRIL) 40 MG tablet Take 40 mg by mouth daily.   Yes [provider]  rosuvastatin  (CRESTOR ) 20 MG tablet Take 20 mg by mouth daily.   Yes [provider]    Allergies as of 07/24/2023 - Review Complete 07/24/2023  Allergen Reaction Noted   Simvastatin Rash 10/08/2010    Family History  Problem Relation Age of Onset   CVA Father        Cerebral hemorrhage    Social History   Socioeconomic History   Marital status: Married    Spouse name: Not on file   Number of children: Not on file   Years of education: Not on file   Highest education level: Not on file  Occupational History   Not on file  Tobacco Use   Smoking status: Former    Current packs/day: 0.00    Average packs/day: 1 pack/day for 2.0 years (2.0 ttl pk-yrs)    Types: Cigarettes    Start date: 39    Quit date: 32    Years since quitting: 50.0   Smokeless tobacco: Never  Vaping Use   Vaping status: Never Used  Substance and Sexual Activity   Alcohol use: Never   Drug use: Never   Sexual activity: Not Currently  Other Topics Concern   Not on file  Social History Narrative   Right handed   Lives  with wife in one story home   Social Drivers of Health   Financial Resource Strain: Low Risk  (12/18/2022)   Received [facility], [facility]   Overall Financial Resource Strain (CARDIA)    Difficulty of Paying Living Expenses: Not hard at all  Food Insecurity: No Food Insecurity (12/18/2022)   Received from [facility], [facility]   Hunger Vital Sign    Worried About Running Out of Food in the Last Year: Never true    Ran Out of Food in the Last Year: Never true  Transportation Needs: No Transportation Needs (12/18/2022)   Received from [facility], [facility]   [facility] - Transportation    In the past 12 months, has  lack of transportation kept you from medical appointments or from getting medications?: No    Lack of Transportation (Non-Medical): No  Physical Activity: Not on file  Stress: Not on file  Social Connections: Not on file  Intimate Partner Violence: Not At Risk (04/22/2022)   Humiliation, Afraid, Rape, and Kick questionnaire    Fear of Current or Ex-Partner: No    Emotionally Abused: No    Physically Abused: No    Sexually Abused: No    Review of Systems: See HPI, otherwise negative ROS  Physical Exam: BP 138/77 (BP Location: Left Arm, Patient Position: Sitting, Cuff Size: Large)   Pulse 67   Temp (!) 97.5 F (36.4 C) (Oral)   Ht 5' 4 (1.626 m)   Wt 194 lb 3.2 oz (88.1 kg)   SpO2 95%   BMI 33.33 kg/m  General:   Alert,  Well-developed, well-nourished, pleasant and cooperative in NAD Neck:  Supple; no masses or thyromegaly. No significant cervical adenopathy. Lungs:  Clear throughout to auscultation.   No wheezes, crackles, or rhonchi. No acute distress. Heart:  Regular rate and rhythm; no murmurs, clicks, rubs,  or gallops. Abdomen: Non-distended, normal bowel sounds.  Soft and nontender without appreciable mass or hepatosplenomegaly.   Rectal: Good sphincter tone.  No mass in the rectal vault.  No impaction.  Scant brown stool Hemoccult negative.  Impression/Plan: 79 year old gentleman reporting diarrhea which is described as loose bowel movement every 3 to 4 days in a background of a history of constipation.  This may be a medication issue (metformin).  I do not think he is having overflow diarrhea around an impaction as he does not have one today.  Recommendations:  Begin fiber supplement with Benefiber 1 tablespoon daily for 3 weeks; then increase to 2 tablespoons daily thereafter  Will plan to see back in 3 months.  If bowel function has not returned to baseline may need to consider stopping metformin.  At this time, no need for colonoscopy or other diagnostic  studies.  Should be on omeprazole 20 mg daily.    Plan to look into your esophagus 1 more time in 2 years as long as your overall health permits.      Notice: This dictation was prepared with Dragon dictation along with smaller phrase technology. Any transcriptional errors that result from this process are unintentional and may not be corrected upon review.

## 2023-07-27 ENCOUNTER — Telehealth: Payer: Self-pay

## 2023-07-27 NOTE — Addendum Note (Signed)
 Addended by: Zada Finders on: 07/27/2023 01:15 PM   Modules accepted: Orders

## 2023-07-27 NOTE — Telephone Encounter (Signed)
Pt's medication list has been updated.  

## 2023-08-12 DIAGNOSIS — E039 Hypothyroidism, unspecified: Secondary | ICD-10-CM | POA: Diagnosis not present

## 2023-08-12 DIAGNOSIS — E782 Mixed hyperlipidemia: Secondary | ICD-10-CM | POA: Diagnosis not present

## 2023-08-12 DIAGNOSIS — E1169 Type 2 diabetes mellitus with other specified complication: Secondary | ICD-10-CM | POA: Diagnosis not present

## 2023-08-17 ENCOUNTER — Other Ambulatory Visit: Payer: Self-pay

## 2023-08-17 ENCOUNTER — Emergency Department (HOSPITAL_COMMUNITY)
Admission: EM | Admit: 2023-08-17 | Discharge: 2023-08-17 | Disposition: A | Discharge status: Home / Self Care | Payer: No Typology Code available for payment source | Attending: Emergency Medicine | Admitting: Emergency Medicine

## 2023-08-17 ENCOUNTER — Encounter (HOSPITAL_COMMUNITY): Payer: Self-pay

## 2023-08-17 DIAGNOSIS — Z7982 Long term (current) use of aspirin: Secondary | ICD-10-CM | POA: Diagnosis not present

## 2023-08-17 DIAGNOSIS — L0231 Cutaneous abscess of buttock: Secondary | ICD-10-CM | POA: Diagnosis present

## 2023-08-17 DIAGNOSIS — Z79899 Other long term (current) drug therapy: Secondary | ICD-10-CM | POA: Insufficient documentation

## 2023-08-17 DIAGNOSIS — Z7984 Long term (current) use of oral hypoglycemic drugs: Secondary | ICD-10-CM | POA: Insufficient documentation

## 2023-08-17 DIAGNOSIS — E119 Type 2 diabetes mellitus without complications: Secondary | ICD-10-CM | POA: Insufficient documentation

## 2023-08-17 DIAGNOSIS — I2510 Atherosclerotic heart disease of native coronary artery without angina pectoris: Secondary | ICD-10-CM | POA: Insufficient documentation

## 2023-08-17 DIAGNOSIS — I10 Essential (primary) hypertension: Secondary | ICD-10-CM | POA: Diagnosis not present

## 2023-08-17 LAB — CBG MONITORING, ED: Glucose-Capillary: 98 mg/dL (ref 70–99)

## 2023-08-17 MED ORDER — DOXYCYCLINE HYCLATE 100 MG PO CAPS: 100.0000 mg | ORAL_CAPSULE | Freq: Two times a day (BID) | ORAL | 0 refills | Status: AC

## 2023-08-17 NOTE — Discharge Instructions (Signed)
A prescription for an antibiotic was sent to your pharmacy.  Take as prescribed for the next 5 days.  Return to the emergency department for any new or worsening symptoms of concern.

## 2023-08-17 NOTE — ED Triage Notes (Signed)
Pt arrived from home via POV a abscess to his right buttocks. Pt reports he first noticed the abscess on Saturday. Redness and swelling present in Triage.

## 2023-08-17 NOTE — ED Provider Notes (Signed)
[place] EMERGENCY DEPARTMENT AT [facility] Provider Note   CSN: [id number] Arrival date & time: 08/17/23  1146     History  Chief Complaint  Patient presents with   Abscess    [name] is a 78 y.o. male.   Abscess Patient presents for area of redness and swelling on buttocks.  This was first noticed 2 days ago.  Medical history includes CAD, HLD, HTN, DM, depression.  Patient has not noticed any drainage from the area.  He does not have any other skin findings of concern.  He denies any systemic symptoms.  He states that he did have a renal abscess on his chest but typically does not get abscesses.     Home Medications Prior to Admission medications   Medication Sig Start Date End Date Taking? Authorizing Provider  doxycycline (VIBRAMYCIN) 100 MG capsule Take 1 capsule (100 mg total) by mouth 2 (two) times daily for 5 days. 08/17/23 08/22/23 Yes Gloris Manchester, MD  amLODipine (NORVASC) 5 MG tablet TAKE ONE TABLET BY MOUTH ONCE DAILY. 09/04/20   Jonelle Sidle, MD  aspirin EC 81 MG tablet Take 1 tablet (81 mg total) by mouth daily. 08/16/19   Jonelle Sidle, MD  Cholecalciferol (VITAMIN D-1000 MAX ST) 25 MCG (1000 UT) tablet Take by mouth. 11/08/21   [provider]  lisinopril (ZESTRIL) 40 MG tablet Take 40 mg by mouth daily.    [provider]  metFORMIN (GLUCOPHAGE) 1000 MG tablet Take by mouth. 06/13/23   [provider]  rosuvastatin (CRESTOR) 20 MG tablet Take 1 tablet by mouth daily. 08/21/21   [provider]      Allergies    Simvastatin    Review of Systems   Review of Systems  Skin:  Positive for color change.  All other systems reviewed and are negative.   Physical Exam Updated Vital Signs BP (!) 159/78 (BP Location: Right Arm)   Pulse 81   Temp 98.5 F (36.9 C) (Oral)   Resp 16   Ht 5\' 4"  (1.626 m)   Wt 86.2 kg   SpO2 97%   BMI 32.61 kg/m  Physical Exam Vitals and nursing note reviewed.   Constitutional:      General: He is not in acute distress.    Appearance: Normal appearance. He is well-developed. He is not ill-appearing, toxic-appearing or diaphoretic.  HENT:     Head: Normocephalic and atraumatic.     Right Ear: External ear normal.     Left Ear: External ear normal.     Nose: Nose normal.     Mouth/Throat:     Mouth: Mucous membranes are moist.  Eyes:     Extraocular Movements: Extraocular movements intact.     Conjunctiva/sclera: Conjunctivae normal.  Cardiovascular:     Rate and Rhythm: Normal rate and regular rhythm.  Pulmonary:     Effort: Pulmonary effort is normal. No respiratory distress.  Abdominal:     General: There is no distension.     Palpations: Abdomen is soft.  Musculoskeletal:        General: Swelling and tenderness present.     Cervical back: Normal range of motion and neck supple.  Skin:    General: Skin is warm and dry.     Findings: Erythema present.  Neurological:     General: No focal deficit present.     Mental Status: He is alert and oriented to person, place, and time.  Psychiatric:  Mood and Affect: Mood normal.        Behavior: Behavior normal.     ED Results / Procedures / Treatments   Labs (all labs ordered are listed, but only abnormal results are displayed) Labs Reviewed  CBG MONITORING, ED    EKG None  Radiology No results found.  Procedures Procedures    Medications Ordered in ED Medications - No data to display  ED Course/ Medical Decision Making/ A&P                                 Medical Decision Making  Patient presents for 2 days of area of erythema, swelling, pain, and tenderness on area of right lateral buttock.  He denies any other associated symptoms.  On arrival in the ED, vital signs are notable for moderate hypertension.  Patient is overall well-appearing on exam.  CBG was normal.  Area of concern is small, erythematous, and tender.  On bedside ultrasound there was a small,  subcentimeter abscess just below surface with some heterogeneous material inside.  When squeezed, abscess had spontaneous drainage.  A small amount of purulent material was expressed.  On repeat ultrasound, there is no further fluid collection.  Patient was prescribed doxycycline.  He was advised to return if symptoms worsen.  He was discharged in good condition.        Final Clinical Impression(s) / ED Diagnoses Final diagnoses:  Abscess of buttock, right    Rx / DC Orders ED Discharge Orders          Ordered    doxycycline (VIBRAMYCIN) 100 MG capsule  2 times daily        08/17/23 1524              Gloris Manchester, MD 08/17/23 1525

## 2023-08-18 DIAGNOSIS — E559 Vitamin D deficiency, unspecified: Secondary | ICD-10-CM | POA: Diagnosis not present

## 2023-08-18 DIAGNOSIS — M25551 Pain in right hip: Secondary | ICD-10-CM | POA: Diagnosis not present

## 2023-08-18 DIAGNOSIS — E782 Mixed hyperlipidemia: Secondary | ICD-10-CM | POA: Diagnosis not present

## 2023-08-18 DIAGNOSIS — E1169 Type 2 diabetes mellitus with other specified complication: Secondary | ICD-10-CM | POA: Diagnosis not present

## 2023-08-18 DIAGNOSIS — K219 Gastro-esophageal reflux disease without esophagitis: Secondary | ICD-10-CM | POA: Diagnosis not present

## 2023-08-18 DIAGNOSIS — D649 Anemia, unspecified: Secondary | ICD-10-CM | POA: Diagnosis not present

## 2023-08-18 DIAGNOSIS — D446 Neoplasm of uncertain behavior of carotid body: Secondary | ICD-10-CM | POA: Diagnosis not present

## 2023-08-18 DIAGNOSIS — I10 Essential (primary) hypertension: Secondary | ICD-10-CM | POA: Diagnosis not present

## 2023-08-18 DIAGNOSIS — I700 Atherosclerosis of aorta: Secondary | ICD-10-CM | POA: Diagnosis not present

## 2023-08-18 DIAGNOSIS — E039 Hypothyroidism, unspecified: Secondary | ICD-10-CM | POA: Diagnosis not present

## 2023-10-07 ENCOUNTER — Encounter: Payer: Self-pay | Admitting: Internal Medicine

## 2023-10-07 DIAGNOSIS — H6121 Impacted cerumen, right ear: Secondary | ICD-10-CM | POA: Diagnosis not present

## 2023-11-10 ENCOUNTER — Encounter: Payer: Self-pay | Admitting: Internal Medicine

## 2023-11-10 ENCOUNTER — Ambulatory Visit (INDEPENDENT_AMBULATORY_CARE_PROVIDER_SITE_OTHER): Admitting: Internal Medicine

## 2023-11-10 VITALS — BP 126/72 | HR 49 | Temp 97.7°F | Ht 64.0 in | Wt 192.8 lb

## 2023-11-10 DIAGNOSIS — Z8719 Personal history of other diseases of the digestive system: Secondary | ICD-10-CM | POA: Diagnosis not present

## 2023-11-10 DIAGNOSIS — R194 Change in bowel habit: Secondary | ICD-10-CM | POA: Diagnosis not present

## 2023-11-10 DIAGNOSIS — Z7984 Long term (current) use of oral hypoglycemic drugs: Secondary | ICD-10-CM

## 2023-11-10 DIAGNOSIS — E119 Type 2 diabetes mellitus without complications: Secondary | ICD-10-CM | POA: Diagnosis not present

## 2023-11-10 DIAGNOSIS — K2270 Barrett's esophagus without dysplasia: Secondary | ICD-10-CM

## 2023-11-10 NOTE — Patient Instructions (Signed)
 It was good to see you again today!  Since you continue to have bowel symptoms, we need to stop your metformin for 3 months to see if this makes a difference.  We will check with the [facility] about stopping metformin.  When you return in 3 months if you are still having bowel issues off metformin, I would recommend a colonoscopy at that time.  Further recommendations to follow.

## 2023-11-10 NOTE — Progress Notes (Signed)
 Primary Care Physician:  Omie Bickers, MD Primary Gastroenterologist:  Dr. Riley Cheadle  Pre-Procedure History & Physical: HPI:  [name] is a 78 y.o. male here for follow-up of altered bowel function.  Patient continues to have loose bowels but infrequent may go 2 to 3 days without a bowel movement and then has a loose stool.  Less incontinence.  He is taking Benefiber daily.  No bleeding.  It has been 19 years since he had last had a colonoscopy-[place].  Diverticulosis.  He continues on metformin.  He denies abdominal pain;  he is able to eat,  his weight is stable. History of short segment Barrett's esophagus.  Not on a PPI at this time.  No dysphagia.  Past Medical History:  Diagnosis Date   Barrett esophagus    CAD (coronary artery disease)    Carotid body tumor (HCC)    Depression    Diabetes mellitus without complication (HCC)    Essential hypertension    GERD (gastroesophageal reflux disease)    Mixed hyperlipidemia    PTSD (post-traumatic stress disorder)     Past Surgical History:  Procedure Laterality Date   BIOPSY  09/18/2022   Procedure: BIOPSY;  Surgeon: Suzette Espy, MD;  [facility];  Service: Endoscopy;;   CARPAL TUNNEL RELEASE Bilateral    ESOPHAGOGASTRODUODENOSCOPY (EGD) WITH PROPOFOL  N/A 09/18/2022   Procedure: ESOPHAGOGASTRODUODENOSCOPY (EGD) WITH PROPOFOL ;  Surgeon: Suzette Espy, MD;  [facility];  Service: Endoscopy;  Laterality: N/A;  2:15 pm, asa 3   MALONEY DILATION N/A 09/18/2022   Procedure: MALONEY DILATION;  Surgeon: Suzette Espy, MD;  [facility];  Service: Endoscopy;  Laterality: N/A;   ORIF ANKLE FRACTURE Left    ORTHOPEDIC SURGERY     left knee arthroplasty    Prior to Admission medications   Medication Sig Start Date End Date Taking? Authorizing Provider  amLODipine  (NORVASC ) 5 MG tablet TAKE ONE TABLET BY MOUTH ONCE DAILY. 09/04/20  Yes Gerard Knight, MD  aspirin  EC 81 MG tablet Take 1 tablet  (81 mg total) by mouth daily. 08/16/19  Yes Gerard Knight, MD  Cholecalciferol (VITAMIN D-1000 MAX ST) 25 MCG (1000 UT) tablet Take by mouth. 11/08/21  Yes [provider]  lisinopril (ZESTRIL) 40 MG tablet Take 40 mg by mouth daily.   Yes [provider]  metFORMIN (GLUCOPHAGE) 1000 MG tablet Take by mouth. 06/13/23  Yes [provider]  rosuvastatin  (CRESTOR ) 20 MG tablet Take 1 tablet by mouth daily. 08/21/21  Yes [provider]    Allergies as of 11/10/2023 - Review Complete 11/10/2023  Allergen Reaction Noted   Simvastatin Rash 10/08/2010    Family History  Problem Relation Age of Onset   CVA Father        Cerebral hemorrhage    Social History   Socioeconomic History   Marital status: Married    Spouse name: Not on file   Number of children: Not on file   Years of education: Not on file   Highest education level: Not on file  Occupational History   Not on file  Tobacco Use   Smoking status: Former    Current packs/day: 0.00    Average packs/day: 1 pack/day for 2.0 years (2.0 ttl pk-yrs)    Types: Cigarettes    Start date: 63    Quit date: 12    Years since quitting: 50.3   Smokeless tobacco: Never  Vaping  Use   Vaping status: Never Used  Substance and Sexual Activity   Alcohol use: Never   Drug use: Never   Sexual activity: Not Currently  Other Topics Concern   Not on file  Social History Narrative   Right handed   Lives with wife in one story home   Social Drivers of Health   Financial Resource Strain: Low Risk  (12/18/2022)   Received from [facility]   Overall Financial Resource Strain (CARDIA)    Difficulty of Paying Living Expenses: Not hard at all  Food Insecurity: No Food Insecurity (12/18/2022)   Received from [facility]   Hunger Vital Sign    Worried About Running Out of Food in the Last Year: Never true    Ran Out of Food in the Last Year: Never true  Transportation  Needs: No Transportation Needs (12/18/2022)   Received [facility] - [facility]    In the past 12 months, has lack of transportation kept you from medical appointments or from getting medications?: No    Lack of Transportation (Non-Medical): No  Physical Activity: Not on file  Stress: Not on file  Social Connections: Not on file  Intimate Partner Violence: Not At Risk (04/22/2022)   Humiliation, Afraid, Rape, and Kick questionnaire    Fear of Current or Ex-Partner: No    Emotionally Abused: No    Physically Abused: No    Sexually Abused: No    Review of Systems: See HPI, otherwise negative ROS  Physical Exam: BP 126/72 (BP Location: Right Arm, Patient Position: Sitting, Cuff Size: Normal)   Pulse (!) 49   Temp 97.7 F (36.5 C) (Oral)   Ht 5\' 4"  (1.626 m)   Wt 192 lb 12.8 oz (87.5 kg)   SpO2 99%   BMI 33.09 kg/m  General:   Alert,  Well-developed, well-nourished, pleasant and cooperative in NAD Neck:  Supple; no masses or thyromegaly. No significant cervical adenopathy. Lungs:  Clear throughout to auscultation.   No wheezes, crackles, or rhonchi. No acute distress. Heart:  Regular rate and rhythm; no murmurs, clicks, rubs,  or gallops. Abdomen: Non-distended, normal bowel sounds.  Soft and nontender without appreciable mass or hepatosplenomegaly.   Impression/Plan: 78 year old gentleman longstanding type 2 diabetes mellitus returns for evaluation of altered bowel function.  He goes 3 to 4 days without a bowel movement and then has a poorly formed stool.  He is not impacted based on prior DRE.  Does not appear to have out an outlet diarrhea.  I continue to be concerned about the possibility of metformin contributing to his bowel symptoms.  History of short segment Barrett's esophagus on no PPI no reflux at this time.  Patient not interested in taking acid suppression therapy.  Recommendations:  Stop metformin for 3 months. We will consult with  the [facility] about this maneuver.  If no improvement in bowel function, we will consider colonoscopy.   When you return in 3 months if you are still having bowel issues off metformin, I would recommend a colonoscopy at that time.  Generally plan for 1 more surveillance EGD within the next year.  Further recommendations to follow.      Notice: This dictation was prepared with Dragon dictation along with smaller phrase technology. Any transcriptional errors that result from this process are unintentional and may not be corrected upon review.

## 2023-12-08 DIAGNOSIS — E1169 Type 2 diabetes mellitus with other specified complication: Secondary | ICD-10-CM | POA: Diagnosis not present

## 2023-12-08 DIAGNOSIS — I10 Essential (primary) hypertension: Secondary | ICD-10-CM | POA: Diagnosis not present

## 2023-12-08 DIAGNOSIS — E039 Hypothyroidism, unspecified: Secondary | ICD-10-CM | POA: Diagnosis not present

## 2023-12-08 DIAGNOSIS — E782 Mixed hyperlipidemia: Secondary | ICD-10-CM | POA: Diagnosis not present

## 2023-12-09 ENCOUNTER — Ambulatory Visit (INDEPENDENT_AMBULATORY_CARE_PROVIDER_SITE_OTHER): Admitting: Orthopaedic Surgery

## 2023-12-09 ENCOUNTER — Encounter: Payer: Self-pay | Admitting: Orthopaedic Surgery

## 2023-12-09 ENCOUNTER — Other Ambulatory Visit (INDEPENDENT_AMBULATORY_CARE_PROVIDER_SITE_OTHER)

## 2023-12-09 DIAGNOSIS — M79642 Pain in left hand: Secondary | ICD-10-CM

## 2023-12-09 NOTE — Patient Instructions (Signed)
 Referral has been sent to Dr. Daisey Dryer office at [facility], they will call to get you scheduled.   Follow up in 2 weeks with Dr. Iline Mallory once you have the EMG done.

## 2023-12-09 NOTE — Progress Notes (Signed)
 My hand hurts.  He has pain of the left ring finger and numbness at times.  He has bitten by a dog on the left hand about three years ago.  He cannot fully flex all the fingers of the left hand since then.  He has had increasing numbness of the left ring finger over the last few months  He has no redness, no swelling.  He has decreased motion in flexion of all the fingers on the left hand.  Left ring has decreased sensation.  He has no locking.  He has no redness or swelling.  Intrinsic muscles of left hand weaker than on the right dominant hand.  Encounter Diagnosis  Name Primary?   Pain in left hand Yes   I will get EMGs of the left hand.  He is post carpal tunnel on that hand years ago.  I am concerned about the ulnar nerve now.  Begin Aleve one twice a day.  Return in two weeks.  Call if any problem.  Precautions discussed.  Electronically Signed Pleasant Brilliant, MD 5/21/20259:17 AM

## 2023-12-15 DIAGNOSIS — M65341 Trigger finger, right ring finger: Secondary | ICD-10-CM | POA: Insufficient documentation

## 2023-12-21 DIAGNOSIS — I10 Essential (primary) hypertension: Secondary | ICD-10-CM | POA: Diagnosis not present

## 2023-12-21 DIAGNOSIS — E1169 Type 2 diabetes mellitus with other specified complication: Secondary | ICD-10-CM | POA: Diagnosis not present

## 2023-12-21 DIAGNOSIS — E039 Hypothyroidism, unspecified: Secondary | ICD-10-CM | POA: Diagnosis not present

## 2023-12-21 DIAGNOSIS — E782 Mixed hyperlipidemia: Secondary | ICD-10-CM | POA: Diagnosis not present

## 2023-12-21 DIAGNOSIS — R29898 Other symptoms and signs involving the musculoskeletal system: Secondary | ICD-10-CM | POA: Diagnosis not present

## 2023-12-21 DIAGNOSIS — M79642 Pain in left hand: Secondary | ICD-10-CM | POA: Diagnosis not present

## 2023-12-21 DIAGNOSIS — Z8673 Personal history of transient ischemic attack (TIA), and cerebral infarction without residual deficits: Secondary | ICD-10-CM | POA: Diagnosis not present

## 2023-12-21 DIAGNOSIS — K2270 Barrett's esophagus without dysplasia: Secondary | ICD-10-CM | POA: Diagnosis not present

## 2023-12-21 DIAGNOSIS — E559 Vitamin D deficiency, unspecified: Secondary | ICD-10-CM | POA: Diagnosis not present

## 2023-12-22 DIAGNOSIS — M21072 Valgus deformity, not elsewhere classified, left ankle: Secondary | ICD-10-CM | POA: Diagnosis not present

## 2023-12-22 DIAGNOSIS — E1169 Type 2 diabetes mellitus with other specified complication: Secondary | ICD-10-CM | POA: Diagnosis not present

## 2023-12-22 DIAGNOSIS — Z1211 Encounter for screening for malignant neoplasm of colon: Secondary | ICD-10-CM | POA: Insufficient documentation

## 2023-12-22 DIAGNOSIS — E118 Type 2 diabetes mellitus with unspecified complications: Secondary | ICD-10-CM | POA: Insufficient documentation

## 2023-12-22 DIAGNOSIS — M19172 Post-traumatic osteoarthritis, left ankle and foot: Secondary | ICD-10-CM | POA: Diagnosis not present

## 2023-12-22 DIAGNOSIS — Z96662 Presence of left artificial ankle joint: Secondary | ICD-10-CM | POA: Diagnosis not present

## 2023-12-22 DIAGNOSIS — M25519 Pain in unspecified shoulder: Secondary | ICD-10-CM | POA: Insufficient documentation

## 2023-12-22 DIAGNOSIS — M19072 Primary osteoarthritis, left ankle and foot: Secondary | ICD-10-CM | POA: Diagnosis not present

## 2023-12-23 ENCOUNTER — Ambulatory Visit (INDEPENDENT_AMBULATORY_CARE_PROVIDER_SITE_OTHER): Admitting: Orthopaedic Surgery

## 2023-12-23 ENCOUNTER — Encounter: Payer: Self-pay | Admitting: Orthopaedic Surgery

## 2023-12-23 VITALS — BP 101/43 | HR 63 | Ht 64.0 in | Wt 192.0 lb

## 2023-12-23 DIAGNOSIS — M79642 Pain in left hand: Secondary | ICD-10-CM

## 2023-12-23 NOTE — Progress Notes (Signed)
 His EMGs will be done Friday, June 6.  Due to scheduling error, he is here today.  Nothing has changed.  No treatment, no charge.  Return in one week.  I apologized for the mix-up.  Call if any problem.  Precautions discussed.  Electronically Signed Pleasant Brilliant, MD 6/4/20258:06 AM

## 2023-12-25 ENCOUNTER — Encounter: Admitting: Physical Medicine and Rehabilitation

## 2023-12-29 ENCOUNTER — Encounter: Payer: Self-pay | Admitting: Internal Medicine

## 2024-01-06 ENCOUNTER — Ambulatory Visit: Admitting: Orthopaedic Surgery

## 2024-01-13 ENCOUNTER — Ambulatory Visit: Admitting: Physical Medicine and Rehabilitation

## 2024-01-13 DIAGNOSIS — R202 Paresthesia of skin: Secondary | ICD-10-CM | POA: Diagnosis not present

## 2024-01-13 DIAGNOSIS — R531 Weakness: Secondary | ICD-10-CM

## 2024-01-13 DIAGNOSIS — M79642 Pain in left hand: Secondary | ICD-10-CM | POA: Diagnosis not present

## 2024-01-13 NOTE — Progress Notes (Signed)
 [name] - 78 y.o. male MRN [id number]  Date of birth: 05-14-1946  Office Visit Note: Visit Date: 01/13/2024 PCP: Shona Norleen PEDLAR, MD Referred by: Brenna Lin, MD  Subjective: Chief Complaint  Patient presents with   Left Hand - Numbness, Weakness, Pain   HPI: [name] is a 78 y.o. male who comes in today at the request of Dr. Lin Brenna for evaluation and management of chronic, worsening and severe pain, numbness and tingling in the Left upper extremities.  Patient is Right hand dominant.  He presents today complaining of pain in his left hand with tingling and paresthesia of the left ring finger at times.  He is already followed by Dr. Carlin Galla at [facility] who used to be a Hydrographic surveyor in our office.  He was last seen in that office last month.  It appears from those notes that he has had a history of trigger finger in the ring finger of both hands.  He has had corticosteroid injections of those fingers in the past.  He reports a history of prior carpal tunnel release bilaterally age-indeterminate.  I do not have any records of prior electrodiagnostic study.  He reports having a difficult time flexing his fingers on the left.  He has a remote history of a dog bite that he feels like hurt his grip to some degree.  He does not really endorse tingling numbness in the other digits.  He has had no frank radicular symptoms down the arm.  He does again report weakness.  He has a history of type 2 diabetes without history of polyneuropathy.   I spent more than 30 minutes speaking face-to-face with the patient with 50% of the time in counseling and discussing coordination of care.      Review of Systems  All other systems reviewed and are negative.  Otherwise per HPI.  Assessment & Plan: Visit Diagnoses:    ICD-10-CM   1. Paresthesia of skin  R20.2 NCV with EMG (electromyography)    2. Weakness  R53.1 NCV with EMG (electromyography)    3. Pain in left hand  M79.642         Plan: Impression: Clinically could be a median neuropathy only affecting the ring finger but he does get overall weakness.  History of carpal tunnel surgery and trigger finger treated by Dr. Carlin Galla at [facility].  Electrodiagnostic study performed today.  The above electrodiagnostic study is ABNORMAL and reveals evidence of a mild to moderate left median nerve entrapment at the wrist (carpal tunnel syndrome) affecting sensory and motor components.  Careful clinical correlation is paramount.  There is no significant electrodiagnostic evidence of any other focal nerve entrapment, brachial plexopathy or cervical radiculopathy.   Recommendations: 1.  Follow-up with referring physician. 2.  Continue current management of symptoms. 3.  Continue use of resting splint at night-time and as needed during the day.  Meds & Orders: No orders of the defined types were placed in this encounter.   Orders Placed This Encounter  Procedures   NCV with EMG (electromyography)    Follow-up: Return for Lin Brenna, MD.   Procedures: No procedures performed  EMG & NCV Findings: Evaluation of the left median motor nerve showed prolonged distal onset latency (4.3 ms).  The left median (across palm) sensory nerve showed prolonged distal peak latency (Wrist, 4.0 ms).  All remaining nerves (as indicated in the following tables) were within normal limits.    All examined muscles (as  indicated in the following table) showed no evidence of electrical instability.    Impression: The above electrodiagnostic study is ABNORMAL and reveals evidence of a mild to moderate left median nerve entrapment at the wrist (carpal tunnel syndrome) affecting sensory and motor components.  Careful clinical correlation is paramount.  There is no significant electrodiagnostic evidence of any other focal nerve entrapment, brachial plexopathy or cervical radiculopathy.   Recommendations: 1.  Follow-up with referring  physician. 2.  Continue current management of symptoms. 3.  Continue use of resting splint at night-time and as needed during the day.  ___________________________ Prentice Eldonna BETTERS Board Certified, American Board of Physical Medicine and Rehabilitation    Nerve Conduction Studies Anti Sensory Summary Table   Stim Site NR Peak (ms) Norm Peak (ms) P-T Amp (V) Norm P-T Amp Site1 Site2 Delta-P (ms) Dist (cm) Vel (m/s) Norm Vel (m/s)  Left Median Acr Palm Anti Sensory (2nd Digit)  30.2C  Wrist    *4.0 <3.6 21.1 >10 Wrist Palm 2.2 0.0    Palm    1.8 <2.0 6.1         Left Radial Anti Sensory (Base 1st Digit)  29.9C  Wrist    2.2 <3.1 22.1  Wrist Base 1st Digit 2.2 0.0    Left Ulnar Anti Sensory (5th Digit)  30.4C  Wrist    3.6 <3.7 21.4 >15.0 Wrist 5th Digit 3.6 14.0 39 >38   Motor Summary Table   Stim Site NR Onset (ms) Norm Onset (ms) O-P Amp (mV) Norm O-P Amp Site1 Site2 Delta-0 (ms) Dist (cm) Vel (m/s) Norm Vel (m/s)  Left Median Motor (Abd Poll Brev)  30.4C  Wrist    *4.3 <4.2 5.8 >5 Elbow Wrist 4.4 22.0 50 >50  Elbow    8.7  5.2         Left Ulnar Motor (Abd Dig Min)  30.3C  Wrist    3.8 <4.2 5.2 >3 B Elbow Wrist 3.3 19.0 58 >53  B Elbow    7.1  6.2  A Elbow B Elbow 1.3 10.0 77 >53  A Elbow    8.4  6.3          EMG   Side Muscle Nerve Root Ins Act Fibs Psw Amp Dur Poly Recrt Int Bruna Comment  Left Abd Poll Brev Median C8-T1 Nml Nml Nml Nml Nml 0 Nml Nml   Left 1stDorInt Ulnar C8-T1 Nml Nml Nml Nml Nml 0 Nml Nml   Left PronatorTeres Median C6-7 Nml Nml Nml Nml Nml 0 Nml Nml   Left Biceps Musculocut C5-6 Nml Nml Nml Nml Nml 0 Nml Nml   Left Deltoid Axillary C5-6 Nml Nml Nml Nml Nml 0 Nml Nml     Nerve Conduction Studies Anti Sensory Left/Right Comparison   Stim Site L Lat (ms) R Lat (ms) L-R Lat (ms) L Amp (V) R Amp (V) L-R Amp (%) Site1 Site2 L Vel (m/s) R Vel (m/s) L-R Vel (m/s)  Median Acr Palm Anti Sensory (2nd Digit)  30.2C  Wrist *4.0   21.1   Wrist Palm      Palm 1.8   6.1         Radial Anti Sensory (Base 1st Digit)  29.9C  Wrist 2.2   22.1   Wrist Base 1st Digit     Ulnar Anti Sensory (5th Digit)  30.4C  Wrist 3.6   21.4   Wrist 5th Digit 39     Motor Left/Right Comparison   Stim  Site L Lat (ms) R Lat (ms) L-R Lat (ms) L Amp (mV) R Amp (mV) L-R Amp (%) Site1 Site2 L Vel (m/s) R Vel (m/s) L-R Vel (m/s)  Median Motor (Abd Poll Brev)  30.4C  Wrist *4.3   5.8   Elbow Wrist 50    Elbow 8.7   5.2         Ulnar Motor (Abd Dig Min)  30.3C  Wrist 3.8   5.2   B Elbow Wrist 58    B Elbow 7.1   6.2   A Elbow B Elbow 77    A Elbow 8.4   6.3            Waveforms:            Clinical History: No specialty comments available.   He reports that he quit smoking about 50 years ago. His smoking use included cigarettes. He started smoking about 52 years ago. He has a 2 pack-year smoking history. He has never used smokeless tobacco. No results for input(s): HGBA1C, LABURIC in the last 8760 hours.  Objective:  VS:  HT:    WT:   BMI:     BP:   HR: bpm  TEMP: ( )  RESP:  Physical Exam Vitals and nursing note reviewed.  Constitutional:      General: He is not in acute distress.    Appearance: Normal appearance. He is well-developed.  HENT:     Head: Normocephalic and atraumatic.   Eyes:     Conjunctiva/sclera: Conjunctivae normal.     Pupils: Pupils are equal, round, and reactive to light.    Cardiovascular:     Rate and Rhythm: Normal rate.     Pulses: Normal pulses.     Heart sounds: Normal heart sounds.  Pulmonary:     Effort: Pulmonary effort is normal. No respiratory distress.   Musculoskeletal:        General: No tenderness.     Cervical back: Normal range of motion and neck supple. No rigidity.     Right lower leg: No edema.     Left lower leg: No edema.     Comments: Inspection reveals no atrophy of the bilateral APB or FDI or hand intrinsics. There is no swelling, color changes, allodynia or dystrophic changes.  There is 5 out of 5 strength in the bilateral wrist extension, finger abduction and long finger flexion. There is intact sensation to light touch in all dermatomal and peripheral nerve distributions.  There is a negative Tinel's test at the bilateral wrist and elbow. There is a negative Phalen's test bilaterally. There is a negative Hoffmann's test bilaterally.   Skin:    General: Skin is warm and dry.     Findings: No erythema or rash.   Neurological:     General: No focal deficit present.     Mental Status: He is alert and oriented to person, place, and time.     Sensory: No sensory deficit.     Motor: No weakness or abnormal muscle tone.     Coordination: Coordination normal.     Gait: Gait normal.   Psychiatric:        Mood and Affect: Mood normal.        Behavior: Behavior normal.        Thought Content: Thought content normal.     Ortho Exam  Imaging: No results found.  Past Medical/Family/Surgical/Social History: Medications & Allergies reviewed per EMR, new medications updated. Patient Active  Problem List   Diagnosis Date Noted   Arthralgia of shoulder 12/22/2023   Type 2 diabetes mellitus with unspecified complications (HCC) 12/22/2023   Encounter for screening for malignant neoplasm of colon 12/22/2023   Acquired trigger finger of right ring finger 12/15/2023   Poor appetite 10/08/2022   Early satiety 09/16/2022   Loss of weight 09/16/2022   Barrett's esophagus 07/18/2022   Diarrhea 07/18/2022   Acute metabolic encephalopathy 04/22/2022   Ataxia 04/21/2022   Acquired trigger finger of left ring finger 09/27/2021   Urge incontinence of urine 05/20/2021   Coronary artery disease involving native coronary artery of native heart with angina pectoris (HCC) 09/11/2020   Mixed hyperlipidemia 09/11/2020   Essential hypertension 09/11/2020   Tobacco abuse, in remission 09/11/2020   Acid reflux    Past Medical History:  Diagnosis Date   Barrett esophagus    CAD  (coronary artery disease)    Carotid body tumor (HCC)    Depression    Diabetes mellitus without complication (HCC)    Essential hypertension    GERD (gastroesophageal reflux disease)    Mixed hyperlipidemia    PTSD (post-traumatic stress disorder)    Family History  Problem Relation Age of Onset   CVA Father        Cerebral hemorrhage   Past Surgical History:  Procedure Laterality Date   BIOPSY  09/18/2022   Procedure: BIOPSY;  Surgeon: Shaaron [name], MD;  [facility];  Service: Endoscopy;;   CARPAL TUNNEL RELEASE Bilateral    ESOPHAGOGASTRODUODENOSCOPY (EGD) WITH PROPOFOL  N/A 09/18/2022   Procedure: ESOPHAGOGASTRODUODENOSCOPY (EGD) WITH PROPOFOL ;  Surgeon: Shaaron [name], MD;  [facility];  Service: Endoscopy;  Laterality: N/A;  2:15 pm, asa 3   MALONEY DILATION N/A 09/18/2022   Procedure: MALONEY DILATION;  Surgeon: Shaaron [name], MD;  [facility]: [facility];  Service: Endoscopy;  Laterality: N/A;   ORIF ANKLE FRACTURE Left    ORTHOPEDIC SURGERY     left knee arthroplasty   Social History   Occupational History   Not on file  Tobacco Use   Smoking status: Former    Current packs/day: 0.00    Average packs/day: 1 pack/day for 2.0 years (2.0 ttl pk-yrs)    Types: Cigarettes    Start date: 96    Quit date: 69    Years since quitting: 50.5   Smokeless tobacco: Never  Vaping Use   Vaping status: Never Used  Substance and Sexual Activity   Alcohol use: Never   Drug use: Never   Sexual activity: Not Currently

## 2024-01-13 NOTE — Progress Notes (Signed)
 Pain Scale   Average Pain 3 Patient advising he has pain , weakness and numbness in his left ring finger. Patient advising he is right hand dominate.        +Driver, -BT, -Dye Allergies.

## 2024-01-15 DIAGNOSIS — E782 Mixed hyperlipidemia: Secondary | ICD-10-CM | POA: Diagnosis not present

## 2024-01-15 DIAGNOSIS — I10 Essential (primary) hypertension: Secondary | ICD-10-CM | POA: Diagnosis not present

## 2024-01-15 DIAGNOSIS — M79642 Pain in left hand: Secondary | ICD-10-CM | POA: Diagnosis not present

## 2024-01-15 DIAGNOSIS — R29898 Other symptoms and signs involving the musculoskeletal system: Secondary | ICD-10-CM | POA: Diagnosis not present

## 2024-01-15 DIAGNOSIS — Z8673 Personal history of transient ischemic attack (TIA), and cerebral infarction without residual deficits: Secondary | ICD-10-CM | POA: Diagnosis not present

## 2024-01-15 DIAGNOSIS — E559 Vitamin D deficiency, unspecified: Secondary | ICD-10-CM | POA: Diagnosis not present

## 2024-01-15 DIAGNOSIS — E039 Hypothyroidism, unspecified: Secondary | ICD-10-CM | POA: Diagnosis not present

## 2024-01-15 DIAGNOSIS — K2270 Barrett's esophagus without dysplasia: Secondary | ICD-10-CM | POA: Diagnosis not present

## 2024-01-15 DIAGNOSIS — E1169 Type 2 diabetes mellitus with other specified complication: Secondary | ICD-10-CM | POA: Diagnosis not present

## 2024-01-15 NOTE — Procedures (Unsigned)
 EMG & NCV Findings: Evaluation of the left median motor nerve showed prolonged distal onset latency (4.3 ms).  The left median (across palm) sensory nerve showed prolonged distal peak latency (Wrist, 4.0 ms).  All remaining nerves (as indicated in the following tables) were within normal limits.    All examined muscles (as indicated in the following table) showed no evidence of electrical instability.    Impression: The above electrodiagnostic study is ABNORMAL and reveals evidence of a mild to moderate left median nerve entrapment at the wrist (carpal tunnel syndrome) affecting sensory and motor components.  Careful clinical correlation is paramount.  There is no significant electrodiagnostic evidence of any other focal nerve entrapment, brachial plexopathy or cervical radiculopathy.   Recommendations: 1.  Follow-up with referring physician. 2.  Continue current management of symptoms. 3.  Continue use of resting splint at night-time and as needed during the day.  ___________________________ [name] Board Certified, American Board of Physical Medicine and Rehabilitation    Nerve Conduction Studies Anti Sensory Summary Table   Stim Site NR Peak (ms) Norm Peak (ms) P-T Amp (V) Norm P-T Amp Site1 Site2 Delta-P (ms) Dist (cm) Vel (m/s) Norm Vel (m/s)  Left Median Acr Palm Anti Sensory (2nd Digit)  30.2C  Wrist    *4.0 <3.6 21.1 >10 Wrist Palm 2.2 0.0    Palm    1.8 <2.0 6.1         Left Radial Anti Sensory (Base 1st Digit)  29.9C  Wrist    2.2 <3.1 22.1  Wrist Base 1st Digit 2.2 0.0    Left Ulnar Anti Sensory (5th Digit)  30.4C  Wrist    3.6 <3.7 21.4 >15.0 Wrist 5th Digit 3.6 14.0 39 >38   Motor Summary Table   Stim Site NR Onset (ms) Norm Onset (ms) O-P Amp (mV) Norm O-P Amp Site1 Site2 Delta-0 (ms) Dist (cm) Vel (m/s) Norm Vel (m/s)  Left Median Motor (Abd Poll Brev)  30.4C  Wrist    *4.3 <4.2 5.8 >5 Elbow Wrist 4.4 22.0 50 >50  Elbow    8.7  5.2         Left  Ulnar Motor (Abd Dig Min)  30.3C  Wrist    3.8 <4.2 5.2 >3 B Elbow Wrist 3.3 19.0 58 >53  B Elbow    7.1  6.2  A Elbow B Elbow 1.3 10.0 77 >53  A Elbow    8.4  6.3          EMG   Side Muscle Nerve Root Ins Act Fibs Psw Amp Dur Poly Recrt Int Bruna Comment  Left Abd Poll Brev Median C8-T1 Nml Nml Nml Nml Nml 0 Nml Nml   Left 1stDorInt Ulnar C8-T1 Nml Nml Nml Nml Nml 0 Nml Nml   Left PronatorTeres Median C6-7 Nml Nml Nml Nml Nml 0 Nml Nml   Left Biceps Musculocut C5-6 Nml Nml Nml Nml Nml 0 Nml Nml   Left Deltoid Axillary C5-6 Nml Nml Nml Nml Nml 0 Nml Nml     Nerve Conduction Studies Anti Sensory Left/Right Comparison   Stim Site L Lat (ms) R Lat (ms) L-R Lat (ms) L Amp (V) R Amp (V) L-R Amp (%) Site1 Site2 L Vel (m/s) R Vel (m/s) L-R Vel (m/s)  Median Acr Palm Anti Sensory (2nd Digit)  30.2C  Wrist *4.0   21.1   Wrist Palm     Palm 1.8   6.1  Radial Anti Sensory (Base 1st Digit)  29.9C  Wrist 2.2   22.1   Wrist Base 1st Digit     Ulnar Anti Sensory (5th Digit)  30.4C  Wrist 3.6   21.4   Wrist 5th Digit 39     Motor Left/Right Comparison   Stim Site L Lat (ms) R Lat (ms) L-R Lat (ms) L Amp (mV) R Amp (mV) L-R Amp (%) Site1 Site2 L Vel (m/s) R Vel (m/s) L-R Vel (m/s)  Median Motor (Abd Poll Brev)  30.4C  Wrist *4.3   5.8   Elbow Wrist 50    Elbow 8.7   5.2         Ulnar Motor (Abd Dig Min)  30.3C  Wrist 3.8   5.2   B Elbow Wrist 58    B Elbow 7.1   6.2   A Elbow B Elbow 77    A Elbow 8.4   6.3            Waveforms:

## 2024-01-18 ENCOUNTER — Encounter: Payer: Self-pay | Admitting: Physical Medicine and Rehabilitation

## 2024-01-18 DIAGNOSIS — E039 Hypothyroidism, unspecified: Secondary | ICD-10-CM | POA: Diagnosis not present

## 2024-01-18 DIAGNOSIS — E782 Mixed hyperlipidemia: Secondary | ICD-10-CM | POA: Diagnosis not present

## 2024-01-18 DIAGNOSIS — E1169 Type 2 diabetes mellitus with other specified complication: Secondary | ICD-10-CM | POA: Diagnosis not present

## 2024-01-19 ENCOUNTER — Encounter: Payer: Self-pay | Admitting: Internal Medicine

## 2024-01-19 ENCOUNTER — Ambulatory Visit (INDEPENDENT_AMBULATORY_CARE_PROVIDER_SITE_OTHER): Admitting: Internal Medicine

## 2024-01-19 VITALS — BP 135/72 | HR 65 | Temp 97.7°F | Ht 64.0 in | Wt 190.6 lb

## 2024-01-19 DIAGNOSIS — K2270 Barrett's esophagus without dysplasia: Secondary | ICD-10-CM

## 2024-01-19 DIAGNOSIS — I10 Essential (primary) hypertension: Secondary | ICD-10-CM | POA: Diagnosis not present

## 2024-01-19 DIAGNOSIS — I700 Atherosclerosis of aorta: Secondary | ICD-10-CM | POA: Diagnosis not present

## 2024-01-19 DIAGNOSIS — E782 Mixed hyperlipidemia: Secondary | ICD-10-CM | POA: Diagnosis not present

## 2024-01-19 DIAGNOSIS — R194 Change in bowel habit: Secondary | ICD-10-CM

## 2024-01-19 DIAGNOSIS — K219 Gastro-esophageal reflux disease without esophagitis: Secondary | ICD-10-CM | POA: Diagnosis not present

## 2024-01-19 DIAGNOSIS — E039 Hypothyroidism, unspecified: Secondary | ICD-10-CM | POA: Diagnosis not present

## 2024-01-19 DIAGNOSIS — E1169 Type 2 diabetes mellitus with other specified complication: Secondary | ICD-10-CM | POA: Diagnosis not present

## 2024-01-19 DIAGNOSIS — M25551 Pain in right hip: Secondary | ICD-10-CM | POA: Diagnosis not present

## 2024-01-19 DIAGNOSIS — E1165 Type 2 diabetes mellitus with hyperglycemia: Secondary | ICD-10-CM | POA: Diagnosis not present

## 2024-01-19 DIAGNOSIS — K5900 Constipation, unspecified: Secondary | ICD-10-CM

## 2024-01-19 DIAGNOSIS — D649 Anemia, unspecified: Secondary | ICD-10-CM | POA: Diagnosis not present

## 2024-01-19 DIAGNOSIS — E559 Vitamin D deficiency, unspecified: Secondary | ICD-10-CM | POA: Diagnosis not present

## 2024-01-19 NOTE — Progress Notes (Signed)
 Primary Care Physician:  Shona Norleen PEDLAR, MD Primary Gastroenterologist:  Dr. Shaaron  Pre-Procedure History & Physical: HPI:  [name] is a 78 y.o. male here for altered bowel function.  Baseline chronic constipation when he would have a bowel movement every couple of days they would be loose and hard to control.  MiraLAX  never help promote regular bowel function.  He stopped metformin and the loose bowels subsided however he has pretty much pure constipation  -  now having a bowel movement every 2 to 3 days.  No bleeding.  Negative colonoscopy 2004.  Short segment Barrett's in February 2024 no dysplasia.  No dysphagia after esophagus was dilated.  He is not on a PPI.  Past Medical History:  Diagnosis Date   Barrett esophagus    CAD (coronary artery disease)    Carotid body tumor (HCC)    Depression    Diabetes mellitus without complication (HCC)    Essential hypertension    GERD (gastroesophageal reflux disease)    Mixed hyperlipidemia    PTSD (post-traumatic stress disorder)     Past Surgical History:  Procedure Laterality Date   BIOPSY  09/18/2022   Procedure: BIOPSY;  Surgeon: Shaaron [name], MD;  [facility];  Service: Endoscopy;;   CARPAL TUNNEL RELEASE Bilateral    ESOPHAGOGASTRODUODENOSCOPY (EGD) WITH PROPOFOL  N/A 09/18/2022   Procedure: ESOPHAGOGASTRODUODENOSCOPY (EGD) WITH PROPOFOL ;  Surgeon: Shaaron [name], MD;  [facility];  Service: Endoscopy;  Laterality: N/A;  2:15 pm, asa 3   MALONEY DILATION N/A 09/18/2022   Procedure: MALONEY DILATION;  Surgeon: Shaaron [name], MD;  [facility];  Service: Endoscopy;  Laterality: N/A;   ORIF ANKLE FRACTURE Left    ORTHOPEDIC SURGERY     left knee arthroplasty    Prior to Admission medications   Medication Sig Start Date End Date Taking? Authorizing Provider  amLODipine  (NORVASC ) 5 MG tablet TAKE ONE TABLET BY MOUTH ONCE DAILY. 09/04/20  Yes Debera Jayson MATSU, MD  aspirin  EC 81 MG tablet  Take 1 tablet (81 mg total) by mouth daily. 08/16/19  Yes Debera Jayson MATSU, MD  Cholecalciferol (VITAMIN D-1000 MAX ST) 25 MCG (1000 UT) tablet Take by mouth. 11/08/21  Yes [provider]  empagliflozin (JARDIANCE) 25 MG TABS tablet Take 12.5 mg by mouth. 12/01/23  Yes [provider]  lisinopril (ZESTRIL) 40 MG tablet Take 40 mg by mouth daily.   Yes [provider]  rosuvastatin  (CRESTOR ) 20 MG tablet Take 1 tablet by mouth daily. 08/21/21  Yes [provider]    Allergies as of 01/19/2024 - Review Complete 01/19/2024  Allergen Reaction Noted   Simvastatin Rash 10/08/2010    Family History  Problem Relation Age of Onset   CVA Father        Cerebral hemorrhage    Social History   Socioeconomic History   Marital status: Married    Spouse name: Not on file   Number of children: Not on file   Years of education: Not on file   Highest education level: Not on file  Occupational History   Not on file  Tobacco Use   Smoking status: Former    Current packs/day: 0.00    Average packs/day: 1 pack/day for 2.0 years (2.0 ttl pk-yrs)    Types: Cigarettes    Start date: 62    Quit date: 53    Years since quitting: 50.5   Smokeless tobacco: Never  Vaping Use   Vaping status: Never Used  Substance and Sexual Activity   Alcohol use: Never   Drug use: Never   Sexual activity: Not Currently  Other Topics Concern   Not on file  Social History Narrative   Right handed   Lives with wife in one story home   Social Drivers of Health   Financial Resource Strain: Low Risk  (12/18/2022)   Received [facility]   Overall Financial Resource Strain (CARDIA)    Difficulty of Paying Living Expenses: Not hard at all  Food Insecurity: No Food Insecurity (12/18/2022)   Received from [facility]   Hunger Vital Sign    Within the past 12 months, you worried that your food would run out before you got the money to buy  more.: Never true    Within the past 12 months, the food you bought just didn't last and you didn't have money to get more.: Never true  Transportation Needs: No Transportation Needs (12/18/2022)   Received from [facility] - Transportation    In the past 12 months, has lack of transportation kept you from medical appointments or from getting medications?: No    Lack of Transportation (Non-Medical): No  Physical Activity: Not on file  Stress: Not on file  Social Connections: Not on file  Intimate Partner Violence: Not At Risk (04/22/2022)   Humiliation, Afraid, Rape, and Kick questionnaire    Fear of Current or Ex-Partner: No    Emotionally Abused: No    Physically Abused: No    Sexually Abused: No    Review of Systems: See HPI, otherwise negative ROS  Physical Exam: BP 135/72 (BP Location: Right Arm, Patient Position: Sitting, Cuff Size: Normal)   Pulse 65   Temp 97.7 F (36.5 C) (Oral)   Ht 5' 4 (1.626 m)   Wt 190 lb 9.6 oz (86.5 kg)   SpO2 97%   BMI 32.72 kg/m  General:   Alert,  pleasant and cooperative in NAD Lungs:  Clear throughout to auscultation.   No wheezes, crackles, or rhonchi. No acute distress. Heart:  Regular rate and rhythm; no murmurs, clicks, rubs,  or gallops. Abdomen: Non-distended, normal bowel sounds.  Soft and nontender without appreciable mass or hepatosplenomegaly.   Impression/Plan: Pleasant 78 year old gentleman short segment Barrett's esophagus without dysplasia.  No dysphagia after his esophagus was dilated.  Needs to be on a PPI.  Discussed the need to go on pantoprazole  40 mg daily.  He is agreeable.  Altered bowel function improved with withdrawal of metformin from his regimen.  He now has fairly pure constipation.  Did not feel MiraLAX  helped.  Recommendations:  We will try a course of Linzess 72 mcg gelcap 1 daily for 3 weeks.  Call in 3 weeks with a progress report. Begin Protonix  or pantoprazole  40 mg orally  daily 30 minutes before breakfast. 1 more upper endoscopy to look at  Barrett's esophagus in 4 years from now if overall health permits  Office visit here in 3 months.        Notice: This dictation was prepared with Dragon dictation along with smaller phrase technology. Any transcriptional errors that result from this process are unintentional and may not be corrected upon review.

## 2024-01-19 NOTE — Patient Instructions (Addendum)
 It was good to see you again today.  I am glad stopping metformin has helped your bowel dysfunction however, you are still constipated;  MiraLAX  did not help  We will try a course of Linzess 72 mcg gelcap 1 daily for 3 weeks.  Call me in 3 weeks and let me know how you are doing (scription or samples)  Begin Protonix  or pantoprazole  40 mg orally daily 30 minutes before breakfast to keep the acid reflux down in your esophagus.  1 more upper endoscopy to look at your Barrett's esophagus in 4 years from now if overall health permits  Office visit here in 3 months.

## 2024-01-20 ENCOUNTER — Ambulatory Visit: Admitting: Orthopaedic Surgery

## 2024-01-27 ENCOUNTER — Ambulatory Visit (INDEPENDENT_AMBULATORY_CARE_PROVIDER_SITE_OTHER): Admitting: Orthopaedic Surgery

## 2024-01-27 ENCOUNTER — Encounter: Payer: Self-pay | Admitting: Cardiology

## 2024-01-27 ENCOUNTER — Ambulatory Visit: Attending: Cardiology | Admitting: Cardiology

## 2024-01-27 ENCOUNTER — Encounter: Payer: Self-pay | Admitting: Orthopaedic Surgery

## 2024-01-27 VITALS — BP 120/60 | HR 50 | Ht 65.0 in | Wt 190.4 lb

## 2024-01-27 DIAGNOSIS — M65341 Trigger finger, right ring finger: Secondary | ICD-10-CM | POA: Diagnosis not present

## 2024-01-27 DIAGNOSIS — G5602 Carpal tunnel syndrome, left upper limb: Secondary | ICD-10-CM

## 2024-01-27 DIAGNOSIS — I10 Essential (primary) hypertension: Secondary | ICD-10-CM | POA: Diagnosis not present

## 2024-01-27 DIAGNOSIS — I25119 Atherosclerotic heart disease of native coronary artery with unspecified angina pectoris: Secondary | ICD-10-CM

## 2024-01-27 DIAGNOSIS — E782 Mixed hyperlipidemia: Secondary | ICD-10-CM

## 2024-01-27 NOTE — Progress Notes (Signed)
    Cardiology Office Note  Date: 01/27/2024   ID: [name], DOB [birth year], MRN [id number]  History of Present Illness: [name] is a 78 y.o. male last seen in May 2024.  He is here for a routine follow-up visit.  He does not report any exertional chest pain or breathlessness beyond NYHA class II.  No palpitations or syncope.  We went over his medications.  He reports compliance with therapy.  Blood pressure is normal today.  I reviewed his interval lab work including recent LDL of 47.  He continues to follow with Dr. Shona for primary care.  I reviewed his ECG today which shows sinus bradycardia.  Physical Exam: VS:  BP 120/60 (BP Location: Left Arm, Patient Position: Sitting, Cuff Size: Large)   Pulse (!) 50   Ht 5' 5 (1.651 m)   Wt 190 lb 6.4 oz (86.4 kg)   SpO2 96%   BMI 31.68 kg/m , BMI Body mass index is 31.68 kg/m.  Wt Readings from Last 3 Encounters:  01/27/24 190 lb 6.4 oz (86.4 kg)  01/19/24 190 lb 9.6 oz (86.5 kg)  12/23/23 192 lb (87.1 kg)    General: Patient appears comfortable at rest. HEENT: Conjunctiva and lids normal. Neck: Supple, no elevated JVP or carotid bruits. Lungs: Clear to auscultation, nonlabored breathing at rest. Cardiac: Regular rate and rhythm, no S3, 1/6 systolic murmur, no pericardial rub. Extremities: No pitting edema.  ECG:  An ECG dated 12/02/2022 was personally reviewed today and demonstrated:  Sinus bradycardia with LVH.  Labwork:  June 2025: TSH 2.69, hemoglobin 13.4, platelets 230, BUN 25, creatinine 1.15, GFR 65, potassium 4.5, AST 20, ALT 17, cholesterol 105, triglycerides 62, HDL 44, LDL 47  Other Studies Reviewed Today:  No interval cardiac testing for review today.  Assessment and Plan:  1.  Coronary calcium  score of 433 with obstructive stenosis involving OM branch by FFR imaging but otherwise nonobstructive disease in the major epicardials as of coronary CTA in January 2021.  LVEF 55 to 60% by echocardiogram in  October 2023.  He continues to do well with no angina and remains on medical therapy.  Continue aspirin  81 mg daily, Crestor  20 mg daily, he is also on Jardiance 25 mg daily.   2.  Mixed hyperlipidemia, LDL 47 in June.  Continue Crestor  20 mg daily.   3.  Primary hypertension.  Blood pressure well-controlled today.  Continue Norvasc  5 mg daily and lisinopril 40 mg daily.  Disposition:  Follow up 1 year.  Signed, Jayson JUDITHANN Sierras, M.D., F.A.C.C. [facility]

## 2024-01-27 NOTE — Patient Instructions (Signed)
 Medication Instructions:  Your physician recommends that you continue on your current medications as directed. Please refer to the Current Medication list given to you today.   Labwork: None today  Testing/Procedures: None today  Follow-Up: 1 year  Any Other Special Instructions Will Be Listed Below (If Applicable).  If you need a refill on your cardiac medications before your next appointment, please call your pharmacy.

## 2024-01-27 NOTE — Patient Instructions (Signed)
 See Dr. Onesimo or Dr. Margrette to discuss trigger release/carpal tunnel.

## 2024-01-27 NOTE — Progress Notes (Signed)
 I had that test done.  He had EMGs done showing mild to moderate left median nerve entrapment at the wrist.  I have reviewed the report.  He has also triggering of the right dominant ring finger.  He says the finger is the worst.  He wants something done for that.  He says the wrist can wait.  NV is intact to the right ring finger with pain over the A1 pulley area.  He has slight locking today.  Encounter Diagnoses  Name Primary?   Trigger finger, right ring finger Yes   Carpal tunnel syndrome, left upper limb    To see Dr. Onesimo or Dr. Margrette for surgery on the right ring finger.  Call if any problem.  Precautions discussed.  Electronically Signed Lemond Stable, MD 7/9/20252:10 PM

## 2024-02-05 ENCOUNTER — Encounter: Payer: Self-pay | Admitting: Orthopedic Surgery

## 2024-02-05 ENCOUNTER — Ambulatory Visit (INDEPENDENT_AMBULATORY_CARE_PROVIDER_SITE_OTHER): Admitting: Orthopedic Surgery

## 2024-02-05 VITALS — BP 133/66 | HR 73 | Ht 65.0 in | Wt 192.0 lb

## 2024-02-05 DIAGNOSIS — Z01818 Encounter for other preprocedural examination: Secondary | ICD-10-CM

## 2024-02-05 DIAGNOSIS — E559 Vitamin D deficiency, unspecified: Secondary | ICD-10-CM | POA: Diagnosis not present

## 2024-02-05 DIAGNOSIS — M79642 Pain in left hand: Secondary | ICD-10-CM | POA: Insufficient documentation

## 2024-02-05 DIAGNOSIS — E1169 Type 2 diabetes mellitus with other specified complication: Secondary | ICD-10-CM | POA: Diagnosis not present

## 2024-02-05 DIAGNOSIS — K2270 Barrett's esophagus without dysplasia: Secondary | ICD-10-CM | POA: Diagnosis not present

## 2024-02-05 DIAGNOSIS — Z8673 Personal history of transient ischemic attack (TIA), and cerebral infarction without residual deficits: Secondary | ICD-10-CM | POA: Diagnosis not present

## 2024-02-05 DIAGNOSIS — F4312 Post-traumatic stress disorder, chronic: Secondary | ICD-10-CM | POA: Insufficient documentation

## 2024-02-05 DIAGNOSIS — E039 Hypothyroidism, unspecified: Secondary | ICD-10-CM | POA: Diagnosis not present

## 2024-02-05 DIAGNOSIS — M65341 Trigger finger, right ring finger: Secondary | ICD-10-CM | POA: Diagnosis not present

## 2024-02-05 DIAGNOSIS — E782 Mixed hyperlipidemia: Secondary | ICD-10-CM | POA: Diagnosis not present

## 2024-02-05 DIAGNOSIS — R29898 Other symptoms and signs involving the musculoskeletal system: Secondary | ICD-10-CM | POA: Diagnosis not present

## 2024-02-05 DIAGNOSIS — I10 Essential (primary) hypertension: Secondary | ICD-10-CM | POA: Diagnosis not present

## 2024-02-05 NOTE — Progress Notes (Signed)
   There were no vitals taken for this visit.  There is no height or weight on file to calculate BMI.  Chief Complaint  Patient presents with   finger problem    Right ring finger triggering    Carpal Tunnel    Left     No diagnosis found.  DOI/DOS/ Date: ongoing  Worse here to discuss surgery on finger

## 2024-02-05 NOTE — Progress Notes (Signed)
 Patient ID: [name], male   DOB: 1946-04-18, 78 y.o.   MRN: [id number]  ASSESSMENT AND PLAN  78 year old male has a right ring finger that has been triggering and has failed nonoperative care which included 2 injections.  He would like to proceed with trigger finger release  The procedure has been fully reviewed with the patient; The risks and benefits of surgery have been discussed and explained and understood. Alternative treatment has also been reviewed, questions were encouraged and answered. The postoperative plan is also been reviewed.   Right ring finger trigger finger release of the A1 pulley    Chief Complaint  Patient presents with   finger problem    Right ring finger triggering    Carpal Tunnel    Left     HPI [name] is a 78 y.o. male.  Consult requested by Dr. Brenna  Right ring finger triggering had 2 injections did not help.  Patient complains of pain over the A1 pulley with frequent catching occasional locking requiring manual release    Review of Systems Review of Systems fever none chest pain none shortness of breath none left hand no longer bothering him  Past Medical History:  Diagnosis Date   Barrett esophagus    CAD (coronary artery disease)    Carotid body tumor (HCC)    Depression    Diabetes mellitus without complication (HCC)    Essential hypertension    GERD (gastroesophageal reflux disease)    Mixed hyperlipidemia    PTSD (post-traumatic stress disorder)     Past Surgical History:  Procedure Laterality Date   BIOPSY  09/18/2022   Procedure: BIOPSY;  Surgeon: Shaaron [name], MD;  Location: [facility];  Service: Endoscopy;;   CARPAL TUNNEL RELEASE Bilateral    ESOPHAGOGASTRODUODENOSCOPY (EGD) WITH PROPOFOL  N/A 09/18/2022   Procedure: ESOPHAGOGASTRODUODENOSCOPY (EGD) WITH PROPOFOL ;  Surgeon: Shaaron [name], MD;  Location: [facility];  Service: Endoscopy;  Laterality: N/A;  2:15 pm, asa 3   MALONEY DILATION N/A 09/18/2022    Procedure: MALONEY DILATION;  Surgeon: Shaaron [name], MD;  Location: [facility];  Service: Endoscopy;  Laterality: N/A;   ORIF ANKLE FRACTURE Left    ORTHOPEDIC SURGERY     left knee arthroplasty    Family History  Problem Relation Age of Onset   CVA Father        Cerebral hemorrhage    Social History Social History   Tobacco Use   Smoking status: Former    Current packs/day: 0.00    Average packs/day: 1 pack/day for 2.0 years (2.0 ttl pk-yrs)    Types: Cigarettes    Start date: 81    Quit date: 63    Years since quitting: 50.5   Smokeless tobacco: Never  Vaping Use   Vaping status: Never Used  Substance Use Topics   Alcohol use: Never   Drug use: Never    Allergies  Allergen Reactions   Simvastatin Rash    Other reaction(s): Muscle Pain, Other (See Comments)    Current Outpatient Medications  Medication Sig Dispense Refill   amLODipine  (NORVASC ) 5 MG tablet TAKE ONE TABLET BY MOUTH ONCE DAILY. 90 tablet 3   aspirin  EC 81 MG tablet Take 1 tablet (81 mg total) by mouth daily. 90 tablet 3   Cholecalciferol (VITAMIN D-1000 MAX ST) 25 MCG (1000 UT) tablet Take by mouth.     empagliflozin (JARDIANCE) 25 MG TABS tablet Take 12.5 mg by mouth.  lisinopril (ZESTRIL) 40 MG tablet Take 40 mg by mouth daily.     rosuvastatin  (CRESTOR ) 20 MG tablet Take 1 tablet by mouth daily.     No current facility-administered medications for this visit.     Physical Exam BP 133/66   Pulse 73   Ht 5' 5 (1.651 m)   Wt 192 lb (87.1 kg)   BMI 31.95 kg/m  Body mass index is 31.95 kg/m.  Appearance, there are no abnormalities in terms of appearance the patient was well-developed and well-nourished. The grooming and hygiene were normal.  Mental status orientation, there was normal alertness and orientation Mood pleasant Ambulatory status normal with no assistive devices  Examination of the right hand reveals tenderness over the A1 pulley of the ring finger Range of  motion remains normal with clicking on flexion extension.  Stability tests show no abnormality of the IP joints are MP joint. The FDP and FDS strength is normal Skin warm dry and intact without laceration or ulceration or erythema Neurologic examination normal sensation Vascular examination normal pulses with warm extremity and normal capillary refill     MEDICAL DECISION MAKING  A.  Encounter Diagnoses  Name Primary?   Trigger finger, right ring finger Yes   Pre-op evaluation     B. DATA ANALYSED:  IMAGING: Independent interpretation of images: no  Orders: surgery  Outside records reviewed: no, reviewed Dr. Janae records  C. MANAGEMENT surgical  No orders of the defined types were placed in this encounter.

## 2024-02-05 NOTE — Patient Instructions (Signed)
 Your surgery will be at [facility] by Dr Margrette on 02/17/24 The hospital will contact you with a preoperative appointment to discuss Anesthesia.  Please arrive on time or 15 minutes early for the preoperative appointment, they have a very tight schedule if you are late or do not come in your surgery will be cancelled.  The phone number is [phone]. Please bring your medications with you for the appointment. They will tell you the arrival time and medication instructions when you have your preoperative evaluation. Do not wear nail polish the day of your surgery and if you take Phentermine you need to stop this medication ONE WEEK prior to your surgery. If you take Invokana, Farxiga, Jardiance, or Steglatro) - Hold 72 hours before the procedure.  If you take Ozempic,  Mounjaro, Bydureon or Trulicity do not take for 8 days before your surgery. If you take Victoza, Rybelsis, Saxenda or Adlyxi stop 24 hours before the procedure.  Please arrive at the hospital 2 hours before procedure if scheduled at 9:30 or later in the day or at the time the nurse tells you at your preoperative visit.   If you have my chart do not use the time given in my chart use the time given to you by the nurse during your preoperative visit.   Your surgery  time may change. Please be available for phone calls the day of your surgery and the day before. The Short Stay department may need to discuss changes about your surgery time. Not reaching the you could lead to procedure delays and possible cancellation.  You must have a ride home and someone to stay with you for 24 to 48 hours. The person taking you home will receive and sign for the your discharge instructions.  Please be prepared to give your support person's name and telephone number to Central Registration. Dr Margrette will need that name and phone number post procedure.

## 2024-02-11 NOTE — Patient Instructions (Signed)
 [name]  02/11/2024     @PREFPERIOPPHARMACY @   Your procedure is scheduled on  02/17/2024.   Report to [facility] at  1150  A.M.   Call this number if you have problems the morning of surgery:  [phone]  If you experience any cold or flu symptoms such as cough, fever, chills, shortness of breath, etc. between now and your scheduled surgery, please notify us  at the above number.   Remember:          Your last dose of jardiance should be on 02/13/2024.         DO NOT take any medications for diabetes the morning of your procedure.   Do not eat after midnight.    You may drink clear liquids until 0950 am on 02/17/2024.    Clear liquids allowed are:                    Water, Juice (No red color; non-citric and without pulp; diabetics please choose diet or no sugar options), Carbonated beverages (diabetics please choose diet or no sugar options), Clear Tea (No creamer, milk, or cream, including half & half and powdered creamer), Black Coffee Only (No creamer, milk or cream, including half & half and powdered creamer), and Clear Sports drink (No red color; diabetics please choose diet or no sugar options)    Take these medicines the morning of surgery with A SIP OF WATER                                                    amlodipine .    Do not wear jewelry, make-up or nail polish, including gel polish,  artificial nails, or any other type of covering on natural nails (fingers and  toes).  Do not wear lotions, powders, or perfumes, or deodorant.  Do not shave 48 hours prior to surgery.  Men may shave face and neck.  Do not bring valuables to the hospital.  [facility] is not responsible for any belongings or valuables.  Contacts, dentures or bridgework may not be worn into surgery.  Leave your suitcase in the car.  After surgery it may be brought to your room.  For patients admitted to the hospital, discharge time will be determined by your treatment  team.  Patients discharged the day of surgery will not be allowed to drive home and must have someone with them for 24 hours.    Special instructions:   DO NOT smoke tobacco or vape for 24 hours before your procedure.  Please read over the following fact sheets that you were given. Coughing and Deep Breathing, Surgical Site Infection Prevention, Anesthesia Post-op Instructions, and Care and Recovery After Surgery      Trigger Finger Release, Care After After trigger finger release, it is common to have: Stiffness. Soreness. Swelling. Follow these instructions at home: Incision care  Keep the compression bandage on for 48 hours or as told by your health care provider. After removing it, follow instructions about how to take care of your incision. Make sure you: Wash your hands with soap and water for at least 20 seconds before and after you change your bandage (dressing). If soap and water are not available, use hand sanitizer. Change your dressing as told by your  provider. Leave stitches (sutures), skin glue, or tape strips in place. These skin closures may need to stay in place for 2 weeks or longer. If tape strip edges start to loosen and curl up, you may trim the loose edges. Do not remove tape strips completely unless your provider tells you to do that. Keep your hand and dressing clean and dry. Check your incision area every day for signs of infection. Check for: More redness, swelling, or pain. Fluid or blood. Warmth. Pus or a bad smell. Bathing Do not take baths, swim, or use a hot tub until your provider approves. Keep your dressing dry until your provider says it can be removed. Cover it with a watertight covering when you take a bath or a shower. Managing pain, stiffness, and swelling  If told, put ice on your palm. Put ice in a plastic bag. Place a towel between your skin and the bag. Leave the ice on for 20 minutes, 2-3 times a day. If your skin turns bright red,  remove the ice right away to prevent skin damage. The risk of damage is higher if you cannot feel pain, heat, or cold. Move your fingers often to reduce stiffness and swelling. Raise (elevate) your hand above the level of your heart while you are sitting or lying down. Activity If you were given a sedative during the procedure, it can affect you for several hours. Do not drive or operate machinery until your provider says that it is safe. You may have to avoid lifting. Ask your provider how much you can safely lift. Avoid any activity that causes pain. It may take 4-6 months for stiffness to go away. Return to your normal activities as told by your provider. Ask your provider what activities are safe for you. If hand therapy was prescribed, do exercises as told. This will help you regain movement. General instructions Take over-the-counter and prescription medicines only as told by your provider. Do not use any products that contain nicotine or tobacco. These products include cigarettes, chewing tobacco, and vaping devices, such as e-cigarettes. If you need help quitting, ask your provider. Keep all follow-up visits. If you have sutures, these will be removed in about 10-14 days. Your health care provider may give you more instructions. Make sure you know what you can and cannot do. Contact a health care provider if: You have chills or fever. You have any signs of infection. You are unable to move your finger because of pain or stiffness. You have any tingling or numbness in your hand or fingers. This information is not intended to replace advice given to you by your health care provider. Make sure you discuss any questions you have with your health care provider. Document Revised: 02/18/2022 Document Reviewed: 02/18/2022 Elsevier Patient Education  2024 Elsevier Inc.General Anesthesia, Adult, Care After The following information offers guidance on how to care for yourself after your  procedure. Your health care provider may also give you more specific instructions. If you have problems or questions, contact your health care provider. What can I expect after the procedure? After the procedure, it is common for people to: Have pain or discomfort at the IV site. Have nausea or vomiting. Have a sore throat or hoarseness. Have trouble concentrating. Feel cold or chills. Feel weak, sleepy, or tired (fatigue). Have soreness and body aches. These can affect parts of the body that were not involved in surgery. Follow these instructions at home: For the time period you were told by  your health care provider:  Rest. Do not participate in activities where you could fall or become injured. Do not drive or use machinery. Do not drink alcohol. Do not take sleeping pills or medicines that cause drowsiness. Do not make important decisions or sign legal documents. Do not take care of children on your own. General instructions Drink enough fluid to keep your urine pale yellow. If you have sleep apnea, surgery and certain medicines can increase your risk for breathing problems. Follow instructions from your health care provider about wearing your sleep device: Anytime you are sleeping, including during daytime naps. While taking prescription pain medicines, sleeping medicines, or medicines that make you drowsy. Return to your normal activities as told by your health care provider. Ask your health care provider what activities are safe for you. Take over-the-counter and prescription medicines only as told by your health care provider. Do not use any products that contain nicotine or tobacco. These products include cigarettes, chewing tobacco, and vaping devices, such as e-cigarettes. These can delay incision healing after surgery. If you need help quitting, ask your health care provider. Contact a health care provider if: You have nausea or vomiting that does not get better with  medicine. You vomit every time you eat or drink. You have pain that does not get better with medicine. You cannot urinate or have bloody urine. You develop a skin rash. You have a fever. Get help right away if: You have trouble breathing. You have chest pain. You vomit blood. These symptoms may be an emergency. Get help right away. Call 911. Do not wait to see if the symptoms will go away. Do not drive yourself to the hospital. Summary After the procedure, it is common to have a sore throat, hoarseness, nausea, vomiting, or to feel weak, sleepy, or fatigue. For the time period you were told by your health care provider, do not drive or use machinery. Get help right away if you have difficulty breathing, have chest pain, or vomit blood. These symptoms may be an emergency. This information is not intended to replace advice given to you by your health care provider. Make sure you discuss any questions you have with your health care provider. Document Revised: 10/04/2021 Document Reviewed: 10/04/2021 Elsevier Patient Education  2024 Elsevier Inc.How to Use Chlorhexidine at Home in the Shower Chlorhexidine gluconate (CHG) is a germ-killing (antiseptic) wash that's used to clean the skin. It can get rid of the germs that normally live on the skin and can keep them away for about 24 hours. If you're having surgery, you may be told to shower with CHG at home the night before surgery. This can help lower your risk for infection. To use CHG wash in the shower, follow the steps below. Supplies needed: CHG body wash. Clean washcloth. Clean towel. How to use CHG in the shower Follow these steps unless you're told to use CHG in a different way: Start the shower. Use your normal soap and shampoo to wash your face and hair. Turn off the shower or move out of the shower stream. Pour CHG onto a clean washcloth. Do not use any type of brush or rough sponge. Start at your neck, washing your body down  to your toes. Make sure you: Wash the part of your body where the surgery will be done for at least 1 minute. Do not scrub. Do not use CHG on your head or face unless your health care provider tells you to. If it gets  into your ears or eyes, rinse them well with water. Do not wash your genitals with CHG. Wash your back and under your arms. Make sure to wash skin folds. Let the CHG sit on your skin for 1-2 minutes or as long as told. Rinse your entire body in the shower, including all body creases and folds. Turn off the shower. Dry off with a clean towel. Do not put anything on your skin afterward, such as powder, lotion, or perfume. Put on clean clothes or pajamas. If it's the night before surgery, sleep in clean sheets. General tips Use CHG only as told, and follow the instructions on the label. Use the full amount of CHG as told. This is often one bottle. Do not smoke and stay away from flames after using CHG. Your skin may feel sticky after using CHG. This is normal. The sticky feeling will go away as the CHG dries. Do not use CHG: If you have a chlorhexidine allergy or have reacted to chlorhexidine in the past. On open wounds or areas of skin that have broken skin, cuts, or scrapes. On babies younger than 72 months of age. Contact a health care provider if: You have questions about using CHG. Your skin gets irritated or itchy. You have a rash after using CHG. You swallow any CHG. Call your local poison control center [phone] in the [place] Your eyes itch badly, or they become very red or swollen. Your hearing changes. You have trouble seeing. If you can't reach your provider, go to an urgent care or emergency room. Do not drive yourself. Get help right away if: You have swelling or tingling in your mouth or throat. You make high-pitched whistling sounds when you breathe, most often when you breathe out (wheeze). You have trouble breathing. These symptoms may be an  emergency. Call 911 right away. Do not wait to see if the symptoms will go away. Do not drive yourself to the hospital. This information is not intended to replace advice given to you by your health care provider. Make sure you discuss any questions you have with your health care provider. Document Revised: 01/20/2023 Document Reviewed: 01/16/2022 Elsevier Patient Education  2024 Elsevier Inc.How to Use an Incentive Spirometer An incentive spirometer is a tool that measures how well you are filling your lungs with each breath. Learning to take long, deep breaths using this tool can help you keep your lungs clear and active. This may help to reverse or lessen your chance of developing breathing (pulmonary) problems, especially infection. You may be asked to use a spirometer: After a surgery. If you have a lung problem or a history of smoking. After a long period of time when you have been unable to move or be active. If the spirometer includes an indicator to show the highest number that you have reached, your health care provider or respiratory therapist will help you set a goal. Keep a log of your progress as told by your health care provider. What are the risks? Breathing too quickly may cause dizziness or cause you to pass out. Take your time so you do not get dizzy or light-headed. If you are in pain, you may need to take pain medicine before doing incentive spirometry. It is harder to take a deep breath if you are having pain. How to use your incentive spirometer  Sit up on the edge of your bed or on a chair. Hold the incentive spirometer so that it is in an upright position.  Before you use the spirometer, breathe out normally. Place the mouthpiece in your mouth. Make sure your lips are closed tightly around it. Breathe in slowly and as deeply as you can through your mouth, causing the piston or the ball to rise toward the top of the chamber. Hold your breath for 3-5 seconds, or for as  long as possible. If the spirometer includes a coach indicator, use this to guide you in breathing. Slow down your breathing if the indicator goes above the marked areas. Remove the mouthpiece from your mouth and breathe out normally. The piston or ball will return to the bottom of the chamber. Rest for a few seconds, then repeat the steps 10 or more times. Take your time and take a few normal breaths between deep breaths so that you do not get dizzy or light-headed. Do this every 1-2 hours when you are awake. If the spirometer includes a goal marker to show the highest number you have reached (best effort), use this as a goal to work toward during each repetition. After each set of 10 deep breaths, cough a few times. This will help to make sure that your lungs are clear. If you have an incision on your chest or abdomen from surgery, place a pillow or a rolled-up towel firmly against the incision when you cough. This can help to reduce pain while taking deep breaths and coughing. General tips When you are able to get out of bed: Walk around often. Continue to take deep breaths and cough in order to clear your lungs. Keep using the incentive spirometer until your health care provider says it is okay to stop using it. If you have been in the hospital, you may be told to keep using the spirometer at home. Contact a health care provider if: You are having difficulty using the spirometer. You have trouble using the spirometer as often as instructed. Your pain medicine is not giving enough relief for you to use the spirometer as told. You have a fever. Get help right away if: You develop shortness of breath. You develop a cough with bloody mucus from the lungs. You have fluid or blood coming from an incision site after you cough. Summary An incentive spirometer is a tool that can help you learn to take long, deep breaths to keep your lungs clear and active. You may be asked to use a spirometer  after a surgery, if you have a lung problem or a history of smoking, or if you have been inactive for a long period of time. Use your incentive spirometer as instructed every 1-2 hours while you are awake. If you have an incision on your chest or abdomen, place a pillow or a rolled-up towel firmly against your incision when you cough. This will help to reduce pain. Get help right away if you have shortness of breath, you cough up bloody mucus, or blood comes from your incision when you cough. This information is not intended to replace advice given to you by your health care provider. Make sure you discuss any questions you have with your health care provider. Document Revised: 05/15/2023 Document Reviewed: 05/15/2023 Elsevier Patient Education  2024 [employer].

## 2024-02-13 ENCOUNTER — Encounter (HOSPITAL_COMMUNITY): Payer: Self-pay | Admitting: Anesthesiology

## 2024-02-15 ENCOUNTER — Encounter (HOSPITAL_COMMUNITY)
Admission: RE | Admit: 2024-02-15 | Discharge: 2024-02-15 | Disposition: A | Discharge status: Home / Self Care | Source: Ambulatory Visit | Attending: Orthopedic Surgery | Admitting: Orthopedic Surgery

## 2024-02-15 ENCOUNTER — Telehealth: Payer: Self-pay | Admitting: Orthopedic Surgery

## 2024-02-15 ENCOUNTER — Encounter (HOSPITAL_COMMUNITY): Payer: Self-pay

## 2024-02-15 DIAGNOSIS — E118 Type 2 diabetes mellitus with unspecified complications: Secondary | ICD-10-CM

## 2024-02-15 NOTE — Telephone Encounter (Signed)
 Dr. Areatha pt - pt presented to the office and stated that he wants to cx his surgery that is scheduled for Wednesday, bc he is trying to get the [place] stuff sorted out.  He stated that the [facility] didn't refer him here, therefore they aren't payiny for his visits/surgery.

## 2024-02-17 ENCOUNTER — Encounter (HOSPITAL_COMMUNITY): Admission: RE | Payer: Self-pay | Source: Home / Self Care

## 2024-02-17 ENCOUNTER — Encounter (HOSPITAL_COMMUNITY): Payer: Self-pay | Admitting: Anesthesiology

## 2024-02-17 ENCOUNTER — Ambulatory Visit (HOSPITAL_COMMUNITY): Admission: RE | Admit: 2024-02-17 | Source: Home / Self Care | Admitting: Orthopedic Surgery

## 2024-02-17 DIAGNOSIS — E118 Type 2 diabetes mellitus with unspecified complications: Secondary | ICD-10-CM

## 2024-02-17 SURGERY — RELEASE, A1 PULLEY, FOR TRIGGER FINGER
Anesthesia: Choice | Laterality: Right

## 2024-02-19 DIAGNOSIS — I10 Essential (primary) hypertension: Secondary | ICD-10-CM | POA: Diagnosis not present

## 2024-02-19 DIAGNOSIS — E782 Mixed hyperlipidemia: Secondary | ICD-10-CM | POA: Diagnosis not present

## 2024-02-19 DIAGNOSIS — E1169 Type 2 diabetes mellitus with other specified complication: Secondary | ICD-10-CM | POA: Diagnosis not present

## 2024-02-19 DIAGNOSIS — K219 Gastro-esophageal reflux disease without esophagitis: Secondary | ICD-10-CM | POA: Diagnosis not present

## 2024-03-03 ENCOUNTER — Encounter: Admitting: Orthopedic Surgery

## 2024-03-04 DIAGNOSIS — I10 Essential (primary) hypertension: Secondary | ICD-10-CM | POA: Diagnosis not present

## 2024-03-04 DIAGNOSIS — R29898 Other symptoms and signs involving the musculoskeletal system: Secondary | ICD-10-CM | POA: Diagnosis not present

## 2024-03-04 DIAGNOSIS — E559 Vitamin D deficiency, unspecified: Secondary | ICD-10-CM | POA: Diagnosis not present

## 2024-03-04 DIAGNOSIS — M79642 Pain in left hand: Secondary | ICD-10-CM | POA: Diagnosis not present

## 2024-03-04 DIAGNOSIS — K2270 Barrett's esophagus without dysplasia: Secondary | ICD-10-CM | POA: Diagnosis not present

## 2024-03-04 DIAGNOSIS — E039 Hypothyroidism, unspecified: Secondary | ICD-10-CM | POA: Diagnosis not present

## 2024-03-04 DIAGNOSIS — E782 Mixed hyperlipidemia: Secondary | ICD-10-CM | POA: Diagnosis not present

## 2024-03-04 DIAGNOSIS — E1169 Type 2 diabetes mellitus with other specified complication: Secondary | ICD-10-CM | POA: Diagnosis not present

## 2024-03-04 DIAGNOSIS — Z8673 Personal history of transient ischemic attack (TIA), and cerebral infarction without residual deficits: Secondary | ICD-10-CM | POA: Diagnosis not present

## 2024-03-21 DIAGNOSIS — E785 Hyperlipidemia, unspecified: Secondary | ICD-10-CM | POA: Diagnosis not present

## 2024-03-21 DIAGNOSIS — E1169 Type 2 diabetes mellitus with other specified complication: Secondary | ICD-10-CM | POA: Diagnosis not present

## 2024-03-21 DIAGNOSIS — I10 Essential (primary) hypertension: Secondary | ICD-10-CM | POA: Diagnosis not present

## 2024-03-31 ENCOUNTER — Encounter: Payer: Self-pay | Admitting: Internal Medicine

## 2024-04-15 DIAGNOSIS — M79642 Pain in left hand: Secondary | ICD-10-CM | POA: Diagnosis not present

## 2024-04-15 DIAGNOSIS — R29898 Other symptoms and signs involving the musculoskeletal system: Secondary | ICD-10-CM | POA: Diagnosis not present

## 2024-04-15 DIAGNOSIS — I10 Essential (primary) hypertension: Secondary | ICD-10-CM | POA: Diagnosis not present

## 2024-04-15 DIAGNOSIS — E039 Hypothyroidism, unspecified: Secondary | ICD-10-CM | POA: Diagnosis not present

## 2024-04-15 DIAGNOSIS — E1169 Type 2 diabetes mellitus with other specified complication: Secondary | ICD-10-CM | POA: Diagnosis not present

## 2024-04-15 DIAGNOSIS — K2270 Barrett's esophagus without dysplasia: Secondary | ICD-10-CM | POA: Diagnosis not present

## 2024-04-15 DIAGNOSIS — E782 Mixed hyperlipidemia: Secondary | ICD-10-CM | POA: Diagnosis not present

## 2024-04-15 DIAGNOSIS — E559 Vitamin D deficiency, unspecified: Secondary | ICD-10-CM | POA: Diagnosis not present

## 2024-04-15 DIAGNOSIS — Z8673 Personal history of transient ischemic attack (TIA), and cerebral infarction without residual deficits: Secondary | ICD-10-CM | POA: Diagnosis not present

## 2024-04-22 DIAGNOSIS — E782 Mixed hyperlipidemia: Secondary | ICD-10-CM | POA: Diagnosis not present

## 2024-04-22 DIAGNOSIS — E039 Hypothyroidism, unspecified: Secondary | ICD-10-CM | POA: Diagnosis not present

## 2024-04-22 DIAGNOSIS — E1169 Type 2 diabetes mellitus with other specified complication: Secondary | ICD-10-CM | POA: Diagnosis not present

## 2024-04-28 DIAGNOSIS — E782 Mixed hyperlipidemia: Secondary | ICD-10-CM | POA: Diagnosis not present

## 2024-04-28 DIAGNOSIS — D649 Anemia, unspecified: Secondary | ICD-10-CM | POA: Diagnosis not present

## 2024-04-28 DIAGNOSIS — E1169 Type 2 diabetes mellitus with other specified complication: Secondary | ICD-10-CM | POA: Diagnosis not present

## 2024-04-28 DIAGNOSIS — Z0001 Encounter for general adult medical examination with abnormal findings: Secondary | ICD-10-CM | POA: Diagnosis not present

## 2024-04-28 DIAGNOSIS — I10 Essential (primary) hypertension: Secondary | ICD-10-CM | POA: Diagnosis not present

## 2024-04-28 DIAGNOSIS — E039 Hypothyroidism, unspecified: Secondary | ICD-10-CM | POA: Diagnosis not present

## 2024-04-28 DIAGNOSIS — E1165 Type 2 diabetes mellitus with hyperglycemia: Secondary | ICD-10-CM | POA: Diagnosis not present

## 2024-04-28 DIAGNOSIS — K219 Gastro-esophageal reflux disease without esophagitis: Secondary | ICD-10-CM | POA: Diagnosis not present

## 2024-04-28 DIAGNOSIS — I700 Atherosclerosis of aorta: Secondary | ICD-10-CM | POA: Diagnosis not present

## 2024-04-28 DIAGNOSIS — Z0000 Encounter for general adult medical examination without abnormal findings: Secondary | ICD-10-CM | POA: Diagnosis not present

## 2024-06-20 ENCOUNTER — Encounter: Payer: Self-pay | Admitting: Orthopedic Surgery

## 2024-06-20 ENCOUNTER — Ambulatory Visit: Admitting: Orthopedic Surgery

## 2024-06-20 ENCOUNTER — Telehealth: Payer: Self-pay | Admitting: Orthopedic Surgery

## 2024-06-20 DIAGNOSIS — M65341 Trigger finger, right ring finger: Secondary | ICD-10-CM | POA: Diagnosis not present

## 2024-06-20 DIAGNOSIS — Z01818 Encounter for other preprocedural examination: Secondary | ICD-10-CM | POA: Diagnosis not present

## 2024-06-20 MED ORDER — METHYLPREDNISOLONE ACETATE 40 MG/ML IJ SUSP: 40.0000 mg | Freq: Once | INTRAMUSCULAR | Status: AC

## 2024-06-20 NOTE — Patient Instructions (Signed)
 Your surgery will be at [facility] by Dr Margrette  The hospital will contact you with a preoperative appointment to discuss Anesthesia.  Please arrive on time or 15 minutes early for the preoperative appointment, they have a very tight schedule if you are late or do not come in your surgery will be cancelled.  The phone number is [phone]. Please bring your medications with you for the appointment. They will tell you the arrival time and medication instructions when you have your preoperative evaluation. Do not wear nail polish the day of your surgery and if you take Phentermine you need to stop this medication ONE WEEK prior to your surgery. If you take Invokana, Farxiga, Jardiance, or Steglatro) - Hold 72 hours before the procedure.  If you take Ozempic,  Mounjaro, Bydureon or Trulicity do not take for 8 days before your surgery. If you take Victoza, Rybelsis, Saxenda or Adlyxi stop 24 hours before the procedure.  Please arrive at the hospital 2 hours before procedure if scheduled at 9:30 or later in the day or at the time the nurse tells you at your preoperative visit.   If you have my chart do not use the time given in my chart use the time given to you by the nurse during your preoperative visit.   Your surgery  time may change. Please be available for phone calls the day of your surgery and the day before. The Short Stay department may need to discuss changes about your surgery time. Not reaching the you could lead to procedure delays and possible cancellation.  You must have a ride home and someone to stay with you for 24 to 48 hours. The person taking you home will receive and sign for the your discharge instructions.  Please be prepared to give your support person's name and telephone number to [facility] [employer]. Dr Margrette will need that name and phone number post procedure.

## 2024-06-20 NOTE — Progress Notes (Signed)
    06/20/2024   Chief Complaint  Patient presents with   Hand Pain    Right ring finger/ injection or discuss surgery per patient     No diagnosis found.  What pharmacy do you use ? ___________________________  DOI/DOS/ Date: ongoing  Did you get better, worse or no change (Answer below)   Worse

## 2024-06-20 NOTE — Progress Notes (Signed)
 FOLLOW-UP OFFICE VISIT   Patient: [name]           Date of Birth: 01-Nov-1945           MRN: [id number] Visit Date: 06/20/2024 Requested by: Shona Norleen PEDLAR, MD [address] [place],  [place] [address] PCP: Edsel Devere SAUNDERS, PA-C    Encounter Diagnosis  Name Primary?   Acquired trigger finger of right ring finger Yes    Chief Complaint  Patient presents with   Hand Pain    Right ring finger/ injection or discuss surgery per patient       The patient has complaints of locking right ring finger has had multiple injections some in [place] some here  He has tenderness over the A1 pulley of the right ring finger no catching or locking today appears to have full range of motion good tendon function and normal sensation in the right hand    ASSESSMENT AND PLAN Patient ID: [name], male   DOB: 1946/05/30, 78 y.o.   MRN: [id number]   After discussing the options the patient is opted for trigger finger release of the right ring finger  Surgical arrangements will be made patient understands risks and benefits of procedure agrees to proceed.  Patient would like to be ready for flyfishing this spring

## 2024-06-20 NOTE — Addendum Note (Signed)
 Addended byBETHA JENEAN GREIG LELON on: 06/20/2024 03:47 PM   Modules accepted: Orders

## 2024-06-20 NOTE — Telephone Encounter (Signed)
 Are we filing VA coverage for his trigger finger? Or using the [facility] Medicare?   If we are using VA is there a current authorization on file?  He is scheduled for surgery, on Jan 13 dont want it to expire before surgery  If its [facility] medicare I will have to get approval through them

## 2024-06-21 ENCOUNTER — Ambulatory Visit: Admitting: Internal Medicine

## 2024-06-21 ENCOUNTER — Other Ambulatory Visit: Payer: Self-pay

## 2024-06-21 VITALS — BP 112/66 | HR 60 | Temp 98.9°F | Ht 65.0 in | Wt 191.0 lb

## 2024-06-21 DIAGNOSIS — K2270 Barrett's esophagus without dysplasia: Secondary | ICD-10-CM | POA: Diagnosis not present

## 2024-06-21 DIAGNOSIS — R194 Change in bowel habit: Secondary | ICD-10-CM

## 2024-06-21 DIAGNOSIS — K5909 Other constipation: Secondary | ICD-10-CM | POA: Diagnosis not present

## 2024-06-21 MED ORDER — LINACLOTIDE 72 MCG PO CAPS: 72.0000 ug | ORAL_CAPSULE | Freq: Every day | ORAL | 11 refills | Status: AC

## 2024-06-21 MED ORDER — PANTOPRAZOLE SODIUM 40 MG PO TBEC: 40.0000 mg | DELAYED_RELEASE_TABLET | Freq: Every day | ORAL | 11 refills | Status: AC

## 2024-06-21 NOTE — Progress Notes (Unsigned)
 Gastroenterology Progress Note    Primary Care Physician:  Russell Norleen PEDLAR, MD Primary Gastroenterologist:  Dr. Shaaron  Pre-Procedure History & Physical: HPI:  [name] is a 78 y.o. male here for here for follow-up GERD short segment Barrett's no dysplasia.  Taking Protonix  40 mg daily with excellent control of reflux symptoms no dysphagia.  Constipation now managed on Linzess  72 taken every day.  Overall doing well.  The patient is aged out of screening.  History of short segment Barrett's consider 1 more EGD 4 years of overall health remains acceptable.  Past Medical History:  Diagnosis Date   Barrett esophagus    CAD (coronary artery disease)    Carotid body tumor (HCC)    Depression    Diabetes mellitus without complication (HCC)    Essential hypertension    GERD (gastroesophageal reflux disease)    Mixed hyperlipidemia    PTSD (post-traumatic stress disorder)     Past Surgical History:  Procedure Laterality Date   BIOPSY  09/18/2022   Procedure: BIOPSY;  Surgeon: Russell [name], MD;  Location: [facility];  Service: Endoscopy;;   CARPAL TUNNEL RELEASE Bilateral    ESOPHAGOGASTRODUODENOSCOPY (EGD) WITH PROPOFOL  N/A 09/18/2022   Procedure: ESOPHAGOGASTRODUODENOSCOPY (EGD) WITH PROPOFOL ;  Surgeon: Russell [name], MD;  Location: [facility];  Service: Endoscopy;  Laterality: N/A;  2:15 pm, asa 3   MALONEY DILATION N/A 09/18/2022   Procedure: MALONEY DILATION;  Surgeon: Russell [name], MD;  Location: [facility];  Service: Endoscopy;  Laterality: N/A;   ORIF ANKLE FRACTURE Left    ORTHOPEDIC SURGERY     left knee arthroplasty    Prior to Admission medications   Medication Sig Start Date End Date Taking? Authorizing Provider  amLODipine  (NORVASC ) 5 MG tablet TAKE ONE TABLET BY MOUTH ONCE DAILY. 09/04/20  Yes Russell Jayson MATSU, MD  aspirin  EC 81 MG tablet Take 1 tablet (81 mg total) by mouth daily. 08/16/19  Yes Russell Jayson MATSU, MD  Cholecalciferol (VITAMIN  D-1000 MAX ST) 25 MCG (1000 UT) tablet Take 1,000 Units by mouth daily. 11/08/21  Yes [provider]  empagliflozin (JARDIANCE) 25 MG TABS tablet Take 12.5 mg by mouth in the morning and at bedtime. 12/01/23  Yes [provider]  lisinopril (ZESTRIL) 40 MG tablet Take 40 mg by mouth daily.   Yes [provider]  rosuvastatin  (CRESTOR ) 20 MG tablet Take 20 mg by mouth daily. 08/21/21  Yes [provider]  linaclotide  (LINZESS ) 72 MCG capsule Take 1 capsule (72 mcg total) by mouth daily before breakfast. 06/21/24   [name], [name], MD  pantoprazole  (PROTONIX ) 40 MG tablet Take 1 tablet (40 mg total) by mouth daily. 06/21/24   Russell [name], MD    Allergies as of 06/21/2024 - Review Complete 06/21/2024  Allergen Reaction Noted   Simvastatin Rash 10/08/2010    Family History  Problem Relation Age of Onset   CVA Father        Cerebral hemorrhage    Social History   Socioeconomic History   Marital status: Married    Spouse name: Not on file   Number of children: Not on file   Years of education: Not on file   Highest education level: Not on file  Occupational History   Not on file  Tobacco Use   Smoking status: Former    Current packs/day: 0.00    Average packs/day: 1 pack/day for 2.0 years (2.0 ttl pk-yrs)  Types: Cigarettes    Start date: 59    Quit date: 43    Years since quitting: 50.9   Smokeless tobacco: Never  Vaping Use   Vaping status: Never Used  Substance and Sexual Activity   Alcohol use: Never   Drug use: Never   Sexual activity: Not Currently  Other Topics Concern   Not on file  Social History Narrative   Right handed   Lives with wife in one story home   Social Drivers of Health   Financial Resource Strain: Low Risk  (12/18/2022)   Received from [facility]   Overall Financial Resource Strain (CARDIA)    Difficulty of Paying Living Expenses: Not hard at all  Food Insecurity: No Food Insecurity  (12/18/2022)   Received from [facility]   Hunger Vital Sign    Within the past 12 months, you worried that your food would run out before you got the money to buy more.: Never true    Within the past 12 months, the food you bought just didn't last and you didn't have money to get more.: Never true  Transportation Needs: No Transportation Needs (12/18/2022)   Received from [facility] - Transportation    In the past 12 months, has lack of transportation kept you from medical appointments or from getting medications?: No    Lack of Transportation (Non-Medical): No  Physical Activity: Not on file  Stress: Not on file  Social Connections: Not on file  Intimate Partner Violence: Not At Risk (04/22/2022)   Humiliation, Afraid, Rape, and Kick questionnaire    Fear of Current or Ex-Partner: No    Emotionally Abused: No    Physically Abused: No    Sexually Abused: No    Review of Systems   See HPI, otherwise negative ROS  Physical Exam: BP 112/66   Pulse 60   Temp 98.9 F (37.2 C)   Ht 5' 5 (1.651 m)   Wt 191 lb (86.6 kg)   BMI 31.78 kg/m  General:   Alert,  Well-developed, well-nourished, pleasant and cooperative in NAD Lungs:  Clear throughout to auscultation.   No wheezes, crackles, or rhonchi. No acute distress. Heart:  Regular rate and rhythm; no murmurs, clicks, rubs,  or gallops. Abdomen: Non-distended, normal bowel sounds.  Soft and nontender without appreciable mass or hepatosplenomegaly.  Pulses:  Normal pulses noted. Extremities:  Without clubbing or edema.   Impression/Plan:   #1 short segment Barrett's no dysplasia.  Reflux well-controlled on once daily PPI.  Consider 1 more EGD in 4 years if health acceptable  2.  Chronic constipation.  Doing much better on Linzess  72 every day.  Emphasized the need to take medication daily.  Office visit in 6 months   Notice: This dictation was prepared with Dragon dictation along  with smaller phrase technology. Any transcriptional errors that result from this process are unintentional and may not be corrected upon review.

## 2024-06-21 NOTE — Patient Instructions (Signed)
 Nice to see you again today  Because of the damage in your esophagus (Barrett's esophagus) you need to be on Protonix  40 mg every day.  Dispense 30 with 11 refills.  Take 1 daily before meal  You need better treatment of constipation.  Linzess 72 gelcap.  Take 1 daily to manage constipation dispense 30 with 11 refills.    Office visit with us  in 6 months.

## 2024-06-21 NOTE — Telephone Encounter (Signed)
 I saw it in side bar That's why I asked.

## 2024-06-24 ENCOUNTER — Emergency Department (HOSPITAL_COMMUNITY): Admission: EM | Admit: 2024-06-24 | Discharge: 2024-06-24 | Disposition: A | Discharge status: Home / Self Care

## 2024-06-24 ENCOUNTER — Encounter (HOSPITAL_COMMUNITY): Payer: Self-pay | Admitting: *Deleted

## 2024-06-24 ENCOUNTER — Other Ambulatory Visit: Payer: Self-pay

## 2024-06-24 ENCOUNTER — Emergency Department (HOSPITAL_COMMUNITY)

## 2024-06-24 DIAGNOSIS — R42 Dizziness and giddiness: Secondary | ICD-10-CM

## 2024-06-24 LAB — CBC
HCT: 41.7 % (ref 39.0–52.0)
Hemoglobin: 13.5 g/dL (ref 13.0–17.0)
MCH: 29.1 pg (ref 26.0–34.0)
MCHC: 32.4 g/dL (ref 30.0–36.0)
MCV: 89.9 fL (ref 80.0–100.0)
Platelets: 188 K/uL (ref 150–400)
RBC: 4.64 MIL/uL (ref 4.22–5.81)
RDW: 12.5 % (ref 11.5–15.5)
WBC: 4.3 K/uL (ref 4.0–10.5)
nRBC: 0 % (ref 0.0–0.2)

## 2024-06-24 LAB — COMPREHENSIVE METABOLIC PANEL WITH GFR
ALT: 17 U/L (ref 0–44)
AST: 20 U/L (ref 15–41)
Albumin: 4.1 g/dL (ref 3.5–5.0)
Alkaline Phosphatase: 86 U/L (ref 38–126)
Anion gap: 14 (ref 5–15)
BUN: 12 mg/dL (ref 8–23)
CO2: 20 mmol/L — ABNORMAL LOW (ref 22–32)
Calcium: 8.7 mg/dL — ABNORMAL LOW (ref 8.9–10.3)
Chloride: 108 mmol/L (ref 98–111)
Creatinine, Ser: 1.15 mg/dL (ref 0.61–1.24)
GFR, Estimated: >60 mL/min (ref >60)
Glucose, Bld: 172 mg/dL — ABNORMAL HIGH (ref 70–99)
Potassium: 3.9 mmol/L (ref 3.5–5.1)
Sodium: 142 mmol/L (ref 135–145)
Total Bilirubin: 0.3 mg/dL (ref 0.0–1.2)
Total Protein: 6.2 g/dL — ABNORMAL LOW (ref 6.5–8.1)

## 2024-06-24 LAB — URINALYSIS, ROUTINE W REFLEX MICROSCOPIC
Bacteria, UA: NONE SEEN
Bilirubin Urine: NEGATIVE
Glucose, UA: >=500 mg/dL — AB
Hgb urine dipstick: NEGATIVE
Ketones, ur: NEGATIVE mg/dL
Leukocytes,Ua: NEGATIVE
Nitrite: NEGATIVE
Protein, ur: NEGATIVE mg/dL
Specific Gravity, Urine: 1.024 (ref 1.005–1.030)
pH: 6 (ref 5.0–8.0)

## 2024-06-24 LAB — RESP PANEL BY RT-PCR (RSV, FLU A&B, COVID)  RVPGX2
Influenza A by PCR: NEGATIVE
Influenza B by PCR: NEGATIVE
Resp Syncytial Virus by PCR: NEGATIVE
SARS Coronavirus 2 by RT PCR: NEGATIVE

## 2024-06-24 LAB — CBG MONITORING, ED: Glucose-Capillary: 160 mg/dL — ABNORMAL HIGH (ref 70–99)

## 2024-06-24 LAB — TROPONIN T, HIGH SENSITIVITY
Troponin T High Sensitivity: <15 ng/L (ref 0–19)
Troponin T High Sensitivity: <15 ng/L (ref 0–19)

## 2024-06-24 MED ORDER — LACTATED RINGERS IV BOLUS
1000.0000 mL | Freq: Once | INTRAVENOUS | Status: AC
  Administered 2024-06-24: 1000 mL via INTRAVENOUS

## 2024-06-24 NOTE — ED Triage Notes (Signed)
 Pt states he woke up yesterday morning with the room spinning, occurred again this morning, pt c/o lightheadedness and generalized weakness. Denies any HA's, CP or SOB.

## 2024-06-24 NOTE — ED Provider Notes (Signed)
 [place] EMERGENCY DEPARTMENT AT [facility] Provider Note   CSN: [id number] Arrival date & time: 06/24/24  1400     Patient presents with: Dizziness   [name] is a 78 y.o. male.  Patient is here for evaluation of generalized weakness, lightheadedness, and shortness of breath that began yesterday morning.  Patient reports yesterday morning when he woke up he was feeling lightheaded and overall weak.  He was still able to go about his daily tasks including, a trip to [facility] driven by his brother-in-law for ongoing management of trigger finger.  This morning he woke up and continued to feel lightheaded, weak, and a bit short of breath so he decided to come in for further evaluation.  He denies any recent illness or fevers.  He denies recent N/V/D, blood in stool, or dark stool.  He denies dysuria though, he does report increased urinary frequency and dark-colored urine.  He has had UTI in the past however, he presented with headache at that time.  He denies recent falls or head injury.  He denies leg swelling, chest pain, palpitations, syncope, numbness or tingling.  The weakness is generalized and not specific to an extremity or activity.  He eats about 1 meal a day and a snack.  This is typical for him as over the last year he has lost his sense of smell and taste; he served in [place] and goes on to say that many of those who served alongside him have variance with losing their sense of smell and taste as well.  He is diabetic and states that he does not check his glucose at home as he is not sure how to use the device.  Denies recent change in medication.  On reevaluation patient is accompanied by his brother-in-law.  The history is provided by the patient.  Dizziness      Prior to Admission medications   Medication Sig Start Date End Date Taking? Authorizing Provider  amLODipine  (NORVASC ) 5 MG tablet TAKE ONE TABLET BY MOUTH ONCE DAILY. 09/04/20    Debera Jayson MATSU, MD  aspirin  EC 81 MG tablet Take 1 tablet (81 mg total) by mouth daily. 08/16/19   Debera Jayson MATSU, MD  Cholecalciferol (VITAMIN D-1000 MAX ST) 25 MCG (1000 UT) tablet Take 1,000 Units by mouth daily. 11/08/21   [provider]  empagliflozin (JARDIANCE) 25 MG TABS tablet Take 12.5 mg by mouth in the morning and at bedtime. 12/01/23   [provider]  linaclotide  (LINZESS ) 72 MCG capsule Take 1 capsule (72 mcg total) by mouth daily before breakfast. 06/21/24   Rourk, [name], MD  lisinopril (ZESTRIL) 40 MG tablet Take 40 mg by mouth daily.    [provider]  pantoprazole  (PROTONIX ) 40 MG tablet Take 1 tablet (40 mg total) by mouth daily. 06/21/24   Rourk, [name], MD  rosuvastatin  (CRESTOR ) 20 MG tablet Take 20 mg by mouth daily. 08/21/21   [provider]    Allergies: Simvastatin    Review of Systems  Neurological:  Positive for dizziness.    Orthostatic Vital Signs Orthostatic Lying BP- Lying: 123/55Pulse- Lying: 65 Orthostatic Sitting BP- Sitting: 135/61 (pt c/o dizziness)Pulse- Sitting: 65 Orthostatic Standing at 0 minutes BP- Standing at 0 minutes: 134/64Pulse- Standing at 0 minutes: 69 Orthostatic Standing at 3 minutes BP- Standing at 3 minutes: 137/73Pulse- Standing at 3 minutes: 72   Vitals:   06/24/24 1745 06/24/24 1800 06/24/24 1835 06/24/24 1841  BP: ROLLEN)  138/113   130/80  Pulse:  63  60  Resp: 15   18  Temp:   98 F (36.7 C) 98.1 F (36.7 C)  TempSrc:   Oral Oral  SpO2: 96%   99%  Weight:      Height:        Updated Vital Signs BP 130/80 (BP Location: Right Arm)   Pulse 60   Temp 98.1 F (36.7 C) (Oral)   Resp 18   Ht 5' 5 (1.651 m)   Wt 86.2 kg   SpO2 99%   BMI 31.62 kg/m   Physical Exam Vitals and nursing note reviewed.  Constitutional:      General: He is not in acute distress.    Appearance: Normal appearance. He is not ill-appearing or diaphoretic.  HENT:     Head: Normocephalic and  atraumatic.     Nose: Nose normal. No congestion or rhinorrhea.     Mouth/Throat:     Mouth: Mucous membranes are moist.  Eyes:     General: No scleral icterus.    Extraocular Movements: Extraocular movements intact.     Conjunctiva/sclera: Conjunctivae normal.     Pupils: Pupils are equal, round, and reactive to light.  Cardiovascular:     Rate and Rhythm: Normal rate and regular rhythm.     Pulses: Normal pulses.     Heart sounds: Normal heart sounds.  Pulmonary:     Effort: Pulmonary effort is normal. No respiratory distress.     Breath sounds: Normal breath sounds. No stridor. No wheezing, rhonchi or rales.  Abdominal:     General: Abdomen is flat. Bowel sounds are normal. There is no distension.     Palpations: Abdomen is soft.     Tenderness: There is no abdominal tenderness. There is no guarding.  Musculoskeletal:        General: No swelling, tenderness or deformity. Normal range of motion.     Cervical back: Normal range of motion. No tenderness.     Right lower leg: No edema.     Left lower leg: No edema.  Skin:    General: Skin is warm and dry.     Capillary Refill: Capillary refill takes less than 2 seconds.     Coloration: Skin is not jaundiced or pale.  Neurological:     Mental Status: He is alert and oriented to person, place, and time.     Sensory: No sensory deficit.     Motor: No weakness.     Coordination: Coordination normal.     Comments: Equal strength of bilateral upper and lower extremities.     (all labs ordered are listed, but only abnormal results are displayed) Labs Reviewed  COMPREHENSIVE METABOLIC PANEL WITH GFR - Abnormal; Notable for the following components:      Result Value   CO2 20 (*)    Glucose, Bld 172 (*)    Calcium  8.7 (*)    Total Protein 6.2 (*)    All other components within normal limits  URINALYSIS, ROUTINE W REFLEX MICROSCOPIC - Abnormal; Notable for the following components:   Glucose, UA >=500 (*)    All other components  within normal limits  CBG MONITORING, ED - Abnormal; Notable for the following components:   Glucose-Capillary 160 (*)    All other components within normal limits  RESP PANEL BY RT-PCR (RSV, FLU A&B, COVID)  [id number]  CBC  TROPONIN T, HIGH SENSITIVITY  TROPONIN T, HIGH SENSITIVITY    EKG: EKG  Interpretation Date/Time:  Friday June 24 2024 14:14:42 EST Ventricular Rate:  74 PR Interval:  219 QRS Duration:  98 QT Interval:  397 QTC Calculation: 441 R Axis:   35  Text Interpretation: Sinus rhythm Borderline prolonged PR interval Compared with prior EKG from 01/27/2024 Confirmed by Gennaro Bouchard ([id number]) on 06/24/2024 2:44:34 PM  Radiology: [facility] Chest Port 1 View Result Date: 06/24/2024 CLINICAL DATA:  shortness of breath EXAM: PORTABLE CHEST - 1 VIEW COMPARISON:  04/18/2022 FINDINGS: Lower lung volumes. Streaky left basilar atelectasis. No focal airspace consolidation, pleural effusion, or pneumothorax. No cardiomegaly. Tortuous aorta with aortic atherosclerosis. No acute fracture or destructive lesions. Multilevel thoracic osteophytosis. IMPRESSION: Lower lung volumes with streaky left basilar atelectasis. Otherwise, no acute cardiopulmonary abnormality. Electronically Signed   By: Rogelia Myers M.D.   On: 06/24/2024 15:31     Procedures   Medications Ordered in the ED  lactated ringers  bolus 1,000 mL (0 mLs Intravenous Stopped 06/24/24 1656)    Patient presents to the ED for concern of generalized weakness, shortness of breath, lightheadedness, this involves an extensive number of treatment options, and is a complaint that carries with it a high risk of complications and morbidity.  The differential diagnosis includes hypo-/hyperglycemia, MI, stroke, PE, UTI, pneumonia, URI, arrhythmia, hypovolemia, infectious process, medication induced.  Lab tests are reassuring regarding hypo-/hyperglycemia, infectious process, URI, or UTI. EKG and troponin lower suspicion for cardiac  etiology. Chest x-ray shows no evidence for pneumonia or pneumothorax. I do not suspect pulmonary embolism based on HPI and vital signs.  Co morbidities that complicate the patient evaluation  Type 2 diabetes History of CVA   Additional history obtained:  Additional history obtained from Outside Medical Records   External records from outside source obtained and reviewed [facility]   Lab Tests:  I Ordered, and personally interpreted labs.  The pertinent results include: Blood work is largely unremarkable.  Urinalysis not suspicious for UTI.  Serial troponin normal.  Respiratory panel negative.   Imaging Studies ordered:  I ordered imaging studies including chest x-ray I independently visualized and interpreted imaging which showed no acute cardiopulmonary findings I agree with the radiologist interpretation   Cardiac Monitoring:  The patient was maintained on a cardiac monitor.  I personally viewed and interpreted the cardiac monitored which showed an underlying rhythm of: Sinus rhythm with slight first-degree heart block and no evidence of STEMI. Current PR interval of 219; prior EKG on 01/27/2024 PR interval 202. No orthostatic changes.   Medicines ordered and prescription drug management:  I ordered medication including lactated ringer  bolus for generalized weakness. Reevaluation of the patient after these medicines showed that the patient improved I have reviewed the patients home medicines and have made adjustments as needed   Problem List / ED Course:  Generalized weakness, lightheadedness, intermittent shortness of breath.  Lab work, urinalysis, serial troponin, CBG, respiratory panel, and imaging do not point to a specific cause for the symptoms.  Given patient's HPI, symptoms may be related to dehydration and limited oral intake.  Patient was encouraged to begin having small frequent meals as much as he is able and increase  fluid intake as well as follow-up with primary care for ongoing evaluation and management. Diabetes management.  Patient was encouraged to take his diabetes supplies with him to his next primary care appointment so they can instruct him on their use to ensure he is able to properly manage his diabetes.  Brother-in-law states he will  be sure this happens.   Reevaluation:  After the interventions noted above, I reevaluated the patient and found that they have :improved   Dispostion:  After consideration of the diagnostic results and the patients response to treatment, I feel that the patent would benefit from continued supportive care in the home setting with close follow-up with primary care.  Return precautions given.   Medical Decision Making Amount and/or Complexity of Data Reviewed Labs: ordered. Radiology: ordered.   This note was produced using Electronics Engineer. While the provider has reviewed and verified all clinical information, transcription errors may remain.    Final diagnoses:  Lightheadedness    ED Discharge Orders     None          Rahmah Mccamy A, PA-C 06/24/24 2226    Gennaro Bouchard L, DO 06/28/24 1756

## 2024-06-24 NOTE — Discharge Instructions (Addendum)
 It was a pleasure meeting with you today.  As we discussed your imaging, EKG, and lab work were unremarkable for a specific cause for your recent symptoms.  It is reassuring that you felt better after being given fluids.  Please return for further evaluation if symptoms return or worsen, or any other concerning symptoms develop.  Follow-up with your primary care for ongoing care and management.

## 2024-07-07 ENCOUNTER — Other Ambulatory Visit: Payer: Self-pay | Admitting: Orthopedic Surgery

## 2024-07-07 DIAGNOSIS — M65341 Trigger finger, right ring finger: Secondary | ICD-10-CM

## 2024-07-19 DIAGNOSIS — R42 Dizziness and giddiness: Secondary | ICD-10-CM | POA: Insufficient documentation

## 2024-07-26 NOTE — Patient Instructions (Signed)
 "        [name]  07/26/2024     @PREFPERIOPPHARMACY @   Your procedure is scheduled on  08/02/2024.    Report to [name] at  0820 A.M.    Call this number if you have problems the morning of surgery:  [phone]  If you experience any cold or flu symptoms such as cough, fever, chills, shortness of breath, etc. between now and your scheduled surgery, please notify us  at the above number.   Remember:  Do not eat after midnight.    You may drink clear liquids until 0600 am on 08/02/2024.      Clear liquids allowed are:                    Water, Carbonated beverages (diabetics please choose diet or no sugar options), Black Coffee Only (No creamer, milk or cream, including half & half and powdered creamer), and Clear Sports drink (No red color; diabetics please choose diet or no sugar options)    Take these medicines the morning of surgery with A SIP OF WATER                                             pantoprazole .    Do not wear jewelry, make-up or nail polish, including gel polish,  artificial nails, or any other type of covering on natural nails (fingers and  toes).  Do not wear lotions, powders, or perfumes, or deodorant.  Do not shave 48 hours prior to surgery.  Men may shave face and neck.  Do not bring valuables to the hospital.  [facility] is not responsible for any belongings or valuables.  Contacts, dentures or bridgework may not be worn into surgery.  Leave your suitcase in the car.  After surgery it may be brought to your room.  For patients admitted to the hospital, discharge time will be determined by your treatment team.  Patients discharged the day of surgery will not be allowed to drive home and must have someone with them for 24 hours.    Special instructions:   DO NOT smoke tobacco or vape for 24 hours before your procedure.  Please read over the following fact sheets that you were given. Coughing and Deep Breathing, Surgical Site Infection  Prevention, Anesthesia Post-op Instructions, and Care and Recovery After Surgery        Trigger Finger Release, Care After After trigger finger release, it is common to have: Stiffness. Soreness. Swelling. Follow these instructions at home: Incision care  Keep the compression bandage on for 48 hours or as told by your health care provider. After removing it, follow instructions about how to take care of your incision. Make sure you: Wash your hands with soap and water for at least 20 seconds before and after you change your bandage (dressing). If soap and water are not available, use hand sanitizer. Change your dressing as told by your provider. Leave stitches (sutures), skin glue, or tape strips in place. These skin closures may need to stay in place for 2 weeks or longer. If tape strip edges start to loosen and curl up, you may trim the loose edges. Do not remove tape strips completely unless your provider tells you to do that. Keep your hand and dressing clean and dry. Check your incision area every day for signs of  infection. Check for: More redness, swelling, or pain. Fluid or blood. Warmth. Pus or a bad smell. Bathing Do not take baths, swim, or use a hot tub until your provider approves. Keep your dressing dry until your provider says it can be removed. Cover it with a watertight covering when you take a bath or a shower. Managing pain, stiffness, and swelling  If told, put ice on your palm. Put ice in a plastic bag. Place a towel between your skin and the bag. Leave the ice on for 20 minutes, 2-3 times a day. If your skin turns bright red, remove the ice right away to prevent skin damage. The risk of damage is higher if you cannot feel pain, heat, or cold. Move your fingers often to reduce stiffness and swelling. Raise (elevate) your hand above the level of your heart while you are sitting or lying down. Activity If you were given a sedative during the procedure, it can  affect you for several hours. Do not drive or operate machinery until your provider says that it is safe. You may have to avoid lifting. Ask your provider how much you can safely lift. Avoid any activity that causes pain. It may take 4-6 months for stiffness to go away. Return to your normal activities as told by your provider. Ask your provider what activities are safe for you. If hand therapy was prescribed, do exercises as told. This will help you regain movement. General instructions Take over-the-counter and prescription medicines only as told by your provider. Do not use any products that contain nicotine or tobacco. These products include cigarettes, chewing tobacco, and vaping devices, such as e-cigarettes. If you need help quitting, ask your provider. Keep all follow-up visits. If you have sutures, these will be removed in about 10-14 days. Your health care provider may give you more instructions. Make sure you know what you can and cannot do. Contact a health care provider if: You have chills or fever. You have any signs of infection. You are unable to move your finger because of pain or stiffness. You have any tingling or numbness in your hand or fingers. This information is not intended to replace advice given to you by your health care provider. Make sure you discuss any questions you have with your health care provider. Document Revised: 02/18/2022 Document Reviewed: 02/18/2022 Elsevier Patient Education  2024 [employer].General Anesthesia, Adult, Care After The following information offers guidance on how to care for yourself after your procedure. Your health care provider may also give you more specific instructions. If you have problems or questions, contact your health care provider. What can I expect after the procedure? After the procedure, it is common for people to: Have pain or discomfort at the IV site. Have nausea or vomiting. Have a sore throat or hoarseness. Have  trouble concentrating. Feel cold or chills. Feel weak, sleepy, or tired (fatigue). Have soreness and body aches. These can affect parts of the body that were not involved in surgery. Follow these instructions at home: For the time period you were told by your health care provider:  Rest. Do not participate in activities where you could fall or become injured. Do not drive or use machinery. Do not drink alcohol. Do not take sleeping pills or medicines that cause drowsiness. Do not make important decisions or sign legal documents. Do not take care of children on your own. General instructions Drink enough fluid to keep your urine pale yellow. If you have sleep apnea,  surgery and certain medicines can increase your risk for breathing problems. Follow instructions from your health care provider about wearing your sleep device: Anytime you are sleeping, including during daytime naps. While taking prescription pain medicines, sleeping medicines, or medicines that make you drowsy. Return to your normal activities as told by your health care provider. Ask your health care provider what activities are safe for you. Take over-the-counter and prescription medicines only as told by your health care provider. Do not use any products that contain nicotine or tobacco. These products include cigarettes, chewing tobacco, and vaping devices, such as e-cigarettes. These can delay incision healing after surgery. If you need help quitting, ask your health care provider. Contact a health care provider if: You have nausea or vomiting that does not get better with medicine. You vomit every time you eat or drink. You have pain that does not get better with medicine. You cannot urinate or have bloody urine. You develop a skin rash. You have a fever. Get help right away if: You have trouble breathing. You have chest pain. You vomit blood. These symptoms may be an emergency. Get help right away. Call 911. Do  not wait to see if the symptoms will go away. Do not drive yourself to the hospital. Summary After the procedure, it is common to have a sore throat, hoarseness, nausea, vomiting, or to feel weak, sleepy, or fatigue. For the time period you were told by your health care provider, do not drive or use machinery. Get help right away if you have difficulty breathing, have chest pain, or vomit blood. These symptoms may be an emergency. This information is not intended to replace advice given to you by your health care provider. Make sure you discuss any questions you have with your health care provider. Document Revised: 10/04/2021 Document Reviewed: 10/04/2021 Elsevier Patient Education  2024 Elsevier Inc.How to Use Chlorhexidine at Home in the Shower Chlorhexidine gluconate (CHG) is a germ-killing (antiseptic) wash that's used to clean the skin. It can get rid of the germs that normally live on the skin and can keep them away for about 24 hours. If you're having surgery, you may be told to shower with CHG at home the night before surgery. This can help lower your risk for infection. To use CHG wash in the shower, follow the steps below. Supplies needed: CHG body wash. Clean washcloth. Clean towel. How to use CHG in the shower Follow these steps unless you're told to use CHG in a different way: Start the shower. Use your normal soap and shampoo to wash your face and hair. Turn off the shower or move out of the shower stream. Pour CHG onto a clean washcloth. Do not use any type of brush or rough sponge. Start at your neck, washing your body down to your toes. Make sure you: Wash the part of your body where the surgery will be done for at least 1 minute. Do not scrub. Do not use CHG on your head or face unless your health care provider tells you to. If it gets into your ears or eyes, rinse them well with water. Do not wash your genitals with CHG. Wash your back and under your arms. Make sure to  wash skin folds. Let the CHG sit on your skin for 1-2 minutes or as long as told. Rinse your entire body in the shower, including all body creases and folds. Turn off the shower. Dry off with a clean towel. Do not put anything  on your skin afterward, such as powder, lotion, or perfume. Put on clean clothes or pajamas. If it's the night before surgery, sleep in clean sheets. General tips Use CHG only as told, and follow the instructions on the label. Use the full amount of CHG as told. This is often one bottle. Do not smoke and stay away from flames after using CHG. Your skin may feel sticky after using CHG. This is normal. The sticky feeling will go away as the CHG dries. Do not use CHG: If you have a chlorhexidine allergy or have reacted to chlorhexidine in the past. On open wounds or areas of skin that have broken skin, cuts, or scrapes. On babies younger than 75 months of age. Contact a health care provider if: You have questions about using CHG. Your skin gets irritated or itchy. You have a rash after using CHG. You swallow any CHG. Call your local poison control center [phone] in the [place] Your eyes itch badly, or they become very red or swollen. Your hearing changes. You have trouble seeing. If you can't reach your provider, go to an urgent care or emergency room. Do not drive yourself. Get help right away if: You have swelling or tingling in your mouth or throat. You make high-pitched whistling sounds when you breathe, most often when you breathe out (wheeze). You have trouble breathing. These symptoms may be an emergency. Call 911 right away. Do not wait to see if the symptoms will go away. Do not drive yourself to the hospital. This information is not intended to replace advice given to you by your health care provider. Make sure you discuss any questions you have with your health care provider. Document Revised: 01/20/2023 Document Reviewed: 01/16/2022 Elsevier  Patient Education  2024 Elsevier Inc.How to Use an Incentive Spirometer An incentive spirometer is a tool that measures how well you are filling your lungs with each breath. Learning to take long, deep breaths using this tool can help you keep your lungs clear and active. This may help to reverse or lessen your chance of developing breathing (pulmonary) problems, especially infection. You may be asked to use a spirometer: After a surgery. If you have a lung problem or a history of smoking. After a long period of time when you have been unable to move or be active. If the spirometer includes an indicator to show the highest number that you have reached, your health care provider or respiratory therapist will help you set a goal. Keep a log of your progress as told by your health care provider. What are the risks? Breathing too quickly may cause dizziness or cause you to pass out. Take your time so you do not get dizzy or light-headed. If you are in pain, you may need to take pain medicine before doing incentive spirometry. It is harder to take a deep breath if you are having pain. How to use your incentive spirometer  Sit up on the edge of your bed or on a chair. Hold the incentive spirometer so that it is in an upright position. Before you use the spirometer, breathe out normally. Place the mouthpiece in your mouth. Make sure your lips are closed tightly around it. Breathe in slowly and as deeply as you can through your mouth, causing the piston or the ball to rise toward the top of the chamber. Hold your breath for 3-5 seconds, or for as long as possible. If the spirometer includes a coach indicator, use this to guide  you in breathing. Slow down your breathing if the indicator goes above the marked areas. Remove the mouthpiece from your mouth and breathe out normally. The piston or ball will return to the bottom of the chamber. Rest for a few seconds, then repeat the steps 10 or more  times. Take your time and take a few normal breaths between deep breaths so that you do not get dizzy or light-headed. Do this every 1-2 hours when you are awake. If the spirometer includes a goal marker to show the highest number you have reached (best effort), use this as a goal to work toward during each repetition. After each set of 10 deep breaths, cough a few times. This will help to make sure that your lungs are clear. If you have an incision on your chest or abdomen from surgery, place a pillow or a rolled-up towel firmly against the incision when you cough. This can help to reduce pain while taking deep breaths and coughing. General tips When you are able to get out of bed: Walk around often. Continue to take deep breaths and cough in order to clear your lungs. Keep using the incentive spirometer until your health care provider says it is okay to stop using it. If you have been in the hospital, you may be told to keep using the spirometer at home. Contact a health care provider if: You are having difficulty using the spirometer. You have trouble using the spirometer as often as instructed. Your pain medicine is not giving enough relief for you to use the spirometer as told. You have a fever. Get help right away if: You develop shortness of breath. You develop a cough with bloody mucus from the lungs. You have fluid or blood coming from an incision site after you cough. Summary An incentive spirometer is a tool that can help you learn to take long, deep breaths to keep your lungs clear and active. You may be asked to use a spirometer after a surgery, if you have a lung problem or a history of smoking, or if you have been inactive for a long period of time. Use your incentive spirometer as instructed every 1-2 hours while you are awake. If you have an incision on your chest or abdomen, place a pillow or a rolled-up towel firmly against your incision when you cough. This will help to  reduce pain. Get help right away if you have shortness of breath, you cough up bloody mucus, or blood comes from your incision when you cough. This information is not intended to replace advice given to you by your health care provider. Make sure you discuss any questions you have with your health care provider. Document Revised: 05/15/2023 Document Reviewed: 05/15/2023 Elsevier Patient Education  2024 [employer]. "

## 2024-07-28 ENCOUNTER — Telehealth: Payer: Self-pay | Admitting: Radiology

## 2024-07-28 ENCOUNTER — Encounter (HOSPITAL_COMMUNITY): Payer: Self-pay

## 2024-07-28 ENCOUNTER — Encounter (HOSPITAL_COMMUNITY)
Admission: RE | Admit: 2024-07-28 | Discharge: 2024-07-28 | Disposition: A | Discharge status: Home / Self Care | Source: Ambulatory Visit | Attending: Orthopedic Surgery | Admitting: Orthopedic Surgery

## 2024-07-28 VITALS — BP 132/57 | HR 68 | Resp 18 | Ht 65.0 in | Wt 190.0 lb

## 2024-07-28 DIAGNOSIS — Z01818 Encounter for other preprocedural examination: Secondary | ICD-10-CM | POA: Diagnosis present

## 2024-07-28 DIAGNOSIS — Z01812 Encounter for preprocedural laboratory examination: Secondary | ICD-10-CM | POA: Insufficient documentation

## 2024-07-28 DIAGNOSIS — E118 Type 2 diabetes mellitus with unspecified complications: Secondary | ICD-10-CM | POA: Diagnosis not present

## 2024-07-28 LAB — HEMOGLOBIN A1C
Hgb A1c MFr Bld: 7.6 % — ABNORMAL HIGH (ref 4.8–5.6)
Mean Plasma Glucose: 171.42 mg/dL

## 2024-07-28 NOTE — Telephone Encounter (Signed)
 I called patient he said he did see Dr Shona who told him it was from dehydration and he felt better after he received fluids in the emergency room

## 2024-07-28 NOTE — H&P (Signed)
 [name] is an 79 y.o. male.   Chief Complaint: Catching pain right ring finger HPI: 79 year old male avid fly Fisher presented for catching locking and pain over the right ring finger at the A1 pulley he had 2 injections and is now opted for surgical release of his right ring finger  Past Medical History:  Diagnosis Date   Barrett esophagus    CAD (coronary artery disease)    Carotid body tumor (HCC)    Depression    Diabetes mellitus without complication (HCC)    Essential hypertension    GERD (gastroesophageal reflux disease)    Mixed hyperlipidemia    PTSD (post-traumatic stress disorder)     Past Surgical History:  Procedure Laterality Date   BIOPSY  09/18/2022   Procedure: BIOPSY;  Surgeon: Shaaron [name], MD;  Location: [facility];  Service: Endoscopy;;   CARPAL TUNNEL RELEASE Bilateral    ESOPHAGOGASTRODUODENOSCOPY (EGD) WITH PROPOFOL  N/A 09/18/2022   Procedure: ESOPHAGOGASTRODUODENOSCOPY (EGD) WITH PROPOFOL ;  Surgeon: Shaaron [name], MD;  Location: [facility];  Service: Endoscopy;  Laterality: N/A;  2:15 pm, asa 3   MALONEY DILATION N/A 09/18/2022   Procedure: MALONEY DILATION;  Surgeon: Shaaron [name], MD;  Location: [facility];  Service: Endoscopy;  Laterality: N/A;   ORIF ANKLE FRACTURE Left    ORTHOPEDIC SURGERY     left knee arthroplasty    Family History  Problem Relation Age of Onset   CVA Father        Cerebral hemorrhage   Social History:  reports that he quit smoking about 51 years ago. His smoking use included cigarettes. He started smoking about 53 years ago. He has a 2 pack-year smoking history. He has never used smokeless tobacco. He reports that he does not drink alcohol and does not use drugs.  Allergies: Allergies[1]  No medications prior to admission.    No results found for this or any previous visit (from the past 48 hours). No results found.  Review of Systems recent ER visit on December 5 for weakness and  lightheadedness  There were no vitals taken for this visit. Physical Exam   General appearance normal development grooming and hygiene  Oriented x 3  Mood pleasant affect normal  Gait unremarkable  Inspection of the right ring finger shows tenderness over the A1 pulley with full range of motion and a catching sensation noted no instability of the joints of the hand grip strength may show some weakness related to arthritis skin is intact pulses are normal temperature of the digit is normal there is no swelling  No lymphadenopathy in the extremities sensation seems to be intact there are no pathologic reflexes in hand coordination is normal   Assessment/Plan Right ring finger tenosynovitis and trigger phenomenon  Release A1 pulley right ring finger  [name], MD 07/28/2024, 8:55 AM       [1]  Allergies Allergen Reactions   Simvastatin Rash    Other reaction(s): Muscle Pain, Other (See Comments)

## 2024-07-28 NOTE — Telephone Encounter (Signed)
-----   Message from [name], MD sent at 07/28/2024  8:54 AM EST ----- [name], this patient was in the emergency room on December 5 lightheaded and short of breath feeling weak  He was advised that he see Dr. Shona in 3 days can you ask him how he is feeling and if he actually saw Dr. Shona after the ER visit

## 2024-08-02 ENCOUNTER — Encounter (HOSPITAL_COMMUNITY): Admission: RE | Disposition: A | Payer: Self-pay | Source: Home / Self Care | Attending: Orthopedic Surgery

## 2024-08-02 ENCOUNTER — Encounter (HOSPITAL_COMMUNITY): Admitting: Anesthesiology

## 2024-08-02 ENCOUNTER — Ambulatory Visit (HOSPITAL_COMMUNITY): Admitting: Anesthesiology

## 2024-08-02 ENCOUNTER — Encounter (HOSPITAL_COMMUNITY): Payer: Self-pay | Admitting: Orthopedic Surgery

## 2024-08-02 ENCOUNTER — Ambulatory Visit (HOSPITAL_COMMUNITY)
Admission: RE | Admit: 2024-08-02 | Discharge: 2024-08-02 | Disposition: A | Discharge status: Home / Self Care | Attending: Orthopedic Surgery | Admitting: Orthopedic Surgery

## 2024-08-02 DIAGNOSIS — K219 Gastro-esophageal reflux disease without esophagitis: Secondary | ICD-10-CM | POA: Diagnosis not present

## 2024-08-02 DIAGNOSIS — I2510 Atherosclerotic heart disease of native coronary artery without angina pectoris: Secondary | ICD-10-CM | POA: Diagnosis not present

## 2024-08-02 DIAGNOSIS — M65341 Trigger finger, right ring finger: Secondary | ICD-10-CM | POA: Diagnosis present

## 2024-08-02 DIAGNOSIS — I10 Essential (primary) hypertension: Secondary | ICD-10-CM | POA: Diagnosis not present

## 2024-08-02 DIAGNOSIS — Z87891 Personal history of nicotine dependence: Secondary | ICD-10-CM | POA: Insufficient documentation

## 2024-08-02 DIAGNOSIS — F419 Anxiety disorder, unspecified: Secondary | ICD-10-CM | POA: Diagnosis not present

## 2024-08-02 DIAGNOSIS — F32A Depression, unspecified: Secondary | ICD-10-CM | POA: Insufficient documentation

## 2024-08-02 HISTORY — PX: TRIGGER FINGER RELEASE: SHX641

## 2024-08-02 LAB — GLUCOSE, CAPILLARY: Glucose-Capillary: 132 mg/dL — ABNORMAL HIGH (ref 70–99)

## 2024-08-02 MED ORDER — PROPOFOL 10 MG/ML IV BOLUS
INTRAVENOUS | Status: AC
  Filled 2024-08-02: qty 20

## 2024-08-02 MED ORDER — ORAL CARE MOUTH RINSE: 15.0000 mL | Freq: Once | OROMUCOSAL | Status: AC

## 2024-08-02 MED ORDER — HYDROCODONE-ACETAMINOPHEN 5-325 MG PO TABS: 1.0000 | ORAL_TABLET | Freq: Four times a day (QID) | ORAL | 0 refills | Status: AC | PRN

## 2024-08-02 MED ORDER — BUPIVACAINE HCL (PF) 0.5 % IJ SOLN
INTRAMUSCULAR | Status: DC | PRN
  Administered 2024-08-02: 10 mL

## 2024-08-02 MED ORDER — LIDOCAINE HCL (PF) 1 % IJ SOLN
INTRAMUSCULAR | Status: AC
  Filled 2024-08-02: qty 30

## 2024-08-02 MED ORDER — SODIUM CHLORIDE 0.9 % IR SOLN
Status: DC | PRN
  Administered 2024-08-02: 1000 mL

## 2024-08-02 MED ORDER — ONDANSETRON HCL 4 MG/2ML IJ SOLN
INTRAMUSCULAR | Status: AC
  Filled 2024-08-02: qty 2

## 2024-08-02 MED ORDER — FENTANYL CITRATE (PF) 100 MCG/2ML IJ SOLN
INTRAMUSCULAR | Status: DC | PRN
  Administered 2024-08-02 (×4): 25 ug via INTRAVENOUS

## 2024-08-02 MED ORDER — CEFAZOLIN SODIUM-DEXTROSE 2-4 GM/100ML-% IV SOLN
2.0000 g | INTRAVENOUS | Status: AC
  Administered 2024-08-02: 2 g via INTRAVENOUS
  Filled 2024-08-02: qty 100

## 2024-08-02 MED ORDER — CHLORHEXIDINE GLUCONATE 0.12 % MT SOLN
15.0000 mL | Freq: Once | OROMUCOSAL | Status: AC
  Administered 2024-08-02: 15 mL via OROMUCOSAL
  Filled 2024-08-02: qty 15

## 2024-08-02 MED ORDER — ACETAMINOPHEN 500 MG PO TABS: 500.0000 mg | ORAL_TABLET | Freq: Four times a day (QID) | ORAL | 0 refills | Status: AC | PRN

## 2024-08-02 MED ORDER — VASOPRESSIN 20 UNIT/ML IV SOLN
INTRAVENOUS | Status: AC
  Filled 2024-08-02: qty 1

## 2024-08-02 MED ORDER — ONDANSETRON HCL 4 MG/2ML IJ SOLN
INTRAMUSCULAR | Status: DC | PRN
  Administered 2024-08-02: 4 mg via INTRAVENOUS

## 2024-08-02 MED ORDER — LACTATED RINGERS IV SOLN: INTRAVENOUS | Status: DC

## 2024-08-02 MED ORDER — SODIUM CHLORIDE (PF) 0.9 % IJ SOLN
INTRAMUSCULAR | Status: AC
  Filled 2024-08-02: qty 20

## 2024-08-02 MED ORDER — FENTANYL CITRATE (PF) 100 MCG/2ML IJ SOLN
INTRAMUSCULAR | Status: AC
  Filled 2024-08-02: qty 2

## 2024-08-02 MED ORDER — BUPIVACAINE HCL (PF) 0.5 % IJ SOLN
INTRAMUSCULAR | Status: AC
  Filled 2024-08-02: qty 30

## 2024-08-02 MED ORDER — LIDOCAINE HCL (PF) 1 % IJ SOLN
INTRAMUSCULAR | Status: DC | PRN
  Administered 2024-08-02: 10 mL

## 2024-08-02 MED ORDER — PROPOFOL 10 MG/ML IV BOLUS
INTRAVENOUS | Status: DC | PRN
  Administered 2024-08-02: 75 ug/kg/min via INTRAVENOUS
  Administered 2024-08-02: 80 mg via INTRAVENOUS

## 2024-08-02 NOTE — Transfer of Care (Signed)
 Immediate Anesthesia Transfer of Care Note  Patient: [name]  Procedure(s) Performed: RELEASE, A1 PULLEY, FOR TRIGGER FINGER (Right: Hand)  Patient Location: Short Stay  Anesthesia Type:MAC  Level of Consciousness: awake, alert , oriented, and patient cooperative  Airway & Oxygen Therapy: Patient Spontanous Breathing  Post-op Assessment: Report given to RN and Post -op Vital signs reviewed and stable  Post vital signs: Reviewed and stable  Last Vitals:  Vitals Value Taken Time  BP 101/51 08/02/24 11:00  Temp 36.6 C 08/02/24 11:00  Pulse 62 08/02/24 11:00  Resp 17 08/02/24 11:00  SpO2 98 % 08/02/24 11:00    Last Pain:  Vitals:   08/02/24 1100  TempSrc: Oral  PainSc: 0-No pain      Patients Stated Pain Goal: 6 (08/02/24 1100)  Complications: No notable events documented.

## 2024-08-02 NOTE — Interval H&P Note (Signed)
 History and Physical Interval Note:  08/02/2024 10:05 AM  [name]  has presented today for surgery, with the diagnosis of trigger finger right ring finger.  The various methods of treatment have been discussed with the patient and family. After consideration of risks, benefits and other options for treatment, the patient has consented to  Procedures: RELEASE, A1 PULLEY, FOR TRIGGER FINGER (Right) RING FINGER as a surgical intervention.  The patient's history has been reviewed, patient examined, no change in status, stable for surgery.  I have reviewed the patient's chart and labs.  Questions were answered to the patient's satisfaction.     [name]

## 2024-08-02 NOTE — Op Note (Signed)
 08/02/2024 10:17 AM  10:57 AM  PATIENT:  [name]  79 y.o. male  PRE-OPERATIVE DIAGNOSIS:  trigger finger right ring finger  POST-OPERATIVE DIAGNOSIS:  trigger finger right ring finger  PROCEDURE:  Procedures: RELEASE, A1 PULLEY, FOR TRIGGER FINGER (Right)   FINDINGS:  - Stenosis tenosynovitis of the right ring finger   Details of procedure  79 year old male stenosing tenosynovitis right ring finger patient opted for surgical intervention  The patient was seen in the preop area and his identity was confirmed.  The surgical site was confirmed and marked right ring finger.  Chart review was completed  The patient was taken to the operating room placed on the operating table supine he had generalized anesthesia via MAC plus local lidocaine  to be administered later  The right arm was prepared by placing a tourniquet proximally.  It was then prepped and draped sterilely.  Timeout was completed.  10 cc of lidocaine  was injected at the incision site.  The limb was then exsanguinated with the Fornage Esmarch the tourniquet was elevated to 250 mmHg  Tourniquet time 18 minutes  The incision was marked out over the A1 pulley of the right ring finger.  A transverse incision was made.  The subcutaneous tissue was divided.  The tendon was identified including the proximal aspect of the A1 pulley.  The neurovascular bundle was protected on each side of the tendon.  The tendon was divided sharply with a knife and then a scissors was used to extend the A1 pulley release distally.  The tendon was checked for excursion there was no locking or catching.  The wound was irrigated and closed with 3-0 nylon suture x 5  Marcaine  was then injected 10 cc around the wound for postop anesthesia  SURGEON:  Surgeons and Role:    * Margrette Taft BRAVO, MD - Primary  PHYSICIAN ASSISTANT:   ASSISTANTS: none   ANESTHESIA:   local and MAC  EBL:  none   BLOOD ADMINISTERED:none  DRAINS: none    LOCAL MEDICATIONS USED:  MARCAINE     and LIDOCAINE    SPECIMEN:  No Specimen  DISPOSITION OF SPECIMEN:  N/A  COUNTS:  YES  TOURNIQUET:   Total Tourniquet Time Documented: Upper Arm (Right) - 17 minutes Total: Upper Arm (Right) - 17 minutes   DICTATION: .Nechama Dictation  PLAN OF CARE: Discharge to home after PACU  PATIENT DISPOSITION:  PACU - hemodynamically stable.   Delay start of Pharmacological VTE agent (>24hrs) due to surgical blood loss or risk of bleeding: not applicable   Postop plan  Dressing can be removed tomorrow  Immediate flexion extension can be started today  Follow-up in 2 weeks and 1 day for suture removal

## 2024-08-02 NOTE — Anesthesia Preprocedure Evaluation (Signed)
"                                    Anesthesia Evaluation  Patient identified by MRN, date of birth, ID band Patient awake    Reviewed: Allergy & Precautions, H&P , NPO status , Patient's Chart, lab work & pertinent test results, reviewed documented beta blocker date and time   Airway Mallampati: II  TM Distance: >3 FB Neck ROM: full    Dental no notable dental hx.    Pulmonary neg pulmonary ROS, former smoker   Pulmonary exam normal breath sounds clear to auscultation       Cardiovascular Exercise Tolerance: Good hypertension, + angina  + CAD  negative cardio ROS  Rhythm:regular Rate:Normal     Neuro/Psych  PSYCHIATRIC DISORDERS Anxiety Depression    negative neurological ROS  negative psych ROS   GI/Hepatic negative GI ROS, Neg liver ROS,GERD  ,,  Endo/Other  negative endocrine ROSdiabetes    Renal/GU negative Renal ROS  negative genitourinary   Musculoskeletal   Abdominal   Peds  Hematology negative hematology ROS (+)   Anesthesia Other Findings   Reproductive/Obstetrics negative OB ROS                              Anesthesia Physical Anesthesia Plan  ASA: 2  Anesthesia Plan: General and General LMA   Post-op Pain Management:    Induction:   PONV Risk Score and Plan: Ondansetron   Airway Management Planned:   Additional Equipment:   Intra-op Plan:   Post-operative Plan:   Informed Consent: I have reviewed the patients History and Physical, chart, labs and discussed the procedure including the risks, benefits and alternatives for the proposed anesthesia with the patient or authorized representative who has indicated his/her understanding and acceptance.     Dental Advisory Given  Plan Discussed with: CRNA  Anesthesia Plan Comments:         Anesthesia Quick Evaluation  "

## 2024-08-02 NOTE — Brief Op Note (Signed)
 08/02/2024 10:17 AM  10:57 AM  PATIENT:  [name]  79 y.o. male  PRE-OPERATIVE DIAGNOSIS:  trigger finger right ring finger  POST-OPERATIVE DIAGNOSIS:  trigger finger right ring finger  PROCEDURE:  Procedures: RELEASE, A1 PULLEY, FOR TRIGGER FINGER (Right)  SURGEON:  Surgeons and Role:    DEWAINE Margrette Taft FORBES, MD - Primary  PHYSICIAN ASSISTANT:   ASSISTANTS: none   ANESTHESIA:   local and MAC  EBL:  none   BLOOD ADMINISTERED:none  DRAINS: none   LOCAL MEDICATIONS USED:  MARCAINE     and LIDOCAINE    SPECIMEN:  No Specimen  DISPOSITION OF SPECIMEN:  N/A  COUNTS:  YES  TOURNIQUET:   Total Tourniquet Time Documented: Upper Arm (Right) - 17 minutes Total: Upper Arm (Right) - 17 minutes   DICTATION: .Nechama Dictation  PLAN OF CARE: Discharge to home after PACU  PATIENT DISPOSITION:  PACU - hemodynamically stable.   Delay start of Pharmacological VTE agent (>24hrs) due to surgical blood loss or risk of bleeding: not applicable

## 2024-08-03 ENCOUNTER — Encounter (HOSPITAL_COMMUNITY): Payer: Self-pay | Admitting: Orthopedic Surgery

## 2024-08-04 ENCOUNTER — Ambulatory Visit: Admitting: Radiology

## 2024-08-04 ENCOUNTER — Telehealth: Payer: Self-pay | Admitting: Orthopedic Surgery

## 2024-08-04 ENCOUNTER — Encounter: Payer: Self-pay | Admitting: Radiology

## 2024-08-04 DIAGNOSIS — M65341 Trigger finger, right ring finger: Secondary | ICD-10-CM

## 2024-08-04 NOTE — Telephone Encounter (Signed)
 Dr. Areatha pt - pt lvm stating he had surgery 08/02/24 and that his paper from the hospital says he needs a bandage change in two days.  He stated that he lives alone and it's kind of hard to get dressed and get here.  He has a postop appt 08/17/24 per Dr. Margery staff message.  Please advise.  [phone]

## 2024-08-04 NOTE — Anesthesia Postprocedure Evaluation (Signed)
"   Anesthesia Post Note  Patient: Fairy JAYSON Rouleau  Procedure(s) Performed: RELEASE, A1 PULLEY, FOR TRIGGER FINGER (Right: Hand)  Patient location during evaluation: Phase II Anesthesia Type: General Level of consciousness: awake Pain management: pain level controlled Vital Signs Assessment: post-procedure vital signs reviewed and stable Respiratory status: spontaneous breathing and respiratory function stable Cardiovascular status: blood pressure returned to baseline and stable Postop Assessment: no headache and no apparent nausea or vomiting Anesthetic complications: no Comments: Late entry   No notable events documented.   Last Vitals:  Vitals:   08/02/24 0845 08/02/24 1100  BP: 134/63 (!) 101/51  Pulse:  62  Resp: 16 17  Temp: 36.8 C 36.6 C  SpO2: 99% 98%    Last Pain:  Vitals:   08/02/24 1100  TempSrc: Oral  PainSc: 0-No pain                 Yvonna PARAS Clover Feehan      "

## 2024-08-04 NOTE — Progress Notes (Signed)
 Patient came in he could not unwrap his dressing I unwrapped him and gave him a large bandaid, he is scheduled for his post op appointment already

## 2024-08-17 ENCOUNTER — Encounter: Admitting: Orthopedic Surgery

## 2024-08-18 ENCOUNTER — Encounter: Payer: Self-pay | Admitting: Orthopedic Surgery

## 2024-08-18 ENCOUNTER — Ambulatory Visit: Admitting: Orthopedic Surgery

## 2024-08-18 DIAGNOSIS — M65341 Trigger finger, right ring finger: Secondary | ICD-10-CM

## 2024-08-18 NOTE — Progress Notes (Signed)
"  ° °  Patient ID: [name], male   DOB: August 20, 1945, 79 y.o.   MRN: [id number]    POST OP VISIT  POD # 1   Patient: [name]           Date of Birth: [birth year]           MRN: [id number] Visit Date: 08/18/2024 Requested by: Edsel Devere SAUNDERS, PA-C [address] [facility] [address]. [place],  [place] [id number] PCP: Shona Norleen PEDLAR, MD  Chief Complaint  Patient presents with   Post-op Follow-up    Right TF release     Encounter Diagnosis  Name Primary?   Trigger finger, right ring finger s/p relese on 08/02/24 Yes    PROCEDURE: Finger release right ring finger  SUBJECTIVE:   No complaints of pain, no patient says he could not make a fist before surgery  EXAM FINDINGS:   INCISION: no sign of infection, we applied some skin glue  Decreased finger flexion but patient notes he is back to his normal amount of bending and his fingers   Assessment and plan:  Doing well after right ring finger trigger finger release use gloves for a week while washing dishes  prn   "

## 2024-08-18 NOTE — Progress Notes (Signed)
" ° ° °  08/18/2024   Chief Complaint  Patient presents with   Post-op Follow-up    Right TF release     Encounter Diagnosis  Name Primary?   Trigger finger, right ring finger s/p relese on 08/02/24 Yes    What pharmacy do you use ? ___________________________  DOI/DOS/ Date: 08/02/24  Did you get better, worse or no change (Answer below)   Improved      "
